# Patient Record
Sex: Male | Born: 1940
Health system: Southern US, Community
[De-identification: ages and names within clinical notes are randomized; demographics above are authoritative.]

## PROBLEM LIST (undated history)

## (undated) DIAGNOSIS — N529 Male erectile dysfunction, unspecified: Secondary | ICD-10-CM

## (undated) DIAGNOSIS — I447 Left bundle-branch block, unspecified: Secondary | ICD-10-CM

## (undated) DIAGNOSIS — Z9189 Other specified personal risk factors, not elsewhere classified: Secondary | ICD-10-CM

## (undated) DIAGNOSIS — I10 Essential (primary) hypertension: Secondary | ICD-10-CM

## (undated) DIAGNOSIS — R361 Hematospermia: Secondary | ICD-10-CM

## (undated) DIAGNOSIS — M545 Low back pain, unspecified: Secondary | ICD-10-CM

## (undated) DIAGNOSIS — N138 Other obstructive and reflux uropathy: Secondary | ICD-10-CM

## (undated) DIAGNOSIS — K648 Other hemorrhoids: Secondary | ICD-10-CM

## (undated) DIAGNOSIS — F329 Major depressive disorder, single episode, unspecified: Secondary | ICD-10-CM

## (undated) DIAGNOSIS — I2510 Atherosclerotic heart disease of native coronary artery without angina pectoris: Secondary | ICD-10-CM

## (undated) DIAGNOSIS — N401 Enlarged prostate with lower urinary tract symptoms: Secondary | ICD-10-CM

## (undated) DIAGNOSIS — C61 Malignant neoplasm of prostate: Secondary | ICD-10-CM

## (undated) DIAGNOSIS — K635 Polyp of colon: Secondary | ICD-10-CM

## (undated) DIAGNOSIS — F32A Depression, unspecified: Secondary | ICD-10-CM

## (undated) DIAGNOSIS — Q6101 Congenital single renal cyst: Secondary | ICD-10-CM

## (undated) DIAGNOSIS — Z86718 Personal history of other venous thrombosis and embolism: Secondary | ICD-10-CM

## (undated) DIAGNOSIS — K5792 Diverticulitis of intestine, part unspecified, without perforation or abscess without bleeding: Secondary | ICD-10-CM

## (undated) DIAGNOSIS — K220 Achalasia of cardia: Secondary | ICD-10-CM

## (undated) DIAGNOSIS — I6522 Occlusion and stenosis of left carotid artery: Secondary | ICD-10-CM

## (undated) DIAGNOSIS — Z9889 Other specified postprocedural states: Secondary | ICD-10-CM

## (undated) DIAGNOSIS — R351 Nocturia: Secondary | ICD-10-CM

## (undated) DIAGNOSIS — Z973 Presence of spectacles and contact lenses: Secondary | ICD-10-CM

## (undated) HISTORY — DX: Low back pain: M54.5

## (undated) HISTORY — DX: Diverticulitis of intestine, part unspecified, without perforation or abscess without bleeding: K57.92

## (undated) HISTORY — PX: HERNIA REPAIR: SHX51

## (undated) HISTORY — PX: COLONOSCOPY: SHX174

## (undated) HISTORY — DX: Essential (primary) hypertension: I10

## (undated) HISTORY — PX: CARDIOVASCULAR STRESS TEST: SHX262

## (undated) HISTORY — DX: Low back pain, unspecified: M54.50

## (undated) HISTORY — DX: Polyp of colon: K63.5

## (undated) HISTORY — DX: Depression, unspecified: F32.A

## (undated) HISTORY — DX: Hematospermia: R36.1

## (undated) HISTORY — DX: Atherosclerotic heart disease of native coronary artery without angina pectoris: I25.10

## (undated) HISTORY — DX: Major depressive disorder, single episode, unspecified: F32.9

## (undated) HISTORY — DX: Other hemorrhoids: K64.8

## (undated) HISTORY — DX: Malignant neoplasm of prostate: C61

---

## 2001-01-29 DIAGNOSIS — K648 Other hemorrhoids: Secondary | ICD-10-CM

## 2001-01-29 HISTORY — DX: Other hemorrhoids: K64.8

## 2001-04-18 ENCOUNTER — Ambulatory Visit (HOSPITAL_COMMUNITY): Admission: RE | Admit: 2001-04-18 | Discharge: 2001-04-18 | Payer: Self-pay | Admitting: Internal Medicine

## 2001-05-21 ENCOUNTER — Encounter: Payer: Self-pay | Admitting: General Surgery

## 2001-05-21 ENCOUNTER — Encounter: Admission: RE | Admit: 2001-05-21 | Discharge: 2001-05-21 | Payer: Self-pay | Admitting: General Surgery

## 2001-06-13 ENCOUNTER — Encounter: Payer: Self-pay | Admitting: General Surgery

## 2001-06-16 ENCOUNTER — Encounter: Payer: Self-pay | Admitting: General Surgery

## 2001-06-16 HISTORY — PX: LAPAROSCOPIC HELLER MYOTOMY: SUR780

## 2001-06-17 ENCOUNTER — Inpatient Hospital Stay (HOSPITAL_COMMUNITY): Admission: RE | Admit: 2001-06-17 | Discharge: 2001-06-18 | Payer: Self-pay | Admitting: General Surgery

## 2001-06-17 ENCOUNTER — Encounter: Payer: Self-pay | Admitting: General Surgery

## 2004-02-18 ENCOUNTER — Ambulatory Visit: Payer: Self-pay | Admitting: Internal Medicine

## 2004-06-30 ENCOUNTER — Ambulatory Visit: Payer: Self-pay | Admitting: Internal Medicine

## 2004-10-04 ENCOUNTER — Ambulatory Visit: Payer: Self-pay | Admitting: Internal Medicine

## 2004-10-10 ENCOUNTER — Ambulatory Visit: Payer: Self-pay | Admitting: Internal Medicine

## 2004-10-24 ENCOUNTER — Ambulatory Visit: Payer: Self-pay | Admitting: *Deleted

## 2004-11-29 ENCOUNTER — Ambulatory Visit: Payer: Self-pay | Admitting: Internal Medicine

## 2005-03-06 ENCOUNTER — Ambulatory Visit: Payer: Self-pay | Admitting: Internal Medicine

## 2005-03-14 ENCOUNTER — Ambulatory Visit (HOSPITAL_COMMUNITY): Admission: RE | Admit: 2005-03-14 | Discharge: 2005-03-14 | Payer: Self-pay | Admitting: Internal Medicine

## 2005-10-26 ENCOUNTER — Ambulatory Visit: Payer: Self-pay | Admitting: Internal Medicine

## 2005-11-11 HISTORY — PX: UMBILICAL HERNIA REPAIR: SHX196

## 2005-12-10 ENCOUNTER — Encounter: Admission: RE | Admit: 2005-12-10 | Discharge: 2005-12-10 | Payer: Self-pay | Admitting: General Surgery

## 2005-12-13 ENCOUNTER — Ambulatory Visit (HOSPITAL_BASED_OUTPATIENT_CLINIC_OR_DEPARTMENT_OTHER): Admission: RE | Admit: 2005-12-13 | Discharge: 2005-12-13 | Payer: Self-pay | Admitting: General Surgery

## 2006-03-06 ENCOUNTER — Ambulatory Visit: Payer: Self-pay | Admitting: Internal Medicine

## 2006-09-10 ENCOUNTER — Ambulatory Visit: Payer: Self-pay | Admitting: Endocrinology

## 2006-09-11 ENCOUNTER — Ambulatory Visit: Payer: Self-pay | Admitting: Endocrinology

## 2006-09-25 ENCOUNTER — Ambulatory Visit: Payer: Self-pay | Admitting: Internal Medicine

## 2006-11-28 ENCOUNTER — Telehealth: Payer: Self-pay | Admitting: Internal Medicine

## 2007-02-19 ENCOUNTER — Ambulatory Visit: Payer: Self-pay | Admitting: Internal Medicine

## 2007-02-19 DIAGNOSIS — N400 Enlarged prostate without lower urinary tract symptoms: Secondary | ICD-10-CM

## 2007-02-19 DIAGNOSIS — I10 Essential (primary) hypertension: Secondary | ICD-10-CM | POA: Insufficient documentation

## 2007-02-19 DIAGNOSIS — M545 Low back pain, unspecified: Secondary | ICD-10-CM | POA: Insufficient documentation

## 2007-03-25 ENCOUNTER — Ambulatory Visit: Payer: Self-pay | Admitting: Internal Medicine

## 2007-03-25 DIAGNOSIS — K649 Unspecified hemorrhoids: Secondary | ICD-10-CM

## 2007-03-25 DIAGNOSIS — K921 Melena: Secondary | ICD-10-CM

## 2007-04-01 ENCOUNTER — Ambulatory Visit: Payer: Self-pay | Admitting: Internal Medicine

## 2007-04-11 ENCOUNTER — Ambulatory Visit: Payer: Self-pay | Admitting: Endocrinology

## 2007-04-11 DIAGNOSIS — R21 Rash and other nonspecific skin eruption: Secondary | ICD-10-CM

## 2007-04-18 ENCOUNTER — Telehealth (INDEPENDENT_AMBULATORY_CARE_PROVIDER_SITE_OTHER): Payer: Self-pay | Admitting: *Deleted

## 2007-04-18 ENCOUNTER — Ambulatory Visit: Payer: Self-pay | Admitting: Internal Medicine

## 2007-04-18 ENCOUNTER — Encounter: Payer: Self-pay | Admitting: Internal Medicine

## 2007-04-22 ENCOUNTER — Ambulatory Visit: Payer: Self-pay | Admitting: Internal Medicine

## 2007-04-28 ENCOUNTER — Ambulatory Visit: Payer: Self-pay | Admitting: Internal Medicine

## 2007-04-28 DIAGNOSIS — F411 Generalized anxiety disorder: Secondary | ICD-10-CM | POA: Insufficient documentation

## 2007-04-29 ENCOUNTER — Ambulatory Visit: Payer: Self-pay | Admitting: Internal Medicine

## 2007-04-30 LAB — CONVERTED CEMR LAB
Basophils Relative: 1.2 % — ABNORMAL HIGH (ref 0.0–1.0)
CO2: 30 meq/L (ref 19–32)
Chloride: 106 meq/L (ref 96–112)
Cholesterol: 137 mg/dL (ref 0–200)
GFR calc Af Amer: 96 mL/min
GFR calc non Af Amer: 80 mL/min
Glucose, Bld: 98 mg/dL (ref 70–99)
HCT: 43.9 % (ref 39.0–52.0)
HDL: 27.9 mg/dL — ABNORMAL LOW (ref >39.0)
LDL Cholesterol: 93 mg/dL (ref 0–99)
Lymphocytes Relative: 36.9 % (ref 12.0–46.0)
Monocytes Relative: 10.4 % (ref 3.0–12.0)
Neutro Abs: 2.6 10*3/uL (ref 1.4–7.7)
RBC: 5.44 M/uL (ref 4.22–5.81)
RDW: 13.1 % (ref 11.5–14.6)
Total CHOL/HDL Ratio: 4.9

## 2007-05-05 ENCOUNTER — Telehealth: Payer: Self-pay | Admitting: Internal Medicine

## 2007-05-06 ENCOUNTER — Encounter: Payer: Self-pay | Admitting: Internal Medicine

## 2007-09-18 ENCOUNTER — Telehealth: Payer: Self-pay | Admitting: Internal Medicine

## 2008-05-10 ENCOUNTER — Encounter: Payer: Self-pay | Admitting: Internal Medicine

## 2008-06-29 ENCOUNTER — Ambulatory Visit: Payer: Self-pay | Admitting: Internal Medicine

## 2008-06-29 DIAGNOSIS — M542 Cervicalgia: Secondary | ICD-10-CM | POA: Insufficient documentation

## 2008-06-29 DIAGNOSIS — M25569 Pain in unspecified knee: Secondary | ICD-10-CM

## 2008-07-14 ENCOUNTER — Encounter: Payer: Self-pay | Admitting: Internal Medicine

## 2008-09-08 ENCOUNTER — Encounter: Payer: Self-pay | Admitting: Internal Medicine

## 2009-02-07 ENCOUNTER — Ambulatory Visit: Payer: Self-pay | Admitting: Internal Medicine

## 2009-02-07 DIAGNOSIS — IMO0001 Reserved for inherently not codable concepts without codable children: Secondary | ICD-10-CM

## 2009-02-09 LAB — CONVERTED CEMR LAB
ALT: 14 units/L (ref 0–53)
AST: 13 units/L (ref 0–37)
Basophils Relative: 0 % (ref 0.0–3.0)
Chloride: 107 meq/L (ref 96–112)
Creatinine, Ser: 0.9 mg/dL (ref 0.4–1.5)
Eosinophils Relative: 3 % (ref 0.0–5.0)
GFR calc non Af Amer: 108.29 mL/min (ref >60)
Glucose, Bld: 77 mg/dL (ref 70–99)
HCT: 45.2 % (ref 39.0–52.0)
Hemoglobin, Urine: NEGATIVE
Ketones, ur: NEGATIVE mg/dL
Lymphocytes Relative: 20 % (ref 12.0–46.0)
MCHC: 31.8 g/dL (ref 30.0–36.0)
MCV: 83.4 fL (ref 78.0–100.0)
RBC: 5.41 M/uL (ref 4.22–5.81)
Specific Gravity, Urine: >=1.03 (ref 1.000–1.030)
Total Bilirubin: 0.8 mg/dL (ref 0.3–1.2)
Total Protein: 7.7 g/dL (ref 6.0–8.3)
Urine Glucose: NEGATIVE mg/dL
Urobilinogen, UA: 0.2 (ref 0.0–1.0)

## 2009-03-02 ENCOUNTER — Encounter: Payer: Self-pay | Admitting: Internal Medicine

## 2009-09-19 ENCOUNTER — Encounter: Payer: Self-pay | Admitting: Internal Medicine

## 2010-01-26 ENCOUNTER — Ambulatory Visit
Admission: RE | Admit: 2010-01-26 | Discharge: 2010-01-26 | Payer: Self-pay | Source: Home / Self Care | Attending: Internal Medicine | Admitting: Internal Medicine

## 2010-01-26 ENCOUNTER — Encounter: Payer: Self-pay | Admitting: Internal Medicine

## 2010-01-26 DIAGNOSIS — J019 Acute sinusitis, unspecified: Secondary | ICD-10-CM

## 2010-02-28 NOTE — Assessment & Plan Note (Signed)
Summary: cough,achy/cd   Vital Signs:  Patient profile:   70 year old male Weight:      229 pounds Temp:     98.5 degrees F oral Pulse rate:   66 / minute BP sitting:   164 / 86  (left arm)  Vitals Entered By: Tora Perches (February 07, 2009 1:39 PM) CC: cough, aches Is Patient Diabetic? No   CC:  cough and aches.  History of Present Illness: The patient presents with complaints of HA, LBP, nosore throat. C/o low grade fever, Muscle aches are present x 3-4 d.   Preventive Screening-Counseling & Management  Alcohol-Tobacco     Smoking Status: never  Current Medications (verified): 1)  Loratadine 10 Mg  Tabs (Loratadine) .... Once Daily As Needed Allergies 2)  Lorazepam 0.5 Mg Tabs (Lorazepam) .Marland Kitchen.. 1 By Mouth Two Times A Day As Needed Anxiety 3)  Triamcinolone Acetonide 0.5 %  Crea (Triamcinolone Acetonide) .Marland Kitchen.. 1 By Mouth Two Times A Day As Needed For Rash 4)  Hytrin 5 Mg  Caps (Terazosin Hcl) .Marland Kitchen.. 1 By Mouth Qd 5)  Ibuprofen 600 Mg  Tabs (Ibuprofen) .Marland Kitchen.. 1 By Mouth Two Times A Day Pc X 2 Wks Then Prn  Allergies: 1)  ! * Iv Dye and All Other Dye 's  Past History:  Past Medical History: Last updated: 04/28/2007 Achalasia Hypertension Low back pain Benign prostatic hypertrophy Umbil. hernia Int hem.  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  Lungs:  Normal respiratory effort, chest expands symmetrically. Lungs are clear to auscultation, no crackles or wheezes. Heart:  Normal rate and regular rhythm. S1 and S2 normal without gallop, murmur, click, rub or other extra sounds. Abdomen:  Bowel sounds  positive,abdomen soft and non-tender without masses, organomegaly or hernias noted. Msk:  Lumbar-sacral spine is tender to palpation over paraspinal muscles and painfull with the ROM  Neurologic:  No cranial nerve deficits noted. Station and gait are normal. Plantar reflexes are down-going bilaterally. DTRs are symmetrical throughout. Sensory, motor and coordinative functions appear intact. Skin:  Intact without suspicious lesions or rashes Psych:  Cognition and judgment appear intact. Alert and cooperative with normal attention span and concentration. No apparent delusions, illusions, hallucinations   Impression & Recommendations:  Problem # 1:  MYALGIA (ICD-729.1) ?viral Assessment New  His updated medication list for this problem includes:    Ibuprofen 600 Mg Tabs (Ibuprofen) .Marland Kitchen... 1 by mouth two times a day pc x 2 wks then prn    Tramadol Hcl 50 Mg Tabs (Tramadol hcl) .Marland Kitchen... 1-2 tabs by mouth two times a day as needed pain  Orders: TLB-Udip ONLY (81003-UDIP) TLB-BMP (Basic Metabolic Panel-BMET) (80048-METABOL) TLB-Hepatic/Liver Function Pnl (80076-HEPATIC) TLB-CBC Platelet - w/Differential (85025-CBCD)  Problem # 2:  LOW BACK PAIN (ICD-724.2) r/o UTI  His updated medication list for this problem includes:    Ibuprofen 600 Mg Tabs (Ibuprofen) .Marland Kitchen... 1 by mouth two times a day pc x 2 wks then prn    Tramadol Hcl 50 Mg Tabs (Tramadol hcl) .Marland Kitchen... 1-2 tabs by mouth two times a day as needed pain  Orders: TLB-Udip ONLY (81003-UDIP) TLB-BMP (Basic Metabolic Panel-BMET) (80048-METABOL) TLB-Hepatic/Liver Function Pnl (80076-HEPATIC) TLB-CBC Platelet - w/Differential (85025-CBCD)  Problem #  3:  Poss. sinusitis Assessment: New Ceftin  Complete Medication List: 1)  Loratadine 10 Mg Tabs (Loratadine) .... Once daily as needed allergies 2)  Lorazepam 0.5 Mg Tabs (Lorazepam) .Marland Kitchen.. 1 by mouth two times a day as needed anxiety 3)  Triamcinolone Acetonide 0.5 % Crea (Triamcinolone acetonide)  .Marland Kitchen.. 1 by mouth two times a day as needed for rash 4)  Hytrin 5 Mg Caps (Terazosin hcl) .Marland Kitchen.. 1 by mouth qd 5)  Ibuprofen 600 Mg Tabs (Ibuprofen) .Marland Kitchen.. 1 by mouth two times a day pc x 2 wks then prn 6)  Ceftin 500 Mg Tabs (Cefuroxime axetil) .Marland Kitchen.. 1 by mouth bid 7)  Tramadol Hcl 50 Mg Tabs (Tramadol hcl) .Marland Kitchen.. 1-2 tabs by mouth two times a day as needed pain  Patient Instructions: 1)  Call if you are not better in a reasonable amount of time or if worse.  Prescriptions: TRAMADOL HCL 50 MG TABS (TRAMADOL HCL) 1-2 tabs by mouth two times a day as needed pain  #60 x 1   Entered and Authorized by:   Tresa Garter MD   Signed by:   Tresa Garter MD on 02/07/2009   Method used:   Electronically to        [facility] [address] [phone]* (retail)       [address]       [place], [place]  [address]       Ph: [phone] or [phone]       Fax: [phone]   RxID:   [id number] CEFTIN 500 MG TABS (CEFUROXIME AXETIL) 1 by mouth bid  #20 x 1   Entered and Authorized by:   Tresa Garter MD   Signed by:   Tresa Garter MD on 02/07/2009   Method used:   Electronically to        [facility] [address] [phone]* (retail)       [address]       [place], [place]  [address]       Ph: [phone] or [phone]       Fax: [phone]   RxID:   [id number]

## 2010-02-28 NOTE — Letter (Signed)
Summary: [facility]  [facility]   Imported By: Sherian Rein 03/08/2009 14:51:05  _____________________________________________________________________  External Attachment:    Type:   Image     Comment:   External Document

## 2010-02-28 NOTE — Letter (Signed)
Summary: [facility]   Imported By: Sherian Rein 09/27/2009 13:33:57  _____________________________________________________________________  External Attachment:    Type:   Image     Comment:   External Document

## 2010-03-02 NOTE — Assessment & Plan Note (Signed)
Summary: cough/cold worsening/plot full/cd   Vital Signs:  Patient profile:   70 year old male Height:      70 inches (177.80 cm) Weight:      230.50 pounds (104.77 kg) BMI:     33.19 O2 Sat:      94 % on Room air Temp:     98.9 degrees F (37.17 degrees C) oral Pulse rate:   71 / minute Pulse rhythm:   regular Resp:     16 per minute BP sitting:   142 / 80  (left arm) Cuff size:   large  Vitals Entered By: Brenton Grills CMA Duncan Dull) (January 26, 2010 10:34 AM)  Nutrition Counseling: Patient's BMI is greater than 25 and therefore counseled on weight management options.  O2 Flow:  Room air CC: Cold, congestion x 5 days/aj, URI symptoms Is Patient Diabetic? No Pain Assessment Patient in pain? no        Primary Care Provider:  Georgina Quint Plotnikov MD  CC:  Cold, congestion x 5 days/aj, and URI symptoms.  History of Present Illness:  URI Symptoms      This is a 70 year old man who presents with URI symptoms.  The symptoms began 1 week ago.  The severity is described as mild.  The patient reports nasal congestion, purulent nasal discharge, sore throat, dry cough, and sick contacts, but denies clear nasal discharge, productive cough, and earache.  The patient denies fever, stiff neck, dyspnea, wheezing, rash, vomiting, diarrhea, use of an antipyretic, and response to antipyretic.  The patient also reports headache.  The patient denies itchy throat, sneezing, seasonal symptoms, muscle aches, and severe fatigue.  Risk factors for Strep sinusitis include unilateral facial pain, unilateral nasal discharge, poor response to decongestant, and double sickening.  The patient denies the following risk factors for Strep sinusitis: tooth pain, Strep exposure, tender adenopathy, and absence of cough.    Preventive Screening-Counseling & Management  Alcohol-Tobacco     Alcohol drinks/day: 0     Alcohol Counseling: not indicated; patient does not drink     Smoking Status: never     Tobacco  Counseling: not indicated; no tobacco use  Caffeine-Diet-Exercise     Does Patient Exercise: no  Hep-HIV-STD-Contraception     Hepatitis Risk: no risk noted     HIV Risk: no risk noted     STD Risk: no risk noted      Sexual History:  currently monogamous.        Drug Use:  no.        Blood Transfusions:  no.    Clinical Review Panels:  Prevention   Last PSA:  1.70 (04/29/2007)  Lipid Management   Cholesterol:  137 (04/29/2007)   LDL (bad choesterol):  93 (04/29/2007)   HDL (good cholesterol):  27.9 (04/29/2007)  Diabetes Management   Creatinine:  0.9 (02/07/2009)  CBC   WBC:  9.0 (02/07/2009)   RBC:  5.41 (02/07/2009)   Hgb:  14.4 (02/07/2009)   Hct:  45.2 (02/07/2009)   Platelets:  222.0 (02/07/2009)   MCV  83.4 (02/07/2009)   MCHC  31.8 (02/07/2009)   RDW  13.5 (02/07/2009)   PMN:  70.0 (02/07/2009)   Lymphs:  20.0 (02/07/2009)   Monos:  7.0 (02/07/2009)   Eosinophils:  3.0 (02/07/2009)   Basophil:  0.0 H % (02/07/2009)  Complete Metabolic Panel   Glucose:  77 (02/07/2009)   Sodium:  141 (02/07/2009)   Potassium:  4.0 (02/07/2009)   Chloride:  107 (02/07/2009)   CO2:  29 (02/07/2009)   BUN:  9 (02/07/2009)   Creatinine:  0.9 (02/07/2009)   Albumin:  3.6 (02/07/2009)   Total Protein:  7.7 (02/07/2009)   Calcium:  9.0 (02/07/2009)   Total Bili:  0.8 (02/07/2009)   Alk Phos:  69 (02/07/2009)   SGPT (ALT):  14 (02/07/2009)   SGOT (AST):  13 (02/07/2009)   Medications Prior to Update: 1)  Loratadine 10 Mg  Tabs (Loratadine) .... Once Daily As Needed Allergies 2)  Lorazepam 0.5 Mg Tabs (Lorazepam) .Marland Kitchen.. 1 By Mouth Two Times A Day As Needed Anxiety 3)  Triamcinolone Acetonide 0.5 %  Crea (Triamcinolone Acetonide) .Marland Kitchen.. 1 By Mouth Two Times A Day As Needed For Rash 4)  Hytrin 5 Mg  Caps (Terazosin Hcl) .Marland Kitchen.. 1 By Mouth Qd 5)  Ibuprofen 600 Mg  Tabs (Ibuprofen) .Marland Kitchen.. 1 By Mouth Two Times A Day Pc X 2 Wks Then Prn 6)  Tramadol Hcl 50 Mg Tabs (Tramadol Hcl) .Marland Kitchen..  1-2 Tabs By Mouth Two Times A Day As Needed Pain  Current Medications (verified): 1)  Loratadine 10 Mg  Tabs (Loratadine) .... Once Daily As Needed Allergies 2)  Lorazepam 0.5 Mg Tabs (Lorazepam) .Marland Kitchen.. 1 By Mouth Two Times A Day As Needed Anxiety 3)  Triamcinolone Acetonide 0.5 %  Crea (Triamcinolone Acetonide) .Marland Kitchen.. 1 By Mouth Two Times A Day As Needed For Rash 4)  Hytrin 5 Mg  Caps (Terazosin Hcl) .Marland Kitchen.. 1 By Mouth Qd 5)  Ibuprofen 600 Mg  Tabs (Ibuprofen) .Marland Kitchen.. 1 By Mouth Two Times A Day Pc X 2 Wks Then Prn 6)  Tramadol Hcl 50 Mg Tabs (Tramadol Hcl) .Marland Kitchen.. 1-2 Tabs By Mouth Two Times A Day As Needed Pain 7)  Guiatuss Ac 100-10 Mg/27ml Syrp (Guaifenesin-Codeine) .... 5-10 Ml By Mouth Qid As Needed For Cough 8)  Ceftin 500 Mg Tab (Cefuroxime Axetil) .... Take One (1) Tablet By Mouth Two (2) Times A Day X 10 Days  Allergies (verified): 1)  ! * Iv Dye and All Other Dye 's  Past History:  Past Medical History: Last updated: 04/28/2007 Achalasia Hypertension Low back pain Benign prostatic hypertrophy Umbil. hernia Int hem.  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 Family History Hypertension  Social History: Reviewed history from 02/19/2007 and no changes required. 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    Physical Exam  General:  alert, well-developed, well-nourished, well-hydrated, appropriate dress, normal appearance, healthy-appearing, cooperative to examination, and good hygiene.   Head:  normocephalic, atraumatic, no abnormalities observed, and no abnormalities palpated.   Eyes:  vision grossly intact, pupils equal, pupils round, and pupils reactive to light.   Ears:  R ear normal  and L ear normal.   Nose:  no external deformity, no mucosal pallor, no mucosal edema, no airflow obstruction, no intranasal foreign body, no nasal polyps, no nasal mucosal lesions, no mucosal friability, no active bleeding or clots, no septum abnormalities, nasal dischargemucosal pallor, mucosal erythema, L maxillary sinus tenderness, and R maxillary sinus tenderness.   Mouth:  good dentition, pharynx pink and moist, no exudates, no posterior lymphoid hypertrophy, no postnasal drip, no pharyngeal crowing, no lesions, no aphthous ulcers, no erosions, no tongue abnormalities, no leukoplakia, no petechiae, and pharyngeal erythema.   Neck:  supple, full ROM, no masses, no thyromegaly, no thyroid nodules or tenderness, no JVD, no carotid bruits, no cervical lymphadenopathy, and no neck tenderness.   Lungs:  normal respiratory effort, no intercostal retractions, no accessory muscle use, normal breath sounds, no dullness, no fremitus, no crackles, and no wheezes.   Heart:  normal rate, regular rhythm, no murmur, no gallop, no rub, and no JVD.   Abdomen:  soft, non-tender, normal bowel sounds, no distention, no masses, no guarding, no rigidity, no rebound tenderness, no abdominal hernia, no inguinal hernia, no hepatomegaly, and no splenomegaly.   Msk:  No deformity or scoliosis noted  of thoracic or lumbar spine.   Pulses:  R and L carotid,radial,femoral,dorsalis pedis and posterior tibial pulses are full and equal bilaterally Extremities:  No clubbing, cyanosis, edema, or deformity noted with normal full range of motion of all joints.   Neurologic:  No cranial nerve deficits noted. Station and gait are normal. Plantar reflexes are down-going bilaterally. DTRs are symmetrical throughout. Sensory, motor and coordinative functions appear intact. Skin:  turgor normal, color normal, no rashes, no suspicious lesions, no ecchymoses, no petechiae, no purpura, no ulcerations, and no edema.   Cervical Nodes:  no anterior cervical adenopathy and no posterior cervical adenopathy.   Axillary Nodes:  no R axillary adenopathy and no L axillary adenopathy.   Psych:  Cognition and judgment appear intact. Alert and cooperative with normal attention span and concentration. No apparent delusions, illusions, hallucinations   Impression & Recommendations:  Problem # 1:  SINUSITIS- ACUTE-NOS (ICD-461.9) Assessment New  His updated medication list for this problem includes:    Guiatuss Ac 100-10 Mg/52ml Syrp (Guaifenesin-codeine) .Marland Kitchen... 5-10 ml by mouth qid as needed for cough    Ceftin 500 Mg Tab (Cefuroxime axetil) .Marland Kitchen... Take one (1) tablet by mouth two (2) times a day x 10 days  Instructed on treatment. Call if symptoms persist or worsen.   Problem # 2:  HYPERTENSION (ICD-401.9) Assessment: Unchanged  His updated medication list for this problem includes:    Hytrin 5 Mg Caps (Terazosin hcl) .Marland Kitchen... 1 by mouth qd  BP today: 142/80 Prior BP: 164/86 (02/07/2009)  Labs Reviewed: K+: 4.0 (02/07/2009) Creat: : 0.9 (02/07/2009)   Chol: 137 (04/29/2007)   HDL: 27.9 (04/29/2007)   LDL: 93 (04/29/2007)   TG: 79 (04/29/2007)  Complete Medication List: 1)  Loratadine 10 Mg Tabs (Loratadine) .... Once daily as needed allergies 2)  Lorazepam 0.5 Mg Tabs (Lorazepam) .Marland Kitchen.. 1 by mouth two times a day  as needed anxiety 3)  Triamcinolone Acetonide 0.5 % Crea (Triamcinolone acetonide) .Marland Kitchen.. 1 by mouth two times a day as needed for rash 4)  Hytrin 5 Mg Caps (Terazosin hcl) .Marland Kitchen.. 1 by mouth qd 5)  Ibuprofen 600 Mg Tabs (Ibuprofen) .Marland Kitchen.. 1 by mouth two times a day pc x 2 wks then prn 6)  Tramadol Hcl 50 Mg Tabs (Tramadol hcl) .Marland KitchenMarland KitchenMarland Kitchen  1-2 tabs by mouth two times a day as needed pain 7)  Guiatuss Ac 100-10 Mg/6ml Syrp (Guaifenesin-codeine) .... 5-10 ml by mouth qid as needed for cough 8)  Ceftin 500 Mg Tab (Cefuroxime axetil) .... Take one (1) tablet by mouth two (2) times a day x 10 days  Patient Instructions: 1)  Please schedule a follow-up appointment in 1 month. 2)  Check your Blood Pressure regularly. If it is above 140/90: you should make an appointment. 3)  Take your antibiotic as prescribed until ALL of it is gone, but stop if you develop a rash or swelling and contact our office as soon as possible. 4)  Acute sinusitis symptoms for less than 10 days are not helped by antibiotics.Use warm moist compresses, and over the counter decongestants ( only as directed). Call if no improvement in 5-7 days, sooner if increasing pain, fever, or new symptoms. Prescriptions: CEFTIN 500 MG TAB (CEFUROXIME AXETIL) Take one (1) tablet by mouth two (2) times a day X 10 days  #20 x 1   Entered and Authorized by:   Etta Grandchild MD   Signed by:   Etta Grandchild MD on 01/26/2010   Method used:   Print then Give to Patient   RxID:   [id number] GUIATUSS AC 100-10 MG/5ML SYRP (GUAIFENESIN-CODEINE) 5-10 ml by mouth QID as needed for cough  #8 ounces x 1   Entered and Authorized by:   Etta Grandchild MD   Signed by:   Etta Grandchild MD on 01/26/2010   Method used:   Print then Give to Patient   RxID:   [id number]    Orders Added: 1)  Est. Patient Level IV [01601]

## 2010-03-02 NOTE — Letter (Signed)
Summary: Out of Work  [facility]  [address] [place], [place] [address]   Phone: [phone]  Fax: [phone]    January 26, 2010   Employee:  [employer]    To Whom It May Concern:   For Medical reasons, please excuse the above named employee from work for the following dates:  Start:   01/26/10  End:   01/30/10  If you need additional information, please feel free to contact our office.         Sincerely,    Etta Grandchild MD

## 2010-03-29 ENCOUNTER — Encounter: Payer: Self-pay | Admitting: Internal Medicine

## 2010-04-11 NOTE — Letter (Signed)
[facility]: [facility]   Imported By: Sherian Rein 04/03/2010 14:12:22  _____________________________________________________________________  External Attachment:    Type:   Image     Comment:   External Document

## 2010-04-14 ENCOUNTER — Ambulatory Visit (INDEPENDENT_AMBULATORY_CARE_PROVIDER_SITE_OTHER): Payer: BC Managed Care – PPO | Admitting: Internal Medicine

## 2010-04-14 ENCOUNTER — Encounter: Payer: Self-pay | Admitting: Internal Medicine

## 2010-04-14 DIAGNOSIS — I10 Essential (primary) hypertension: Secondary | ICD-10-CM

## 2010-04-14 DIAGNOSIS — M79609 Pain in unspecified limb: Secondary | ICD-10-CM

## 2010-04-14 DIAGNOSIS — R609 Edema, unspecified: Secondary | ICD-10-CM | POA: Insufficient documentation

## 2010-04-18 ENCOUNTER — Other Ambulatory Visit: Payer: Self-pay | Admitting: Internal Medicine

## 2010-04-19 ENCOUNTER — Encounter (INDEPENDENT_AMBULATORY_CARE_PROVIDER_SITE_OTHER): Payer: BC Managed Care – PPO | Admitting: *Deleted

## 2010-04-19 ENCOUNTER — Ambulatory Visit: Payer: Self-pay

## 2010-04-19 ENCOUNTER — Other Ambulatory Visit: Payer: Self-pay | Admitting: Family Medicine

## 2010-04-19 DIAGNOSIS — M7989 Other specified soft tissue disorders: Secondary | ICD-10-CM

## 2010-04-19 DIAGNOSIS — M79609 Pain in unspecified limb: Secondary | ICD-10-CM

## 2010-04-24 ENCOUNTER — Telehealth: Payer: Self-pay | Admitting: Internal Medicine

## 2010-04-24 NOTE — Telephone Encounter (Signed)
I do not see the [name] report Pls give me dates and other info re: time off

## 2010-04-24 NOTE — Telephone Encounter (Signed)
Pt would like to get the results of the doppler scan and also speak to nurse about medical leave that was discussed w/MD.

## 2010-04-27 NOTE — Assessment & Plan Note (Signed)
Summary: LEG PAIN/#CD   Vital Signs:  Patient profile:   70 year old male Height:      70 inches Weight:      233 pounds BMI:     33.55 Temp:     98.1 degrees F oral Pulse rate:   76 / minute Pulse rhythm:   regular Resp:     16 per minute BP sitting:   152 / 88  (left arm) Cuff size:   large  Vitals Entered By: [name], CMA(AAMA) (April 14, 2010 8:13 AM) CC: tripped on wet pavement and injured Rt leg > 18mo ago. Worsening knee pain Is Patient Diabetic? No Comments pt states he only takes Hytrin regularly   Primary Care Provider:  Tresa Garter MD  CC:  tripped on wet pavement and injured Rt leg > 18mo ago. Worsening knee pain.  History of Present Illness: C/o HTN C/o R leg pain in R knee in the back after he fell 1.5 month ago - he slipped. No leg swelling.   Current Medications (verified): 1)  Loratadine 10 Mg  Tabs (Loratadine) .... Once Daily As Needed Allergies 2)  Lorazepam 0.5 Mg Tabs (Lorazepam) .Marland Kitchen.. 1 By Mouth Two Times A Day As Needed Anxiety 3)  Triamcinolone Acetonide 0.5 %  Crea (Triamcinolone Acetonide) .Marland Kitchen.. 1 By Mouth Two Times A Day As Needed For Rash 4)  Hytrin 5 Mg  Caps (Terazosin Hcl) .Marland Kitchen.. 1 By Mouth Qd 5)  Ibuprofen 600 Mg  Tabs (Ibuprofen) .Marland Kitchen.. 1 By Mouth Two Times A Day Pc X 2 Wks Then Prn 6)  Tramadol Hcl 50 Mg Tabs (Tramadol Hcl) .Marland Kitchen.. 1-2 Tabs By Mouth Two Times A Day As Needed Pain 7)  Guiatuss Ac 100-10 Mg/38ml Syrp (Guaifenesin-Codeine) .... 5-10 Ml By Mouth Qid As Needed For Cough  Allergies (verified): 1)  ! * Iv Dye and All Other Dye 's  Past History:  Past Medical History: Last updated: 04/28/2007 Achalasia Hypertension Low back pain Benign prostatic hypertrophy Umbil. hernia Int hem.  Colon 2003, 2009 Dr Marina Goodell  Social History: Last updated: 01/26/2010 Occupation: HR Married Never Smoked Alcohol use-no Drug use-no Regular exercise-no  Review of Systems  The patient denies anorexia, chest pain, syncope, dyspnea  on exertion, and abdominal pain.    Physical Exam  General:  alert, well-developed, well-nourished, well-hydrated, appropriate dress, normal appearance, healthy-appearing, cooperative to examination, and good hygiene.   Nose:  no external deformity, no mucosal pallor, no mucosal edema, no airflow obstruction, no intranasal foreign body, no nasal polyps, no nasal mucosal lesions, no mucosal friability, no active bleeding or clots, no septum abnormalities, nasal dischargemucosal pallor, mucosal erythema, L maxillary sinus tenderness, and R maxillary sinus tenderness.   Mouth:  good dentition, pharynx pink and moist, no exudates, no posterior lymphoid hypertrophy, no postnasal drip, no pharyngeal crowing, no lesions, no aphthous ulcers, no erosions, no tongue abnormalities, no leukoplakia, no petechiae, and pharyngeal erythema.   Lungs:  normal respiratory effort, no intercostal retractions, no accessory muscle use, normal breath sounds, no dullness, no fremitus, no crackles, and no wheezes.   Heart:  normal rate, regular rhythm, no murmur, no gallop, no rub, and no JVD.   Abdomen:  soft, non-tender, normal bowel sounds, no distention, no masses, no guarding, no rigidity, no rebound tenderness, no abdominal hernia, no inguinal hernia, no hepatomegaly, and no splenomegaly.   Msk:  No deformity or scoliosis noted of thoracic or lumbar spine.   Calves NT B Extremities:  No clubbing,  cyanosis, edema, or deformity noted with normal full range of motion of all joints.   Neurologic:  No cranial nerve deficits noted. Station and gait are normal. Plantar reflexes are down-going bilaterally. DTRs are symmetrical throughout. Sensory, motor and coordinative functions appear intact. Skin:  turgor normal, color normal, no rashes, no suspicious lesions, no ecchymoses, no petechiae, no purpura, no ulcerations, and no edema.     Impression & Recommendations:  Problem # 1:  KNEE PAIN (ICD-719.46) R Assessment  New  His updated medication list for this problem includes:    Ibuprofen 600 Mg Tabs (Ibuprofen) .Marland Kitchen... 1 by mouth two times a day pc x 2 wks then prn    Tramadol Hcl 50 Mg Tabs (Tramadol hcl) .Marland Kitchen... 1-2 tabs by mouth two times a day as needed pain    Ibuprofen 400 Mg Tabs (Ibuprofen) .Marland Kitchen... 1 by mouth two times a day pc prn Sonography Report: Indication:swelling and pain behind R knee Clinical bedside ultrasound was performed using Terason 3000 machine with a linear transducer.  The images were stored in Terason.  There was no Baker's cyst noted. Doppler revieled compressible tortuous veins Impression: no Baker's cyst  Problem # 2:  HYPERTENSION (ICD-401.9) Assessment: Deteriorated  His updated medication list for this problem includes:    Hytrin 5 Mg Caps (Terazosin hcl) .Marland Kitchen... 1 by mouth qd    Losartan Potassium 100 Mg Tabs (Losartan potassium) .Marland Kitchen... 1 by mouth once daily for blood pressure  Problem # 3:  STRESS DISORDER (ICD-V62.89) Assessment: Deteriorated Discussed  Complete Medication List: 1)  Loratadine 10 Mg Tabs (Loratadine) .... Once daily as needed allergies 2)  Lorazepam 0.5 Mg Tabs (Lorazepam) .Marland Kitchen.. 1 by mouth two times a day as needed anxiety 3)  Triamcinolone Acetonide 0.5 % Crea (Triamcinolone acetonide) .Marland Kitchen.. 1 by mouth two times a day as needed for rash 4)  Hytrin 5 Mg Caps (Terazosin hcl) .Marland Kitchen.. 1 by mouth qd 5)  Ibuprofen 600 Mg Tabs (Ibuprofen) .Marland Kitchen.. 1 by mouth two times a day pc x 2 wks then prn 6)  Tramadol Hcl 50 Mg Tabs (Tramadol hcl) .Marland Kitchen.. 1-2 tabs by mouth two times a day as needed pain 7)  Guiatuss Ac 100-10 Mg/64ml Syrp (Guaifenesin-codeine) .... 5-10 ml by mouth qid as needed for cough 8)  Losartan Potassium 100 Mg Tabs (Losartan potassium) .Marland Kitchen.. 1 by mouth once daily for blood pressure 9)  Ibuprofen 400 Mg Tabs (Ibuprofen) .Marland Kitchen.. 1 by mouth two times a day pc prn  Other Orders: Doppler Referral (Doppler) EMR Misc Charge Code [facility])  Patient  Instructions: 1)  Please schedule a follow-up appointment in 2 months. Prescriptions: IBUPROFEN 400 MG TABS (IBUPROFEN) 1 by mouth two times a day pc prn  #60 x 1   Entered and Authorized by:   Tresa Garter MD   Signed by:   Tresa Garter MD on 04/14/2010   Method used:   Print then Give to Patient   RxID:   [id number] LOSARTAN POTASSIUM 100 MG TABS (LOSARTAN POTASSIUM) 1 by mouth once daily for blood pressure  #30 x 12   Entered and Authorized by:   Tresa Garter MD   Signed by:   Tresa Garter MD on 04/14/2010   Method used:   Print then Give to Patient   RxID:   [id number]    Orders Added: 1)  Doppler Referral [Doppler] 2)  Est. Patient Level IV [54627] 3)  EMR Misc Charge Code [EMRMisc]

## 2010-04-28 NOTE — Telephone Encounter (Signed)
[name] was good - no clots

## 2010-04-28 NOTE — Telephone Encounter (Signed)
Had [facility] fax the [name] results over/Would not show in EPIC on 04/27/10.Jovita Gamma results to MD's nurse this AM for MD review & signature/will send to med records to be scanned.

## 2010-05-01 NOTE — Telephone Encounter (Signed)
LMOM that tests results were good, if any further questions or concerns to call office. Also apologized for the wait to get results.

## 2010-05-03 ENCOUNTER — Encounter: Payer: Self-pay | Admitting: Internal Medicine

## 2010-05-08 ENCOUNTER — Other Ambulatory Visit: Payer: Self-pay | Admitting: Urology

## 2010-05-08 DIAGNOSIS — Q6100 Congenital renal cyst, unspecified: Secondary | ICD-10-CM

## 2010-05-08 NOTE — Telephone Encounter (Signed)
Pt aware of results. He is req a call from MD directly - He is going to see radiologist to aspirate cyst on his left kidney. Pt wants to take medical leave but would like to discuss w/MD prior to putting in for this b/c insurance company will be contacting Dr [employer] with questions.   Pt's # is [phone]

## 2010-05-09 ENCOUNTER — Encounter: Payer: Self-pay | Admitting: Internal Medicine

## 2010-05-09 NOTE — Telephone Encounter (Signed)
Renal cyst will be removed in May - dr Fredia Sorrow C/o fatigue, stress, insomnia, anxiety 4/23-5/23  OK - note is done

## 2010-05-16 ENCOUNTER — Encounter: Payer: Self-pay | Admitting: Internal Medicine

## 2010-05-16 ENCOUNTER — Telehealth: Payer: Self-pay | Admitting: Internal Medicine

## 2010-05-16 NOTE — Telephone Encounter (Signed)
Pt request that previously requested note for work [see 04/24/10 telephone note] have dates for 'out of work' to be changed to: 05/29/2010 - 06/28/2010 Per VO Dr Posey Rea, OK to rewrite letter w/new dates. Will contact Pt on mobile phone when letter is re-written and ready for p/u at office.  OK to [facility] per Pt request.

## 2010-05-23 ENCOUNTER — Ambulatory Visit
Admission: RE | Admit: 2010-05-23 | Discharge: 2010-05-23 | Disposition: A | Discharge status: Home / Self Care | Payer: BC Managed Care – PPO | Source: Ambulatory Visit | Attending: Urology | Admitting: Urology

## 2010-05-23 VITALS — BP 145/75 | HR 68 | Temp 98.2°F | Resp 16 | Ht 70.0 in | Wt 233.0 lb

## 2010-05-23 DIAGNOSIS — Q6100 Congenital renal cyst, unspecified: Secondary | ICD-10-CM

## 2010-05-26 ENCOUNTER — Telehealth: Payer: Self-pay | Admitting: Internal Medicine

## 2010-05-26 NOTE — Telephone Encounter (Signed)
Spoke w/Pt this morning concerning his short term disability w/ [employer]. They should be faxing over forms to be reviewed and completed concerning his time out of work which we wrote him a letter for from 05/29/10 thru 06/28/10. Pt states that he has informed Insurance about his problems w/back, knee, stress and/or fatigue; but he has not mentioned the pending procedure w/Dr Reunion. He would like me to contact him when we have recvd these forms.  He has decided to go ahead w/the procedure w/ Dr. Alex Gardener the kidney] after having a consult visit with him on Tuesday which he described as a "good discussion". He would like to get your recommendation and opinion on this decision, as he stated that "your opinion means a lot to him" and he would like to "keep you in the loop" w/all of his medical care. He is suppose to be contacted next week about setting a date for the procedure.  He can be reached at his cell phone #[phone]

## 2010-05-30 ENCOUNTER — Other Ambulatory Visit (HOSPITAL_COMMUNITY): Payer: Self-pay | Admitting: Interventional Radiology

## 2010-05-30 DIAGNOSIS — N281 Cyst of kidney, acquired: Secondary | ICD-10-CM

## 2010-06-02 ENCOUNTER — Other Ambulatory Visit (HOSPITAL_COMMUNITY): Payer: Self-pay | Admitting: Interventional Radiology

## 2010-06-02 DIAGNOSIS — N281 Cyst of kidney, acquired: Secondary | ICD-10-CM

## 2010-06-02 DIAGNOSIS — Z0279 Encounter for issue of other medical certificate: Secondary | ICD-10-CM

## 2010-06-06 ENCOUNTER — Ambulatory Visit (INDEPENDENT_AMBULATORY_CARE_PROVIDER_SITE_OTHER)
Admission: RE | Admit: 2010-06-06 | Discharge: 2010-06-06 | Disposition: A | Discharge status: Home / Self Care | Payer: BC Managed Care – PPO | Source: Ambulatory Visit | Attending: Internal Medicine | Admitting: Internal Medicine

## 2010-06-06 ENCOUNTER — Ambulatory Visit (INDEPENDENT_AMBULATORY_CARE_PROVIDER_SITE_OTHER): Payer: BC Managed Care – PPO | Admitting: Internal Medicine

## 2010-06-06 ENCOUNTER — Encounter: Payer: Self-pay | Admitting: Internal Medicine

## 2010-06-06 ENCOUNTER — Telehealth: Payer: Self-pay | Admitting: Internal Medicine

## 2010-06-06 VITALS — BP 138/72 | HR 64 | Temp 97.9°F | Wt 233.0 lb

## 2010-06-06 DIAGNOSIS — I10 Essential (primary) hypertension: Secondary | ICD-10-CM

## 2010-06-06 DIAGNOSIS — M25569 Pain in unspecified knee: Secondary | ICD-10-CM

## 2010-06-06 DIAGNOSIS — F439 Reaction to severe stress, unspecified: Secondary | ICD-10-CM | POA: Insufficient documentation

## 2010-06-06 DIAGNOSIS — N281 Cyst of kidney, acquired: Secondary | ICD-10-CM

## 2010-06-06 DIAGNOSIS — Z733 Stress, not elsewhere classified: Secondary | ICD-10-CM

## 2010-06-06 NOTE — Assessment & Plan Note (Signed)
Procedure - US guided aspiration next Fri

## 2010-06-06 NOTE — Patient Instructions (Signed)
Call if problems 

## 2010-06-06 NOTE — Telephone Encounter (Signed)
Dominic Shaffer, please, inform patient that his knee xray normal except for a small amount of fluid in the joint. Take Ibuprofen x 2 wks (wait 4 - 5 d after procedure)

## 2010-06-06 NOTE — Assessment & Plan Note (Signed)
On Rx 

## 2010-06-06 NOTE — Progress Notes (Signed)
  Subjective:    Patient ID: Dominic Shaffer, male    DOB: August 10, 1940, 70 y.o.   MRN: [id number]  HPI C/o R knee pain worse in am and occ. Swelling at night F/u kidney cyst F/u LBP C/o HA off and on x 10 d or so Review of Systems  Constitutional: Negative for chills.  HENT: Negative for nosebleeds and sneezing.   Respiratory: Negative for shortness of breath.   Genitourinary: Negative for testicular pain.  Musculoskeletal: Positive for back pain (R sided).  Psychiatric/Behavioral: Negative for confusion. The patient is nervous/anxious.        Objective:   Physical Exam  Constitutional: He is oriented to person, place, and time. He appears well-developed.  HENT:  Mouth/Throat: Oropharynx is clear and moist.  Eyes: Conjunctivae are normal. Pupils are equal, round, and reactive to light.  Neck: Normal range of motion. No JVD present. No thyromegaly present.  Cardiovascular: Normal rate, regular rhythm, normal heart sounds and intact distal pulses.  Exam reveals no gallop and no friction rub.   No murmur heard. Pulmonary/Chest: Effort normal and breath sounds normal. No respiratory distress. He has no wheezes. He has no rales. He exhibits no tenderness.  Abdominal: Soft. Bowel sounds are normal. He exhibits no distension and no mass. There is no tenderness. There is no rebound and no guarding.  Musculoskeletal: Normal range of motion. He exhibits no edema and no tenderness.  Lymphadenopathy:    He has no cervical adenopathy.  Neurological: He is alert and oriented to person, place, and time. He has normal reflexes. No cranial nerve deficit. He exhibits normal muscle tone. Coordination normal.  Skin: Skin is warm and dry. No rash noted.  Psychiatric: He has a normal mood and affect. His behavior is normal. Judgment and thought content normal.          Assessment & Plan:  HYPERTENSION On Rx  Stress Stay off work - see forms.  Cyst, kidney, acquired Procedure - US guided  aspiration next Fri    Headache - new, ?tension  CT if not better. Try Losartan at hs, try 1/2 tab

## 2010-06-06 NOTE — Assessment & Plan Note (Signed)
Stay off work - see forms.

## 2010-06-07 NOTE — Telephone Encounter (Signed)
Left mess for patient to call back.  

## 2010-06-07 NOTE — Telephone Encounter (Signed)
Pt informed

## 2010-06-09 ENCOUNTER — Other Ambulatory Visit: Payer: Self-pay | Admitting: Interventional Radiology

## 2010-06-09 ENCOUNTER — Ambulatory Visit (HOSPITAL_COMMUNITY)
Admission: RE | Admit: 2010-06-09 | Discharge: 2010-06-09 | Disposition: A | Discharge status: Home / Self Care | Payer: BC Managed Care – PPO | Source: Ambulatory Visit | Attending: Interventional Radiology | Admitting: Interventional Radiology

## 2010-06-09 ENCOUNTER — Other Ambulatory Visit (HOSPITAL_COMMUNITY): Payer: Self-pay | Admitting: Interventional Radiology

## 2010-06-09 ENCOUNTER — Ambulatory Visit (HOSPITAL_COMMUNITY): Payer: BC Managed Care – PPO

## 2010-06-09 DIAGNOSIS — Z01812 Encounter for preprocedural laboratory examination: Secondary | ICD-10-CM | POA: Insufficient documentation

## 2010-06-09 DIAGNOSIS — N281 Cyst of kidney, acquired: Secondary | ICD-10-CM | POA: Insufficient documentation

## 2010-06-09 LAB — CBC
Hemoglobin: 14.8 g/dL (ref 13.0–17.0)
MCH: 26.1 pg (ref 26.0–34.0)
MCHC: 32.8 g/dL (ref 30.0–36.0)
MCV: 79.4 fL (ref 78.0–100.0)
RBC: 5.68 MIL/uL (ref 4.22–5.81)

## 2010-06-11 ENCOUNTER — Other Ambulatory Visit: Payer: Self-pay | Admitting: Internal Medicine

## 2010-06-12 NOTE — Telephone Encounter (Signed)
Ok to Rf? 

## 2010-06-13 ENCOUNTER — Encounter: Payer: Self-pay | Admitting: Internal Medicine

## 2010-06-13 ENCOUNTER — Ambulatory Visit (INDEPENDENT_AMBULATORY_CARE_PROVIDER_SITE_OTHER): Payer: BC Managed Care – PPO | Admitting: Internal Medicine

## 2010-06-13 DIAGNOSIS — I10 Essential (primary) hypertension: Secondary | ICD-10-CM

## 2010-06-13 DIAGNOSIS — N281 Cyst of kidney, acquired: Secondary | ICD-10-CM

## 2010-06-13 DIAGNOSIS — F411 Generalized anxiety disorder: Secondary | ICD-10-CM

## 2010-06-13 DIAGNOSIS — F439 Reaction to severe stress, unspecified: Secondary | ICD-10-CM

## 2010-06-13 DIAGNOSIS — R079 Chest pain, unspecified: Secondary | ICD-10-CM

## 2010-06-13 DIAGNOSIS — I454 Nonspecific intraventricular block: Secondary | ICD-10-CM | POA: Insufficient documentation

## 2010-06-13 DIAGNOSIS — Z733 Stress, not elsewhere classified: Secondary | ICD-10-CM

## 2010-06-13 NOTE — Assessment & Plan Note (Signed)
New - I'm not able to locate ol  EKGs. Asymptomatic.

## 2010-06-13 NOTE — Progress Notes (Signed)
  Subjective:    Patient ID: Dominic Shaffer, male    DOB: 30-Jun-1940, 70 y.o.   MRN: [id number]  HPI  C/o abn telem w/BBB during his renal cyst aspiration. No Sx - no CP, SOB, LOC. Overall feeling better....  Review of Systems  Constitutional: Negative for activity change and unexpected weight change.  Respiratory: Negative for apnea, cough, choking, chest tightness, shortness of breath, wheezing and stridor.   Cardiovascular: Negative for chest pain, palpitations and leg swelling.  Genitourinary: Negative for dysuria.  Neurological: Negative for dizziness, tremors, syncope, weakness, light-headedness and numbness.       Objective:   Physical Exam  Constitutional: He is oriented to person, place, and time. He appears well-developed.  HENT:  Mouth/Throat: Oropharynx is clear and moist.  Eyes: Conjunctivae are normal. Pupils are equal, round, and reactive to light.  Neck: Normal range of motion. No JVD present. No thyromegaly present.  Cardiovascular: Normal rate, regular rhythm, normal heart sounds and intact distal pulses.  Exam reveals no gallop and no friction rub.   No murmur heard. Pulmonary/Chest: Effort normal and breath sounds normal. No respiratory distress. He has no wheezes. He has no rales. He exhibits no tenderness.  Abdominal: Soft. Bowel sounds are normal. He exhibits no distension and no mass. There is no tenderness. There is no rebound and no guarding.  Musculoskeletal: Normal range of motion. He exhibits no edema and no tenderness.  Lymphadenopathy:    He has no cervical adenopathy.  Neurological: He is alert and oriented to person, place, and time. He has normal reflexes. No cranial nerve deficit. He exhibits normal muscle tone. Coordination normal.  Skin: Skin is warm and dry. No rash noted.  Psychiatric: He has a normal mood and affect. His behavior is normal. Judgment and thought content normal.          Assessment & Plan:     ANXIETY Worse -  situational. Stay off work  HYPERTENSION On Rx  BP Readings from Last 3 Encounters:  06/13/10 140/80  06/06/10 138/72  05/23/10 145/75     Cyst, kidney, acquired Guided aspiration is pending  Stress Discussed - remain off work

## 2010-06-16 NOTE — Op Note (Signed)
NAME:  Dominic Shaffer, Dominic Shaffer NO.:  [id number]   MEDICAL RECORD NO.:  [id number]          PATIENT TYPE:  AMB   LOCATION:  [facility]                         FACILITY:  [facility]   PHYSICIAN:  Adolph Pollack, M.D.DATE OF BIRTH:  08/24/1940   DATE OF PROCEDURE:  11/11/2005  DATE OF DISCHARGE:                                 OPERATIVE REPORT   PREOPERATIVE DIAGNOSIS:  Umbilical hernia.   POSTOPERATIVE DIAGNOSIS:  Umbilical hernia.   PROCEDURE:  Umbilical hernia repair with mesh.   SURGEON:  Dr. Abbey Chatters   ANESTHESIA:  General.   INDICATIONS:  This is a 70 year old male who was having some discomfort in  the periumbilical region and swelling.  He was found to have an umbilical  hernia.  He now presents for repair.  I have discussed the procedure risks  and aftercare with him.   TECHNIQUE:  He is seen in the holding area then brought to the operating  room, placed supine on the operating table, and a general anesthetic was  administered.  The hair in the periumbilical area was clipped.  The area was  then sterilely prepped and draped.  Dilute Marcaine solution was infiltrated  in the periumbilical region superficially and deep.  A curvilinear  subumbilical incision was made through the skin and subcutaneous tissue down  to the fascial level.  The umbilicus was dissected off the fascia exposing  an underlying fascial defect of approximately 2 cm.  Small bowel was noted  to be just below the defect, and no injury to small bowel was noted.  I  dissected subcutaneous tissue off the fascia for a distance of 3-4 cm around  the primary defect.  I then primarily closed the hernia defect with  interrupted 0 Surgilon sutures.  A piece of polypropylene mesh was brought  into the field, and the sutures were threaded through the mesh and then tied  down, anchoring the mesh directly over the primary repair.  The periphery of  the mesh was then anchored to the fascia with a running 0  Prolene suture for  a 3 cm overlap in all directions.  Excess mesh was trimmed.   Following this, I injected Marcaine solution into the fascia.  Hemostasis  was adequate.  The umbilicus was reimplanted onto the mesh with a 3-0 Vicryl  suture.  The subcutaneous tissue was closed over the mesh with a running 3-0  Vicryl suture.  The skin was closed with a 4-0 Monocryl subcuticular stitch.  Steri-Strips and a sterile dressing were applied.   He tolerated procedure well without apparent complications and was taken to  the recovery room in satisfactory condition.      Adolph Pollack, M.D.  Electronically Signed     TJR/MEDQ  D:  12/13/2005  T:  12/13/2005  Job:  [id number]   cc:   Georgina Quint. Plotnikov, MD  [address]  [place]  [place] [address]

## 2010-06-16 NOTE — Op Note (Signed)
[facility]  Patient:    Dominic Shaffer, Dominic Shaffer Visit Number: [id number] MRN: [id number]          Service Type: SUR Location: 4W [id number] [id number] Attending Physician:  Arlis Porta Dictated by:   Sandria Bales. Ezzard Standing, M.D. Proc. Date: 06/16/01 Admit Date:  06/16/2001 Discharge Date: 06/18/2001   CC:         Adolph Pollack, M.D.  Wilhemina Bonito. Eda Keys., M.D. [facility]   Operative Report  DATE OF BIRTH:  08-19-1940  PREOPERATIVE DIAGNOSIS:  Acolasia with sigmoid esophagus.  POSTOPERATIVE DIAGNOSIS:  Acolasia with sigmoid esophagus.  PROCEDURE:  Upper esophagogastroscopy with fluoroscopy assistance.  SURGEON:  Sandria Bales. Ezzard Standing, M.D.  FIRST ASSISTANT:  Adolph Pollack, M.D.  ANESTHESIA:  General.  INDICATIONS FOR PROCEDURE:  [name] is a patient of Dr. Yancey Flemings and Dr. Avel Peace who has acolasia. He is currently undergoing a laparoscopic Heller myotomy for acolasia. Dr. Abbey Chatters has done a laparoscopic Heller myotomy now doing an upper esophagogastroscopy for documentation of the patency and thoroughness of the myotomy.  DESCRIPTION OF PROCEDURE:  The flexible Olympus endoscope was passed with the patient under general anesthesia. He had an endotracheal tube in place. The scope was passed without difficulty through the cricopharyngeus but upon entering the stomach, the patient had a large amount of retained food particles and material. It actually was fairly foul smelling and took some amount of time, probably some 30 minutes, to clean and irrigated out enough of the esophagus to be able to advance the scope down. The esophagus was tortuous and went into the right chest and then a made fairly severe angulation towards the GE junction. I was not able to advance the scope easily because of the dilatation and tortuosity of the esophagus.  Therefore we brought in a fluoroscopy and fluoroscoped the patient with the scope in the  esophagus. There was a clear angulation of the scope going down the esophagus and the esophagoscope was seen in the right chest and then the scope angulated to the left side. Under fluoroscopy then I was able to advance the scope also with direct intraluminal visualization through the GE junction into the stomach. I thought the myotomy was widely patent and that our endoscope was about 1.2 cm in diameter and this passed easily into the stomach. It was very hard, however, because of the tortuosity to withdraw and visualize the exact GE junction though I am guessing this was about 40-42 cm. It appeared that Dr. Abbey Chatters had the myotomy down at least 1 1/2 to 2 cm on the gastric side of the GE junction.  The patient had a normal appearing stomach and mucosa. There was no abnormality or tumor of the stomach. The esophagus again was coated with food particles and thick mucus and I was unable to see the mucous membranes very well but what I cleaned off and saw all appeared grossly normal without any again tumor or mass noted.  The scope was then withdrawn. The scope was used primarily to evaluate the completeness of the Heller myotomy. Pictures were taken under fluoro documenting the location of the endoscope. Dictated by:   Sandria Bales. Ezzard Standing, M.D. Attending Physician:  Arlis Porta DD:  06/16/01 TD:  06/18/01 Job: [id number]

## 2010-06-16 NOTE — Op Note (Signed)
[facility]  Patient:    Dominic Shaffer, Dominic Shaffer Visit Number: [id number] MRN: [id number]          Service Type: SUR Location: 4W [id number] Attending Physician:  Arlis Porta Dictated by:   Dominic Shaffer, M.D. Proc. Date: 06/16/01 Admit Date:  06/16/2001 Discharge Date: 06/18/2001   CC:         Dominic Shaffer. Eda Keys., M.D. [facility]  Desma Maxim, M.D.   Operative Report  PREOPERATIVE DIAGNOSIS:  Acolasia.  POSTOPERATIVE DIAGNOSIS:  Acolasia.  PROCEDURES:  1. Laparoscopic Heller myotomy.  2. Intraoperative upper endoscopy with fluoroscopic guidance (by Dr. Ovidio Kin).  SURGEON:  Dominic Shaffer, M.D.  ASSISTANT:  Sandria Bales. Ezzard Standing, M.D.  ANESTHESIA:  General.  INDICATIONS FOR PROCEDURE:  Dominic Shaffer is a 70 year old male with longstanding history of acolasia. He has undergone esophageal dilatations at [facility] as well as botox injections. He has recurrent symptoms. A recent endoscopy done by Dr. Marina Goodell demonstrated a markedly dilated fluid filled esophagus with stasis changes. He was unable to complete the endoscopy because of all the retained food. He presented to my office to discuss operative management and I suspected laparoscopic Heller myotomy. He decided to proceed with this. The procedure and risks were explained to him extensively preoperatively.  TECHNIQUE:  He was brought to the operating room, placed supine on the operating table, and a general anesthetic was administered. A previously demonstrated umbilical hernia was easily reducible. I explained to him preoperatively that I would likely not tend to this at this time as it was easily reducible now once he was asleep. His abdomen was sterilely prepped and draped. I made a small epigastric incision 1/3 of the way between the umbilicus and the pubis incising the skin and subcutaneous tissue sharply. The midline fascia was identified and a small incision made in  it. A pursestring suture of #0 Vicryl was placed around the fascial edges and then the peritoneal cavity was entered bluntly and under direct vision. A Hasson trocar was introduced into the peritoneal cavity and a pneumoperitoneum was then created by insufflation of CO2 gas. Next, a 30 degree laparoscope was introduced. The liver appeared to be slightly enlarged. Under direct vision, a 5 mm trocar was placed in the right mid abdomen after the patient was placed in the steep reverse Trendelenburg position. I placed a small liver retractor this, triangulated it and placed it under the left lobe of the liver but this did not allow me enough visualization. I subsequently removed the 5 mm liver retractor and made a larger incision through which I inserted a 12 mm trocar through. A larger fan shaped/paddle retractor was inserted and this was placed under the left lobe of the liver and retracted anteriorly allowing adequate exposure of the gastroesophageal junction.  Next, a 5 mm trocar was placed in the left upper quadrant. A 5 mm trocar was placed in the right upper quadrant. An 11 mm trocar was placed in the epigastric region just to the right of the midline. A final midline trocar was placed in the right upper quadrant medial aspect.  The dissection began by dividing the thin gastrohepatic ligament up to the level of the crus and dividing the thin material anterior to the esophagus with the harmonic scalpel. Using blunt dissection, I then identified the right crus and separated the esophagus from it. I identified the anterior and posterior vagus nerves and these were tied long. I created a  retroesophageal window with careful blunt dissection and placed a Penrose drain around it. I then mobilized the esophagus well up into the mediastinum both anteriorly and posteriorly. I could see the dilated esophagus in the mediastinum.  Next, using low cautery and the hook Bovie, I began dividing  longitudinal fibers in the esophagus above the GE junction. I then identified underlying circular fibers and divided these part of the time just avulsing and part of the time using careful cautery. I made sure the circular fibers were lifted well above the mucosa. I carried this approximately 4-5 cm proximally on the esophagus and then distally I carried it through the GE junction and 1.5 to 2 cm below the GE junction. I noted no obvious mucosal injury after performing this. I made sure that the myotomy and muscle fibers were pulled back 180 degrees.  Next, Dr. Ezzard Standing performed intraoperative upper endoscopy to be dictated in a detailed note by him. Initially he had trouble because of the dilated esophagus and the acute angle it took above the diaphragm. We then used fluoroscopy to show where the tip of the scope was and this was actually able to guide [name] through the GE junction using laparoscopy, upper endoscopy and fluoroscopy. He eventually entered the stomach. There was a lot of food particles and a lot of inflammation in the esophagus from food stasis.  They then pulled back above the GE junction. The myotomy was more  than adequate. It was very easy to go through the GE junction. I did notice some small bands of circular fibers being slightly constrictive. I bluntly took these down and released them. I placed the entire aspect of myotomy under water and Dr. Ezzard Standing insufflated air and we did not see any leaks.  Subsequently, Dr. Ezzard Standing evacuated all the air from the stomach by way of upper endoscopy and as much as he could from the esophagus. A large amount of the food content had been evacuated from the esophagus. I examined the myotomy once again. There were a few still constricting bands as I was able just to carefully remove bluntly.  The myotomy measured approximately 6-7 cm. I subsequently removed the Penrose drain. The liver retractor was removed and there was a small  underlying area  of capsular tear but this was not bleeding. We irrigated the area and evacuated the fluid and no bleeding was noted. I subsequently removed the liver retractor from the abdominal cavity. I then began removing trocars under direct vision and releasing pneumoperitoneum.  The epigastric fascial defect was closed by tightening up and tying down the pursestring suture. The skin incisions were closed with 4-0 monocryl subcuticular stitches. Steri-Strips and sterile dressings were applied.  He tolerated the procedure well without any obvious intraoperative complications. He will be taken to the recovery room where he will undergo a portable chest x-ray given the fact that there is a lot of manipulation in the esophagus in the mediastinum both endoscopically to remove food particles and laparoscopically to mobilize it. Subsequently, he will be kept n.p.o. overnight and a stat Gastrografin swallow study will be done first thing in the morning to rule out delayed entry. Dictated by:   Dominic Shaffer, M.D. Attending Physician:  Arlis Porta DD:  06/16/01 TD:  06/18/01 Job: [id number]

## 2010-06-17 ENCOUNTER — Encounter: Payer: Self-pay | Admitting: Internal Medicine

## 2010-06-17 NOTE — Assessment & Plan Note (Signed)
Guided aspiration is pending

## 2010-06-17 NOTE — Assessment & Plan Note (Signed)
Worse - situational. Stay off work

## 2010-06-17 NOTE — Assessment & Plan Note (Signed)
On Rx  BP Readings from Last 3 Encounters:  06/13/10 140/80  06/06/10 138/72  05/23/10 145/75

## 2010-06-17 NOTE — Assessment & Plan Note (Signed)
Discussed - remain off work

## 2010-06-23 ENCOUNTER — Ambulatory Visit (INDEPENDENT_AMBULATORY_CARE_PROVIDER_SITE_OTHER): Payer: BC Managed Care – PPO | Admitting: Cardiovascular Disease

## 2010-06-23 ENCOUNTER — Telehealth: Payer: Self-pay | Admitting: *Deleted

## 2010-06-23 ENCOUNTER — Encounter: Payer: Self-pay | Admitting: Cardiovascular Disease

## 2010-06-23 DIAGNOSIS — R0602 Shortness of breath: Secondary | ICD-10-CM

## 2010-06-23 DIAGNOSIS — I10 Essential (primary) hypertension: Secondary | ICD-10-CM

## 2010-06-23 DIAGNOSIS — I454 Nonspecific intraventricular block: Secondary | ICD-10-CM

## 2010-06-23 DIAGNOSIS — I447 Left bundle-branch block, unspecified: Secondary | ICD-10-CM

## 2010-06-23 NOTE — Assessment & Plan Note (Signed)
Well controlled.  Continue current medications and low sodium Dash type diet.    

## 2010-06-23 NOTE — Patient Instructions (Signed)
Your physician recommends that you schedule a follow-up appointment in: 1 year with Dr. Eden Emms if all of your test are good.  Your physician has requested that you have an echocardiogram. Echocardiography is a painless test that uses sound waves to create images of your heart. It provides your doctor with information about the size and shape of your heart and how well your heart's chambers and valves are working. This procedure takes approximately one hour. There are no restrictions for this procedure.  Your physician has requested that you have a lexiscan myoview. For further information please visit [email or web address] Please follow instruction sheet, as given.

## 2010-06-23 NOTE — Telephone Encounter (Signed)
Patient requesting to extend his leave from work. He is scheduled for some testing thru cardiologist. OK?

## 2010-06-23 NOTE — Telephone Encounter (Signed)
OK for both.  Thx

## 2010-06-23 NOTE — Assessment & Plan Note (Signed)
LBBB with dyspnea and mild LE edema.  Echo and Lexiscan myouve to R/O structural heart disease

## 2010-06-23 NOTE — Telephone Encounter (Signed)
Also, rec rf req for Cefuroxime 500mg  1 po bid # 20. Ok to Rf?

## 2010-06-23 NOTE — Progress Notes (Signed)
70 yo referred by Dr Paulette Blanch for LBBB.  No ECG for last 3 years.  Noted on preop ECG for percutaneous renal cyst drainage.  No previous cardiac problems.  Rx of HTN.  Gained 25 lbs last 4 years.  Sedentary with poor diet.  Mild exertional dyspnea.  NO SSCP, syncope.  NO history of DCM, CHF.  Has not had ETT before.  No previous history of advacned heart block.  Long discussion with patient and wife regarding Dx of LBBB.  Need to R/O structural heart disease also discussed. With dyspne and LBBB needs echo and Lexiscan myovue.   BP control seems adequate.  LBBB may be related to age and HTN  ROS: Denies fever, malais, weight loss, blurry vision, decreased visual acuity, cough, sputum, SOB, hemoptysis, pleuritic pain, palpitaitons, heartburn, abdominal pain, melena, lower extremity edema, claudication, or rash.   General: Affect appropriate Healthy:  appears stated age HEENT: normal Neck supple with no adenopathy JVP normal no bruits no thyromegaly Lungs clear with no wheezing and good diaphragmatic motion Heart:  S1/S2 no murmur,rub, gallop or click PMI normal Abdomen: benighn, BS positve, no tenderness, no AAA no bruit.  No HSM or HJR Distal pulses intact with no bruits No edema Neuro non-focal Skin warm and dry No muscular weakness  Medications Current Outpatient Prescriptions  Medication Sig Dispense Refill  . guaiFENesin-codeine (GUIATUSS AC) 100-10 MG/5ML syrup Take 5-10 mLs by mouth 4 (four) times daily as needed. For cough       . ibuprofen (ADVIL,MOTRIN) 600 MG tablet Take 600 mg by mouth 2 (two) times daily as needed.        . loratadine (CLARITIN) 10 MG tablet Take 10 mg by mouth as needed.       Marland Kitchen LORazepam (ATIVAN) 0.5 MG tablet Take 0.5 mg by mouth 2 (two) times daily as needed.        Marland Kitchen losartan (COZAAR) 100 MG tablet Take 100 mg by mouth daily. For blood pressure       . terazosin (HYTRIN) 5 MG capsule Take 5 mg by mouth at bedtime.        . traMADol (ULTRAM) 50 MG  tablet Take 50-100 mg by mouth 2 (two) times daily as needed.        Marland Kitchen DISCONTD: triamcinolone (KENALOG) 0.5 % cream Apply 1 application topically 2 (two) times daily.          Allergies Iodinated diagnostic agents  Family History: Family History  Problem Relation Age of Onset  . Hypertension Other   . Heart disease Mother 88    CAD  . Cancer Father 43    bladder ca    Social History: History   Social History  . Marital Status: Married    Spouse Name: N/A    Number of Children: N/A  . Years of Education: N/A   Occupational History  . Not on file.   Social History Main Topics  . Smoking status: Never Smoker   . Smokeless tobacco: Not on file  . Alcohol Use: No  . Drug Use: No  . Sexually Active: Not on file   Other Topics Concern  . Not on file   Social History Narrative   Regular exercise-no    Electrocardiogram:  NSR LBBB  Assessment and Plan

## 2010-06-24 MED ORDER — CEFUROXIME AXETIL 500 MG PO TABS: 500.0000 mg | ORAL_TABLET | Freq: Two times a day (BID) | ORAL | Status: DC

## 2010-06-27 NOTE — Telephone Encounter (Signed)
Pending signature

## 2010-06-27 NOTE — Telephone Encounter (Signed)
Left vm for pt that letter was ready for pick up

## 2010-07-03 ENCOUNTER — Encounter: Payer: Self-pay | Admitting: Internal Medicine

## 2010-07-04 ENCOUNTER — Ambulatory Visit
Admission: RE | Admit: 2010-07-04 | Discharge: 2010-07-04 | Disposition: A | Discharge status: Home / Self Care | Payer: BC Managed Care – PPO | Source: Ambulatory Visit | Attending: Interventional Radiology | Admitting: Interventional Radiology

## 2010-07-04 ENCOUNTER — Ambulatory Visit (INDEPENDENT_AMBULATORY_CARE_PROVIDER_SITE_OTHER): Payer: BC Managed Care – PPO | Admitting: Internal Medicine

## 2010-07-04 ENCOUNTER — Encounter: Payer: Self-pay | Admitting: Internal Medicine

## 2010-07-04 VITALS — BP 149/72 | HR 70 | Temp 97.8°F | Resp 16

## 2010-07-04 DIAGNOSIS — M25469 Effusion, unspecified knee: Secondary | ICD-10-CM

## 2010-07-04 DIAGNOSIS — M25569 Pain in unspecified knee: Secondary | ICD-10-CM

## 2010-07-04 DIAGNOSIS — N281 Cyst of kidney, acquired: Secondary | ICD-10-CM

## 2010-07-04 DIAGNOSIS — F411 Generalized anxiety disorder: Secondary | ICD-10-CM

## 2010-07-04 DIAGNOSIS — I454 Nonspecific intraventricular block: Secondary | ICD-10-CM

## 2010-07-04 MED ORDER — TERAZOSIN HCL 5 MG PO CAPS: 5.0000 mg | ORAL_CAPSULE | Freq: Every day | ORAL | Status: DC

## 2010-07-04 MED ORDER — LOSARTAN POTASSIUM 100 MG PO TABS: 100.0000 mg | ORAL_TABLET | Freq: Every day | ORAL | Status: DC

## 2010-07-04 MED ORDER — MELOXICAM 15 MG PO TABS: 15.0000 mg | ORAL_TABLET | Freq: Every day | ORAL | Status: DC | PRN

## 2010-07-04 NOTE — Progress Notes (Signed)
  Subjective:    Patient ID: Dominic Shaffer, male    DOB: Jun 03, 1940, 71 y.o.   MRN: [id number]  HPI F/u stress F/u cardiac issue C/o R knee and calf issue   Review of Systems  Constitutional: Negative for appetite change, fatigue and unexpected weight change.  HENT: Negative for nosebleeds, congestion, sore throat, sneezing, trouble swallowing and neck pain.   Eyes: Negative for itching and visual disturbance.  Respiratory: Negative for cough.   Cardiovascular: Negative for chest pain, palpitations and leg swelling.  Gastrointestinal: Negative for nausea, diarrhea, blood in stool and abdominal distention.  Genitourinary: Negative for frequency and hematuria.  Musculoskeletal: Positive for joint swelling (R knee) and gait problem. Negative for back pain.  Skin: Negative for rash.  Neurological: Negative for dizziness, tremors, speech difficulty and weakness.  Psychiatric/Behavioral: Negative for suicidal ideas, behavioral problems, sleep disturbance, dysphoric mood and agitation. The patient is nervous/anxious.        Objective:   Physical Exam  Constitutional: He is oriented to person, place, and time. He appears well-developed.       Obese  HENT:  Mouth/Throat: Oropharynx is clear and moist.  Eyes: Conjunctivae are normal. Pupils are equal, round, and reactive to light.  Neck: Normal range of motion. No JVD present. No thyromegaly present.  Cardiovascular: Normal rate, regular rhythm, normal heart sounds and intact distal pulses.  Exam reveals no gallop and no friction rub.   No murmur heard. Pulmonary/Chest: Effort normal and breath sounds normal. No respiratory distress. He has no wheezes. He has no rales. He exhibits no tenderness.  Abdominal: Soft. Bowel sounds are normal. He exhibits no distension and no mass. There is no tenderness. There is no rebound and no guarding.  Musculoskeletal: Normal range of motion. He exhibits tenderness (R knee w/ROM). He exhibits no  edema (no obvious R knee swelling).  Lymphadenopathy:    He has no cervical adenopathy.  Neurological: He is alert and oriented to person, place, and time. He has normal reflexes. No cranial nerve deficit. He exhibits normal muscle tone. Coordination normal.  Skin: Skin is warm and dry. No rash noted.  Psychiatric: His behavior is normal. Judgment and thought content normal.       subdued          Assessment & Plan:   OK to go back to work on Fri this week if cardiac w/up was ok

## 2010-07-05 ENCOUNTER — Ambulatory Visit (HOSPITAL_COMMUNITY): Payer: BC Managed Care – PPO | Attending: Cardiovascular Disease | Admitting: Radiology

## 2010-07-05 ENCOUNTER — Ambulatory Visit (HOSPITAL_BASED_OUTPATIENT_CLINIC_OR_DEPARTMENT_OTHER): Payer: BC Managed Care – PPO | Admitting: Radiology

## 2010-07-05 DIAGNOSIS — R0602 Shortness of breath: Secondary | ICD-10-CM

## 2010-07-05 DIAGNOSIS — E669 Obesity, unspecified: Secondary | ICD-10-CM | POA: Insufficient documentation

## 2010-07-05 DIAGNOSIS — I447 Left bundle-branch block, unspecified: Secondary | ICD-10-CM

## 2010-07-05 DIAGNOSIS — I10 Essential (primary) hypertension: Secondary | ICD-10-CM | POA: Insufficient documentation

## 2010-07-05 DIAGNOSIS — R9431 Abnormal electrocardiogram [ECG] [EKG]: Secondary | ICD-10-CM | POA: Insufficient documentation

## 2010-07-05 DIAGNOSIS — I079 Rheumatic tricuspid valve disease, unspecified: Secondary | ICD-10-CM | POA: Insufficient documentation

## 2010-07-05 DIAGNOSIS — R0609 Other forms of dyspnea: Secondary | ICD-10-CM

## 2010-07-05 DIAGNOSIS — R079 Chest pain, unspecified: Secondary | ICD-10-CM

## 2010-07-05 HISTORY — PX: TRANSTHORACIC ECHOCARDIOGRAM: SHX275

## 2010-07-05 MED ORDER — TECHNETIUM TC 99M TETROFOSMIN IV KIT
33.0000 | PACK | Freq: Once | INTRAVENOUS | Status: AC | PRN
  Administered 2010-07-05: 33 via INTRAVENOUS

## 2010-07-05 MED ORDER — ADENOSINE (DIAGNOSTIC) 3 MG/ML IV SOLN
0.8400 mg/kg | Freq: Once | INTRAVENOUS | Status: AC
  Administered 2010-07-05: 90 mg via INTRAVENOUS

## 2010-07-05 MED ORDER — TECHNETIUM TC 99M TETROFOSMIN IV KIT
11.0000 | PACK | Freq: Once | INTRAVENOUS | Status: AC | PRN
  Administered 2010-07-05: 11 via INTRAVENOUS

## 2010-07-05 NOTE — Progress Notes (Signed)
[facility] SITE 3 NUCLEAR MED [address] [place] [address] [phone]  Cardiology Nuclear Med Study  Dominic Shaffer is a 70 y.o. male [id number] 1940-06-21   Nuclear Med Background Indication for Stress Test:  Evaluation for Ischemia History:  No previous documented CAD Cardiac Risk Factors: Family History - CAD, Hypertension and LBBB  Symptoms:  Chest Pain, DOE and SOB   Nuclear Pre-Procedure Caffeine/Decaff Intake:  None NPO After: 5:30 pm   Lungs:  clear IV 0.9% NS with Angio Cath:  20g  IV Site: R Antecubital  IV Started by:  Bonnita Levan, RN  Chest Size (in):  46 Cup Size: n/a  Height: 5\' 10"  (1.778 m)  Weight:  236 lb (107.049 kg)  BMI:  Body mass index is 33.86 kg/(m^2). Tech Comments:  n/a    Nuclear Med Study 1 or 2 day study: 1 day  Stress Test Type:  Adenosine  Reading MD: Charlton Haws, MD  Order Authorizing Provider:  Dr. Burna Forts  Resting Radionuclide: Technetium 87m Tetrofosmin  Resting Radionuclide Dose: 11 mCi   Stress Radionuclide:  Technetium 76m Tetrofosmin  Stress Radionuclide Dose: 33 mCi           Stress Protocol Rest HR: 63 Stress HR:86  Rest BP: 144/86 Stress BP: 169/66  Exercise Time (min): n/a METS: n/a   Predicted Max HR: 152 bpm % Max HR: 56.58 bpm Rate Pressure Product: 01027   Dose of Adenosine (mg):  60.0 Dose of Lexiscan: n/a mg  Dose of Atropine (mg): n/a Dose of Dobutamine: n/a mcg/kg/min (at max HR)  Stress Test Technologist: Milana Na, EMT-P  Nuclear Technologist:  Domenic Polite, CNMT     Rest Procedure:  Myocardial perfusion imaging was performed at rest 45 minutes following the intravenous administration of Technetium 50m Tetrofosmin. Rest ECG: NSR-LBBB  Stress Procedure:  The patient received IV adenosine at 140 mcg/kg/min for 4 minutes.  There were non Dx 2nd to LBBB. Rare PVC seen not caught. (+) Chest tightness/pain and SOB with infusion.Technetium 22m Tetrofosmin was injected  at the 2 minute mark and quantitative spect images were obtained after a 45 minute delay. Stress ECG: No significant change from baseline ECG  QPS Raw Data Images:  Normal; no motion artifact; normal heart/lung ratio. Stress Images:  There is decreased uptake in the inferior wall. Rest Images:  There is decreased uptake in the inferior wall. Subtraction (SDS):  No evidence of ischemia. Transient Ischemic Dilatation (Normal <1.22):  1.0 Lung/Heart Ratio (Normal <0.45):  .36  Quantitative Gated Spect Images QGS EDV:  131 ml QGS ESV:  64 ml QGS cine images:  NL LV Function; NL Wall Motion QGS EF: 51%  Impression Exercise Capacity:  Adenosine study with no exercise. BP Response:  Normal blood pressure response. Clinical Symptoms:  There is dyspnea. ECG Impression:  No significant ST segment change suggestive of ischemia. Comparison with Prior Nuclear Study: No images to compare  Overall Impression:  Possible small inferior wall infarct at mid and basal level.  Low normal EF.  Baseline ECG LBBB       Charlton Haws

## 2010-07-06 NOTE — Progress Notes (Signed)
COPY ROUTED TO DR. Eden Emms

## 2010-07-07 ENCOUNTER — Telehealth: Payer: Self-pay | Admitting: Cardiovascular Disease

## 2010-07-07 NOTE — Telephone Encounter (Signed)
Pt calling for results stress test--results given--nt

## 2010-07-07 NOTE — Telephone Encounter (Signed)
Pt would like someone to call him asap to give him his results if he is not at home number pls call this number [phone]

## 2010-07-13 ENCOUNTER — Telehealth: Payer: Self-pay | Admitting: Cardiovascular Disease

## 2010-07-13 NOTE — Telephone Encounter (Signed)
Spoke with pt, aware of echo and myoview results. Follow up appt made Deliah Goody

## 2010-07-13 NOTE — Telephone Encounter (Signed)
Pt is returning nurse call re test results.

## 2010-07-14 ENCOUNTER — Ambulatory Visit (INDEPENDENT_AMBULATORY_CARE_PROVIDER_SITE_OTHER): Payer: BC Managed Care – PPO | Admitting: Cardiovascular Disease

## 2010-07-14 ENCOUNTER — Encounter: Payer: Self-pay | Admitting: Cardiovascular Disease

## 2010-07-14 DIAGNOSIS — I454 Nonspecific intraventricular block: Secondary | ICD-10-CM

## 2010-07-14 DIAGNOSIS — R609 Edema, unspecified: Secondary | ICD-10-CM

## 2010-07-14 DIAGNOSIS — I428 Other cardiomyopathies: Secondary | ICD-10-CM

## 2010-07-14 DIAGNOSIS — I420 Dilated cardiomyopathy: Secondary | ICD-10-CM

## 2010-07-14 DIAGNOSIS — R9430 Abnormal result of cardiovascular function study, unspecified: Secondary | ICD-10-CM

## 2010-07-14 DIAGNOSIS — I10 Essential (primary) hypertension: Secondary | ICD-10-CM

## 2010-07-14 LAB — BASIC METABOLIC PANEL
BUN: 13 mg/dL (ref 6–23)
CO2: 28 mEq/L (ref 19–32)
Chloride: 107 mEq/L (ref 96–112)
Potassium: 4 mEq/L (ref 3.5–5.1)

## 2010-07-14 MED ORDER — LOSARTAN POTASSIUM-HCTZ 100-12.5 MG PO TABS: 1.0000 | ORAL_TABLET | Freq: Every day | ORAL | Status: DC

## 2010-07-14 NOTE — Progress Notes (Signed)
Discussed at office visit Dominic Shaffer

## 2010-07-14 NOTE — Patient Instructions (Addendum)
Your physician recommends that you schedule a follow-up appointment in: 3 MONTHS  Your physician has requested that you have cardiac CT. Cardiac computed tomography (CT) is a painless test that uses an x-ray machine to take clear, detailed pictures of your heart. For further information please visit [email or web address] Please follow instruction sheet as given.   Your physician recommends that you return for lab work in: TODAY    STOP COZAAR  START LOSARTAN/HCTZ 100-12.5MG  ONCE DAILY

## 2010-07-14 NOTE — Assessment & Plan Note (Signed)
Etiology still not clear.  Apparent cardiomyopathy with EF 45-50% by echo and myovue with inferior infarct.  Cardiac CT to R/O CAD.  Continue ARB and add diuretic.  If cardiac CT not aproved will consider cardiac MRI to quantitate EF and assess for inferior hyperenhancement.

## 2010-07-14 NOTE — Assessment & Plan Note (Signed)
Well controlled.  Continue current medications and low sodium Dash type diet.    

## 2010-07-14 NOTE — Progress Notes (Signed)
70 yo referred by Dr Paulette Blanch for LBBB. No ECG for last 3 years. Noted on preop ECG for percutaneous renal cyst drainage. No previous cardiac problems. Rx of HTN. Gained 25 lbs last 4 years. Sedentary with poor diet. Mild exertional dyspnea. NO SSCP, syncope. NO history of DCM, CHF. Has not had ETT before. No previous history of advacned heart block. Long discussion with patient and wife regarding Dx of LBBB. Need to R/O structural heart disease also discussed.  Reviewed echo from 6/6 diffuse hypokinesis EF 45-50% Reviewed nuclear from 6/6  EF 51% inferior wall MI mid and basal level  Discussed options for proceeding.  Has cardiomyopathy with decreased EF and inferior wall defect.  Patient would like to avoid invasive evaluation.  It would be reasonable to do cardiac CT.  Class one indication for cardiac CT include new diagnosis of cardiomyopathy and to avoid invasive heart cath.  We would also be able to use less contrast with CT than cath and patient has a dye allergy so with premedication risk would be lower.    ROS: Denies fever, malais, weight loss, blurry vision, decreased visual acuity, cough, sputum, , hemoptysis, pleuritic pain, palpitaitons, heartburn, abdominal pain, melena,, claudication, or rash.  All other systems reviewed and negative  General: Affect appropriate Healthy:  appears stated age HEENT: normal Neck supple with no adenopathy JVP normal no bruits no thyromegaly Lungs clear with no wheezing and good diaphragmatic motion Heart:  S1/S2 no murmur,rub, gallop or click PMI normal Abdomen: benighn, BS positve, no tenderness, no AAA no bruit.  No HSM or HJR Distal pulses intact with no bruits Plus one bilateral  edema Neuro non-focal Skin warm and dry No muscular weakness   Current Outpatient Prescriptions  Medication Sig Dispense Refill  . loratadine (CLARITIN) 10 MG tablet Take 10 mg by mouth as needed.       Marland Kitchen LORazepam (ATIVAN) 0.5 MG tablet Take 0.5 mg by mouth 2  (two) times daily as needed.        Marland Kitchen losartan (COZAAR) 100 MG tablet Take 1 tablet (100 mg total) by mouth daily. For blood pressure  90 tablet  3  . meloxicam (MOBIC) 15 MG tablet Take 1 tablet (15 mg total) by mouth daily as needed for pain.  30 tablet  3  . terazosin (HYTRIN) 5 MG capsule Take 1 capsule (5 mg total) by mouth at bedtime.  90 capsule  3  . traMADol (ULTRAM) 50 MG tablet Take 50-100 mg by mouth 2 (two) times daily as needed.          Allergies  Iodinated diagnostic agents  Electrocardiogram:  Assessment and Plan

## 2010-07-14 NOTE — Assessment & Plan Note (Signed)
Add HCTZ by changing to Hyzaar from Cozaar BMET today

## 2010-07-17 ENCOUNTER — Telehealth: Payer: Self-pay | Admitting: *Deleted

## 2010-07-17 ENCOUNTER — Encounter: Payer: Self-pay | Admitting: Cardiovascular Disease

## 2010-07-17 ENCOUNTER — Encounter: Payer: Self-pay | Admitting: Internal Medicine

## 2010-07-17 MED ORDER — PREDNISONE 20 MG PO TABS: ORAL_TABLET | ORAL | Status: DC

## 2010-07-17 MED ORDER — DIPHENHYDRAMINE HCL 25 MG PO TABS: ORAL_TABLET | ORAL | Status: DC

## 2010-07-17 MED ORDER — FAMOTIDINE 40 MG PO TABS: ORAL_TABLET | ORAL | Status: DC

## 2010-07-17 NOTE — Telephone Encounter (Signed)
Spoke with pt wife, pt is scheduled for cardiac ct wed 07-19-10. Due to his contrast allergy, per dr Eden Emms, pt will take prednisone 60mg  daily x 3 days, benadryl 50mg  bid x 3 days and pepcid 40mg  daily x 3 days. Scripts called to [employer]

## 2010-07-19 ENCOUNTER — Ambulatory Visit (HOSPITAL_COMMUNITY)
Admission: RE | Admit: 2010-07-19 | Discharge: 2010-07-19 | Disposition: A | Discharge status: Home / Self Care | Payer: BC Managed Care – PPO | Source: Ambulatory Visit | Attending: Cardiovascular Disease | Admitting: Cardiovascular Disease

## 2010-07-19 DIAGNOSIS — I420 Dilated cardiomyopathy: Secondary | ICD-10-CM

## 2010-07-19 DIAGNOSIS — I447 Left bundle-branch block, unspecified: Secondary | ICD-10-CM

## 2010-07-19 DIAGNOSIS — I10 Essential (primary) hypertension: Secondary | ICD-10-CM

## 2010-07-19 DIAGNOSIS — R9430 Abnormal result of cardiovascular function study, unspecified: Secondary | ICD-10-CM

## 2010-07-19 MED ORDER — IOHEXOL 350 MG/ML SOLN
80.0000 mL | Freq: Once | INTRAVENOUS | Status: AC | PRN
  Administered 2010-07-19: 80 mL via INTRAVENOUS

## 2010-07-23 ENCOUNTER — Encounter: Payer: Self-pay | Admitting: Internal Medicine

## 2010-07-23 NOTE — Assessment & Plan Note (Signed)
Better. Cont Rx 

## 2010-07-23 NOTE — Assessment & Plan Note (Signed)
Cardiac w/up is in progress. See chart

## 2010-07-23 NOTE — Assessment & Plan Note (Addendum)
Procedure Note :    Procedure :   Sonography examination R knee   Indication: R knee pain; to see if needed to be tapped    Equipment used: Sonosite M-Turbo with an HFL38x/13-6 MHz transducer linear probe. The images were stored in the unit and later transferred in storage.  The patient was placed in a sitting position.   This study revealed a minimal fluid in R knee (slightely more than on the L) and very mild DJD changes   Impression: R knee minimal effusion   Will treat with meds/rest

## 2010-07-23 NOTE — Assessment & Plan Note (Signed)
No pain post-aspiration

## 2010-07-24 NOTE — Telephone Encounter (Signed)
C/o slight pain in right thigh . What vitamin pt can take .

## 2010-07-24 NOTE — Telephone Encounter (Signed)
Spoke with pt, he was wanting to know what vitamins he could take to increase his energy level. He also is c/o a dull, aching pain in his right thigh. If he applies heat it helps but will eventually come back. He denies swelling or bruising in the area. He recently had a cardiac ct but had an IV no groin stick. Pt referred to his primary care to follow up these issues. Pt voiced understanding Deliah Goody

## 2010-07-28 ENCOUNTER — Other Ambulatory Visit (INDEPENDENT_AMBULATORY_CARE_PROVIDER_SITE_OTHER): Payer: BC Managed Care – PPO

## 2010-07-28 ENCOUNTER — Ambulatory Visit (INDEPENDENT_AMBULATORY_CARE_PROVIDER_SITE_OTHER): Payer: BC Managed Care – PPO | Admitting: Internal Medicine

## 2010-07-28 ENCOUNTER — Ambulatory Visit (INDEPENDENT_AMBULATORY_CARE_PROVIDER_SITE_OTHER): Payer: BC Managed Care – PPO | Admitting: *Deleted

## 2010-07-28 ENCOUNTER — Ambulatory Visit: Payer: BC Managed Care – PPO | Admitting: Internal Medicine

## 2010-07-28 ENCOUNTER — Telehealth: Payer: Self-pay | Admitting: Cardiovascular Disease

## 2010-07-28 ENCOUNTER — Other Ambulatory Visit: Payer: Self-pay | Admitting: Internal Medicine

## 2010-07-28 ENCOUNTER — Other Ambulatory Visit: Payer: Self-pay | Admitting: *Deleted

## 2010-07-28 ENCOUNTER — Encounter: Payer: Self-pay | Admitting: Internal Medicine

## 2010-07-28 VITALS — BP 130/62 | HR 74 | Temp 98.6°F | Ht 70.0 in | Wt 228.5 lb

## 2010-07-28 DIAGNOSIS — M79609 Pain in unspecified limb: Secondary | ICD-10-CM

## 2010-07-28 DIAGNOSIS — M79604 Pain in right leg: Secondary | ICD-10-CM

## 2010-07-28 DIAGNOSIS — I82419 Acute embolism and thrombosis of unspecified femoral vein: Secondary | ICD-10-CM | POA: Insufficient documentation

## 2010-07-28 DIAGNOSIS — M7989 Other specified soft tissue disorders: Secondary | ICD-10-CM

## 2010-07-28 DIAGNOSIS — I82409 Acute embolism and thrombosis of unspecified deep veins of unspecified lower extremity: Secondary | ICD-10-CM

## 2010-07-28 DIAGNOSIS — I824Y9 Acute embolism and thrombosis of unspecified deep veins of unspecified proximal lower extremity: Secondary | ICD-10-CM

## 2010-07-28 DIAGNOSIS — Z0000 Encounter for general adult medical examination without abnormal findings: Secondary | ICD-10-CM | POA: Insufficient documentation

## 2010-07-28 DIAGNOSIS — M79605 Pain in left leg: Secondary | ICD-10-CM | POA: Insufficient documentation

## 2010-07-28 LAB — CBC WITH DIFFERENTIAL/PLATELET
Basophils Relative: 0.4 % (ref 0.0–3.0)
Eosinophils Absolute: 0.1 10*3/uL (ref 0.0–0.7)
Eosinophils Relative: 0.8 % (ref 0.0–5.0)
HCT: 40.9 % (ref 39.0–52.0)
Lymphs Abs: 1.6 10*3/uL (ref 0.7–4.0)
MCHC: 33.3 g/dL (ref 30.0–36.0)
MCV: 82.1 fl (ref 78.0–100.0)
Monocytes Absolute: 1.1 10*3/uL — ABNORMAL HIGH (ref 0.1–1.0)
Neutrophils Relative %: 77.5 % — ABNORMAL HIGH (ref 43.0–77.0)
RBC: 4.98 Mil/uL (ref 4.22–5.81)
WBC: 12.7 10*3/uL — ABNORMAL HIGH (ref 4.5–10.5)

## 2010-07-28 LAB — PROTIME-INR: INR: 1.2 ratio — ABNORMAL HIGH (ref 0.8–1.0)

## 2010-07-28 MED ORDER — TRAMADOL HCL 50 MG PO TABS: 50.0000 mg | ORAL_TABLET | Freq: Four times a day (QID) | ORAL | Status: DC | PRN

## 2010-07-28 MED ORDER — ENOXAPARIN SODIUM 150 MG/ML ~~LOC~~ SOLN: SUBCUTANEOUS | Status: DC

## 2010-07-28 MED ORDER — WARFARIN SODIUM 5 MG PO TABS: 5.0000 mg | ORAL_TABLET | Freq: Every day | ORAL | Status: DC

## 2010-07-28 NOTE — Assessment & Plan Note (Addendum)
I think possible superficial phlebitis rather likely at minimum, but cant r/o DVT as well, will tx with ultram for pain, to take the ASA daily, and check RLE venous doppler;  Fortunately o/w asympt;  Will check PT/INR, and cbc baseline and will need outpt lovenox and coumadin;  Pt will need lovenox education most likley if needs med

## 2010-07-28 NOTE — Progress Notes (Signed)
Subjective:    Patient ID: Dominic Shaffer, male    DOB: [birth year], 70 y.o.   MRN: [id number]  HPI here today with acute onset RLE pain, swelling, and warmth for 2-3 days;  Pain fairly steady, constant, mild to mod, worse behind the right knee but also to the medial right thigh and groin area as well; no hx of DVT in the past;  Admits to not taking ASA on regular basis.  No recent trauma, and  Pt denies fever, wt loss, night sweats, loss of appetite, or other constitutional symptoms  Pt denies chest pain, increased sob or doe, wheezing, orthopnea, PND, increased LE swelling, palpitations, dizziness or syncope.  Pt denies new neurological symptoms such as new headache, or facial or extremity weakness or numbness   Pt denies polydipsia, polyuria. Past Medical History  Diagnosis Date  . Achalasia   . Hypertension   . LBP (low back pain)   . BPH (benign prostatic hypertrophy)   . Umbilical hernia   . Internal hemorrhoid 2003    Dr. Marina Goodell   Past Surgical History  Procedure Date  . Hernia repair     reports that he quit smoking about 45 years ago. His smoking use included Cigarettes. He does not have any smokeless tobacco history on file. He reports that he does not drink alcohol or use illicit drugs. family history includes Cancer (age of onset:97) in his father; Heart disease (age of onset:70) in his mother; and Hypertension in his other. Allergies  Allergen Reactions  . Iodinated Diagnostic Agents Hives   Current Outpatient Prescriptions on File Prior to Visit  Medication Sig Dispense Refill  . losartan-hydrochlorothiazide (HYZAAR) 100-12.5 MG per tablet Take 1 tablet by mouth daily.  30 tablet  12  . meloxicam (MOBIC) 15 MG tablet Take 1 tablet (15 mg total) by mouth daily as needed for pain.  30 tablet  3  . terazosin (HYTRIN) 5 MG capsule Take 1 capsule (5 mg total) by mouth at bedtime.  90 capsule  3  . DISCONTD: cefUROXime (CEFTIN) 500 MG tablet TAKE 1 TABLET TWICE DAILY  20  tablet  1  . DISCONTD: diphenhydrAMINE (BENADRYL) 25 MG tablet Two tablets twice daily x 3 days  12 tablet  0  . DISCONTD: famotidine (PEPCID) 40 MG tablet One tablet once daily x 3 days  3 tablet  0  . DISCONTD: loratadine (CLARITIN) 10 MG tablet Take 10 mg by mouth as needed.       Marland Kitchen DISCONTD: LORazepam (ATIVAN) 0.5 MG tablet Take 0.5 mg by mouth 2 (two) times daily as needed.        Marland Kitchen DISCONTD: predniSONE (DELTASONE) 20 MG tablet Three tablets once daily for 3 days  9 tablet  0  . DISCONTD: traMADol (ULTRAM) 50 MG tablet Take 50-100 mg by mouth 2 (two) times daily as needed.         Review of Systems Review of Systems  Constitutional: Negative for diaphoresis and unexpected weight change.  HENT: Negative for drooling and tinnitus.   Eyes: Negative for photophobia and visual disturbance.  Respiratory: Negative for choking and stridor.   Gastrointestinal: Negative for vomiting and blood in stool.  Genitourinary: Negative for hematuria and decreased urine volume.    Objective:   Physical Exam BP 130/62  Pulse 74  Temp(Src) 98.6 F (37 C) (Oral)  Ht 5\' 10"  (1.778 m)  Wt 228 lb 8 oz (103.647 kg)  BMI 32.79 kg/m2  SpO2 94% Physical Exam  VS noted, not ill appearing Constitutional: Pt appears well-developed and well-nourished.  HENT: Head: Normocephalic.  Right Ear: External ear normal.  Left Ear: External ear normal.  Eyes: Conjunctivae and EOM are normal. Pupils are equal, round, and reactive to light.  Neck: Normal range of motion. Neck supple.  Cardiovascular: Normal rate and regular rhythm.   Pulmonary/Chest: Effort normal and breath sounds normal.  Abd:  Soft, NT, non-distended, + BS Neurological: Pt is alert. No cranial nerve deficit.  Skin: Skin is warm. No erythema. except for medial right thigh and post right knee warmth, tender, ? Superficial cords noted; and diffuse swelling noted to RLE except for the lateral thigh area (which also is nontender/nonerythem) Psychiatric:  Pt behavior is normal. Thought content normal.         Assessment & Plan:

## 2010-07-28 NOTE — Progress Notes (Signed)
Addended by: Corwin Levins on: 07/28/2010 03:51 PM   Modules accepted: Orders

## 2010-07-28 NOTE — Telephone Encounter (Signed)
Spoke with pt, he had arterial dopplers and he has quit an extensive clot in his leg. He is being placed on coumadin and being managed by dr plotnikov. He wanted to make dr Eden Emms aware Deliah Goody

## 2010-07-28 NOTE — Telephone Encounter (Signed)
Pt calls with increased swelling in leg with heat and increased pain.  He has appt with his pcp at 11:00am today.  He would liked Dr. Eden Emms nurse, Stanton Kidney, to call him back today if possible.  I will forward to [facility]. Mylo Red RN

## 2010-07-28 NOTE — Progress Notes (Signed)
Addended by: Corwin Levins on: 07/28/2010 03:11 PM   Modules accepted: Orders

## 2010-07-28 NOTE — Patient Instructions (Addendum)
You will be contacted regarding the referral for: RLE vein ultrasound (see PCC's now) Continue all other medications as before, including the ultram, and taking the Aspirin every day  - 81 mg  - 1 per day  - COATED only If there is blood clot that seems close or is in the deep system, you will need to start Lovenox and Coumadin (this can be arranged by phone)    Please go to LAB in the Basement for the blood and/or urine tests to be done today  Please call the phone number [phone] (the [employer] System) for results of testing in 2-3 days;  When calling, simply dial the number, and when prompted enter the MRN number above (the Medical Record Number) and the # key, then the message should start.  Take all new medications as prescribed - to start the lovenox today, and then start the coumadin tomorrow  You will be contacted regarding the referral for: coumadin clinic for Monday July 2 (see the [facility] before leaving today)  Please return in 1 wk to see Dr Posey Rea

## 2010-07-28 NOTE — Telephone Encounter (Signed)
Or cell [phone], swelling in right leg with heat to touch for 2 days, having pain now

## 2010-07-31 ENCOUNTER — Ambulatory Visit (INDEPENDENT_AMBULATORY_CARE_PROVIDER_SITE_OTHER): Payer: BC Managed Care – PPO | Admitting: *Deleted

## 2010-07-31 ENCOUNTER — Encounter: Payer: Self-pay | Admitting: Internal Medicine

## 2010-07-31 DIAGNOSIS — Z7901 Long term (current) use of anticoagulants: Secondary | ICD-10-CM

## 2010-07-31 DIAGNOSIS — I82409 Acute embolism and thrombosis of unspecified deep veins of unspecified lower extremity: Secondary | ICD-10-CM

## 2010-08-03 ENCOUNTER — Telehealth: Payer: Self-pay | Admitting: Cardiovascular Disease

## 2010-08-03 NOTE — Telephone Encounter (Signed)
Spoke with pt, he requested an appt with dr Eden Emms to follow up about the clot he has in his right leg. appt made Deliah Goody

## 2010-08-03 NOTE — Telephone Encounter (Signed)
Pt has a blood clot in his right leg and wants to be seen sooner

## 2010-08-04 ENCOUNTER — Encounter: Payer: Self-pay | Admitting: Internal Medicine

## 2010-08-04 ENCOUNTER — Ambulatory Visit (INDEPENDENT_AMBULATORY_CARE_PROVIDER_SITE_OTHER): Payer: BC Managed Care – PPO | Admitting: Cardiovascular Disease

## 2010-08-04 ENCOUNTER — Encounter: Payer: Self-pay | Admitting: Cardiovascular Disease

## 2010-08-04 ENCOUNTER — Ambulatory Visit (INDEPENDENT_AMBULATORY_CARE_PROVIDER_SITE_OTHER): Payer: BC Managed Care – PPO | Admitting: *Deleted

## 2010-08-04 ENCOUNTER — Encounter: Payer: Self-pay | Admitting: *Deleted

## 2010-08-04 ENCOUNTER — Ambulatory Visit (INDEPENDENT_AMBULATORY_CARE_PROVIDER_SITE_OTHER): Payer: BC Managed Care – PPO | Admitting: Internal Medicine

## 2010-08-04 DIAGNOSIS — I454 Nonspecific intraventricular block: Secondary | ICD-10-CM

## 2010-08-04 DIAGNOSIS — I82409 Acute embolism and thrombosis of unspecified deep veins of unspecified lower extremity: Secondary | ICD-10-CM

## 2010-08-04 DIAGNOSIS — N281 Cyst of kidney, acquired: Secondary | ICD-10-CM

## 2010-08-04 DIAGNOSIS — I10 Essential (primary) hypertension: Secondary | ICD-10-CM

## 2010-08-04 DIAGNOSIS — M79609 Pain in unspecified limb: Secondary | ICD-10-CM

## 2010-08-04 DIAGNOSIS — Z7901 Long term (current) use of anticoagulants: Secondary | ICD-10-CM

## 2010-08-04 DIAGNOSIS — R609 Edema, unspecified: Secondary | ICD-10-CM

## 2010-08-04 LAB — POCT INR: INR: 2.8

## 2010-08-04 MED ORDER — ALPRAZOLAM 0.25 MG PO TABS: 0.2500 mg | ORAL_TABLET | Freq: Two times a day (BID) | ORAL | Status: DC

## 2010-08-04 NOTE — Patient Instructions (Signed)
Follow up with Dr Eden Emms as needed Please continue your medications as listed

## 2010-08-04 NOTE — Patient Instructions (Signed)
Elevate right leg Use compression socks in daytime [email or web address]

## 2010-08-04 NOTE — Assessment & Plan Note (Signed)
Continue coumadin and F/U duplex in 3 months.  To be followed by primary.  INR today

## 2010-08-04 NOTE — Assessment & Plan Note (Signed)
R knee is better

## 2010-08-04 NOTE — Assessment & Plan Note (Signed)
On Rx - better 

## 2010-08-04 NOTE — Assessment & Plan Note (Signed)
Well controlled.  Continue current medications and low sodium Dash type diet.    

## 2010-08-04 NOTE — Progress Notes (Signed)
  Subjective:    Patient ID: [name], male    DOB: [birth year], 70 y.o.   MRN: [id number]  HPI  F/u DVT RLE and anticoagulation F/u swelling C/o anxiety  Subjective:    Patient ID: [name], male    DOB: 1940/05/22, 70 y.o.   MRN: [id number]  HPI F/u stress F/u cardiac issue C/o R knee and calf issue   Review of Systems  Constitutional: Negative for appetite change, fatigue and unexpected weight change.  HENT: Negative for nosebleeds, congestion, sore throat, sneezing, trouble swallowing and neck pain.   Eyes: Negative for itching and visual disturbance.  Respiratory: Negative for cough.   Cardiovascular: Negative for chest pain, palpitations and leg swelling.  Gastrointestinal: Negative for nausea, diarrhea, blood in stool and abdominal distention.  Genitourinary: Negative for frequency and hematuria.  Musculoskeletal: Positive for joint swelling (R knee) and gait problem. Negative for back pain.  Skin: Negative for rash.  Neurological: Negative for dizziness, tremors, speech difficulty and weakness.  Psychiatric/Behavioral: Negative for suicidal ideas, behavioral problems, sleep disturbance, dysphoric mood and agitation. The patient is nervous/anxious.        Objective:   Physical Exam  Constitutional: He is oriented to person, place, and time. He appears well-developed.       Obese  HENT:  Mouth/Throat: Oropharynx is clear and moist.  Eyes: Conjunctivae are normal. Pupils are equal, round, and reactive to light.  Neck: Normal range of motion. No JVD present. No thyromegaly present.  Cardiovascular: Normal rate, regular rhythm, normal heart sounds and intact distal pulses.  Exam reveals no gallop and no friction rub.   No murmur heard. Pulmonary/Chest: Effort normal and breath sounds normal. No respiratory distress. He has no wheezes. He has no rales. He exhibits no tenderness.  Abdominal: Soft. Bowel sounds are normal. He exhibits no distension and  no mass. There is no tenderness. There is no rebound and no guarding.  Musculoskeletal: Normal range of motion. He exhibits tenderness (R knee w/ROM). He exhibits no edema (no obvious R knee swelling). B LE w/trace edema Lymphadenopathy:    He has no cervical adenopathy.  Neurological: He is alert and oriented to person, place, and time. He has normal reflexes. No cranial nerve deficit. He exhibits normal muscle tone. Coordination normal.  Skin: Skin is warm and dry. No rash noted.  Psychiatric: His behavior is normal. Judgment and thought content normal.      Sad, subdued          Assessment & Plan:    Review of Systems     Objective:   Physical Exam        Assessment & Plan:

## 2010-08-04 NOTE — Assessment & Plan Note (Signed)
Cont Rx 

## 2010-08-04 NOTE — Assessment & Plan Note (Signed)
Low normal EF no critical CAD by CT only moderate LAD.  Consider F/U myovue in 2 years

## 2010-08-04 NOTE — Assessment & Plan Note (Signed)
Doing well 

## 2010-08-04 NOTE — Progress Notes (Signed)
70 yo recent cardiac CT with calcium score 28 and <50% LAD/D1 disease.  Subsequently developed RLE DVT.  Reviewed [name] ordered by Dr Posey Rea.  Thrombus involved CFV,SFV, and popliteal.  Started on coumadin.  Still with some pain and swelling.  Discussed pathophysiology and prognosis with him.  Will need F/U duplex in 3 months and 6 months.  Would keep on coumadin at least 6 months and longer if clot slow to resolve by duplex.  Moist heat for pain.  Will expedite INR check this morining.    ROS: Denies fever, malais, weight loss, blurry vision, decreased visual acuity, cough, sputum, SOB, hemoptysis, pleuritic pain, palpitaitons, heartburn, abdominal pain, melena, lower extremity edema, claudication, or rash.  All other systems reviewed and negative  General: Affect appropriate Healthy:  appears stated age HEENT: normal Neck supple with no adenopathy JVP normal no bruits no thyromegaly Lungs clear with no wheezing and good diaphragmatic motion Heart:  S1/S2 no murmur,rub, gallop or click PMI normal Abdomen: benighn, BS positve, no tenderness, no AAA no bruit.  No HSM or HJR Distal pulses intact with no bruits No edema Neuro non-focal Skin warm and dry No muscular weakness   Current Outpatient Prescriptions  Medication Sig Dispense Refill  . enoxaparin (LOVENOX) 150 MG/ML injection 150 mg SQ per day for 10 days  10 mL  0  . losartan-hydrochlorothiazide (HYZAAR) 100-12.5 MG per tablet Take 1 tablet by mouth daily.  30 tablet  12  . meloxicam (MOBIC) 15 MG tablet Take 1 tablet (15 mg total) by mouth daily as needed for pain.  30 tablet  3  . terazosin (HYTRIN) 5 MG capsule Take 1 capsule (5 mg total) by mouth at bedtime.  90 capsule  3  . warfarin (COUMADIN) 5 MG tablet Take 1 tablet (5 mg total) by mouth daily.  100 tablet  3  . DISCONTD: traMADol (ULTRAM) 50 MG tablet Take 1-2 tablets (50-100 mg total) by mouth every 6 (six) hours as needed.  120 tablet  1    Allergies  Iodinated  diagnostic agents  Electrocardiogram:  Assessment and Plan

## 2010-08-10 ENCOUNTER — Ambulatory Visit (INDEPENDENT_AMBULATORY_CARE_PROVIDER_SITE_OTHER): Payer: BC Managed Care – PPO | Admitting: *Deleted

## 2010-08-10 DIAGNOSIS — Z7901 Long term (current) use of anticoagulants: Secondary | ICD-10-CM

## 2010-08-10 DIAGNOSIS — I82409 Acute embolism and thrombosis of unspecified deep veins of unspecified lower extremity: Secondary | ICD-10-CM

## 2010-08-10 LAB — POCT INR: INR: 5.6

## 2010-08-11 ENCOUNTER — Encounter: Payer: BC Managed Care – PPO | Admitting: *Deleted

## 2010-08-14 ENCOUNTER — Ambulatory Visit (INDEPENDENT_AMBULATORY_CARE_PROVIDER_SITE_OTHER): Payer: BC Managed Care – PPO | Admitting: *Deleted

## 2010-08-14 DIAGNOSIS — Z7901 Long term (current) use of anticoagulants: Secondary | ICD-10-CM

## 2010-08-14 DIAGNOSIS — I82409 Acute embolism and thrombosis of unspecified deep veins of unspecified lower extremity: Secondary | ICD-10-CM

## 2010-08-18 ENCOUNTER — Encounter: Payer: Self-pay | Admitting: Internal Medicine

## 2010-08-18 ENCOUNTER — Telehealth: Payer: Self-pay | Admitting: *Deleted

## 2010-08-18 ENCOUNTER — Ambulatory Visit (INDEPENDENT_AMBULATORY_CARE_PROVIDER_SITE_OTHER): Payer: BC Managed Care – PPO | Admitting: Internal Medicine

## 2010-08-18 DIAGNOSIS — Z733 Stress, not elsewhere classified: Secondary | ICD-10-CM

## 2010-08-18 DIAGNOSIS — M25569 Pain in unspecified knee: Secondary | ICD-10-CM

## 2010-08-18 DIAGNOSIS — M79604 Pain in right leg: Secondary | ICD-10-CM

## 2010-08-18 DIAGNOSIS — F439 Reaction to severe stress, unspecified: Secondary | ICD-10-CM

## 2010-08-18 DIAGNOSIS — I82409 Acute embolism and thrombosis of unspecified deep veins of unspecified lower extremity: Secondary | ICD-10-CM

## 2010-08-18 DIAGNOSIS — J069 Acute upper respiratory infection, unspecified: Secondary | ICD-10-CM

## 2010-08-18 DIAGNOSIS — I10 Essential (primary) hypertension: Secondary | ICD-10-CM

## 2010-08-18 DIAGNOSIS — M79609 Pain in unspecified limb: Secondary | ICD-10-CM

## 2010-08-18 MED ORDER — AMOXICILLIN 500 MG PO CAPS: 1000.0000 mg | ORAL_CAPSULE | Freq: Two times a day (BID) | ORAL | Status: AC

## 2010-08-18 MED ORDER — OLMESARTAN-AMLODIPINE-HCTZ 40-5-25 MG PO TABS: 1.0000 | ORAL_TABLET | ORAL | Status: DC

## 2010-08-18 NOTE — Telephone Encounter (Signed)
P s/o lightheadedness & dizziness for past few days mainly in the morning. Scheduled today at 4:00pm/Stacey aware.

## 2010-08-18 NOTE — Patient Instructions (Signed)
Take Amoxicillin if fever and congestion in ears is worse

## 2010-08-19 DIAGNOSIS — J069 Acute upper respiratory infection, unspecified: Secondary | ICD-10-CM | POA: Insufficient documentation

## 2010-08-19 NOTE — Assessment & Plan Note (Signed)
Better  

## 2010-08-19 NOTE — Assessment & Plan Note (Signed)
On Coumadin 

## 2010-08-19 NOTE — Assessment & Plan Note (Signed)
Abx if worse

## 2010-08-19 NOTE — Progress Notes (Signed)
  Subjective:    Patient ID: [name], male    DOB: Sep 13, 1940, 70 y.o.   MRN: [id number]  HPI    Review of Systems     Objective:   Physical Exam        Assessment & Plan:   Subjective:    Patient ID: [name], male    DOB: 26-Aug-1940, 70 y.o.   MRN: [id number]  HPI C/o dizziness, ear popping x 2-3 d F/u DVT RLE and anticoagulation F/u LE swelling, anxiety  Subjective:    Patient ID: [name], male    DOB: 09/05/1940, 70 y.o.   MRN: [id number]  HPI F/u stress F/u cardiac issue C/o R knee and calf issue   Review of Systems  Constitutional: Negative for appetite change, fatigue and unexpected weight change.  HENT: Negative for nosebleeds, congestion, sore throat, sneezing, trouble swallowing and neck pain.   Eyes: Negative for itching and visual disturbance.  Respiratory: Negative for cough.   Cardiovascular: Negative for chest pain, palpitations and leg swelling.  Gastrointestinal: Negative for nausea, diarrhea, blood in stool and abdominal distention.  Genitourinary: Negative for frequency and hematuria.  Musculoskeletal: Positive for less  joint swelling (R knee) and less gait problem. Negative for back pain.  Skin: Negative for rash.  Neurological: Negative for dizziness, tremors, speech difficulty and weakness.  Psychiatric/Behavioral: Negative for suicidal ideas, behavioral problems, sleep disturbance, dysphoric mood and agitation. The patient is less nervous/anxious.        Objective:   Physical Exam  Constitutional: He is oriented to person, place, and time. He appears well-developed.       Obese  HENT:  Mouth/Throat: Oropharynx is clear and moist.  Eyes: Conjunctivae are normal. Pupils are equal, round, and reactive to light.  Neck: Normal range of motion. No JVD present. No thyromegaly present.  Cardiovascular: Normal rate, regular rhythm, normal heart sounds and intact distal pulses.  Exam reveals no gallop and no  friction rub.   No murmur heard. Pulmonary/Chest: Effort normal and breath sounds normal. No respiratory distress. He has no wheezes. He has no rales. He exhibits no tenderness.  Abdominal: Soft. Bowel sounds are normal. He exhibits no distension and no mass. There is no tenderness. There is no rebound and no guarding.  Musculoskeletal: Normal range of motion. He exhibits less tenderness (R knee w/ROM). He exhibits no edema (no obvious R knee swelling). B LE w/trace edema Lymphadenopathy:    He has no cervical adenopathy.  Neurological: He is alert and oriented to person, place, and time. He has normal reflexes. No cranial nerve deficit. He exhibits normal muscle tone. Coordination normal.  Skin: Skin is warm and dry. No rash noted.  Psychiatric: His behavior is normal. Judgment and thought content normal.      Less subdued          Assessment & Plan:    Review of Systems     Objective:   Physical Exam        Assessment & Plan:

## 2010-08-19 NOTE — Assessment & Plan Note (Signed)
BP Readings from Last 3 Encounters:  08/18/10 170/92  08/04/10 108/60  08/04/10 160/80   Worse See Meds

## 2010-08-19 NOTE — Assessment & Plan Note (Signed)
Better Compr sock On Coumadin

## 2010-08-21 ENCOUNTER — Ambulatory Visit (INDEPENDENT_AMBULATORY_CARE_PROVIDER_SITE_OTHER): Payer: BC Managed Care – PPO | Admitting: *Deleted

## 2010-08-21 ENCOUNTER — Encounter: Payer: BC Managed Care – PPO | Admitting: *Deleted

## 2010-08-21 DIAGNOSIS — Z7901 Long term (current) use of anticoagulants: Secondary | ICD-10-CM

## 2010-08-21 DIAGNOSIS — I82409 Acute embolism and thrombosis of unspecified deep veins of unspecified lower extremity: Secondary | ICD-10-CM

## 2010-08-25 ENCOUNTER — Encounter: Payer: Self-pay | Admitting: Internal Medicine

## 2010-08-28 ENCOUNTER — Ambulatory Visit (INDEPENDENT_AMBULATORY_CARE_PROVIDER_SITE_OTHER): Payer: BC Managed Care – PPO | Admitting: *Deleted

## 2010-08-28 DIAGNOSIS — I82409 Acute embolism and thrombosis of unspecified deep veins of unspecified lower extremity: Secondary | ICD-10-CM

## 2010-08-28 DIAGNOSIS — Z7901 Long term (current) use of anticoagulants: Secondary | ICD-10-CM

## 2010-09-11 ENCOUNTER — Ambulatory Visit (INDEPENDENT_AMBULATORY_CARE_PROVIDER_SITE_OTHER): Payer: Medicare Other | Admitting: *Deleted

## 2010-09-11 DIAGNOSIS — Z7901 Long term (current) use of anticoagulants: Secondary | ICD-10-CM

## 2010-09-11 DIAGNOSIS — I82409 Acute embolism and thrombosis of unspecified deep veins of unspecified lower extremity: Secondary | ICD-10-CM

## 2010-09-12 ENCOUNTER — Other Ambulatory Visit: Payer: Self-pay | Admitting: *Deleted

## 2010-09-12 MED ORDER — TERAZOSIN HCL 5 MG PO CAPS: 5.0000 mg | ORAL_CAPSULE | Freq: Every day | ORAL | Status: DC

## 2010-09-14 ENCOUNTER — Other Ambulatory Visit: Payer: Self-pay | Admitting: *Deleted

## 2010-09-14 MED ORDER — WARFARIN SODIUM 5 MG PO TABS: 5.0000 mg | ORAL_TABLET | Freq: Every day | ORAL | Status: DC

## 2010-09-25 ENCOUNTER — Ambulatory Visit (INDEPENDENT_AMBULATORY_CARE_PROVIDER_SITE_OTHER): Payer: Medicare Other | Admitting: *Deleted

## 2010-09-25 DIAGNOSIS — Z7901 Long term (current) use of anticoagulants: Secondary | ICD-10-CM

## 2010-09-25 DIAGNOSIS — I82409 Acute embolism and thrombosis of unspecified deep veins of unspecified lower extremity: Secondary | ICD-10-CM

## 2010-09-25 LAB — POCT INR: INR: 3.5

## 2010-10-09 ENCOUNTER — Encounter: Payer: Self-pay | Admitting: Internal Medicine

## 2010-10-09 ENCOUNTER — Ambulatory Visit (INDEPENDENT_AMBULATORY_CARE_PROVIDER_SITE_OTHER): Payer: Medicare Other | Admitting: Internal Medicine

## 2010-10-09 ENCOUNTER — Ambulatory Visit (INDEPENDENT_AMBULATORY_CARE_PROVIDER_SITE_OTHER): Payer: Medicare Other | Admitting: *Deleted

## 2010-10-09 DIAGNOSIS — M545 Low back pain, unspecified: Secondary | ICD-10-CM

## 2010-10-09 DIAGNOSIS — Z7901 Long term (current) use of anticoagulants: Secondary | ICD-10-CM

## 2010-10-09 DIAGNOSIS — R609 Edema, unspecified: Secondary | ICD-10-CM

## 2010-10-09 DIAGNOSIS — F411 Generalized anxiety disorder: Secondary | ICD-10-CM

## 2010-10-09 DIAGNOSIS — I82409 Acute embolism and thrombosis of unspecified deep veins of unspecified lower extremity: Secondary | ICD-10-CM

## 2010-10-09 DIAGNOSIS — I10 Essential (primary) hypertension: Secondary | ICD-10-CM

## 2010-10-09 DIAGNOSIS — Z733 Stress, not elsewhere classified: Secondary | ICD-10-CM

## 2010-10-09 DIAGNOSIS — F439 Reaction to severe stress, unspecified: Secondary | ICD-10-CM

## 2010-10-09 MED ORDER — OMEPRAZOLE 40 MG PO CPDR: 40.0000 mg | DELAYED_RELEASE_CAPSULE | Freq: Every day | ORAL | Status: DC

## 2010-10-09 MED ORDER — AMLODIPINE BESYLATE 5 MG PO TABS: 5.0000 mg | ORAL_TABLET | Freq: Every day | ORAL | Status: DC

## 2010-10-09 MED ORDER — MELOXICAM 15 MG PO TABS: 15.0000 mg | ORAL_TABLET | Freq: Every day | ORAL | Status: AC | PRN

## 2010-10-09 MED ORDER — LOSARTAN POTASSIUM-HCTZ 100-25 MG PO TABS: 1.0000 | ORAL_TABLET | Freq: Every day | ORAL | Status: DC

## 2010-10-09 NOTE — Patient Instructions (Signed)
Use compression sock lately

## 2010-10-09 NOTE — Progress Notes (Signed)
  Subjective:    Patient ID: Dominic Shaffer, male    DOB: 1940/08/10, 70 y.o.   MRN: [id number]  HPI The patient presents for a follow-up of  chronic hypertension, DVT, OA controlled with medicines C/o R knee pain and swelling on R leg - worse     Review of Systems  Musculoskeletal: Positive for joint swelling (R knee is swollen).       Objective:   Physical Exam  Constitutional: He is oriented to person, place, and time. He appears well-developed.  HENT:  Mouth/Throat: Oropharynx is clear and moist.  Eyes: Conjunctivae are normal. Pupils are equal, round, and reactive to light.  Neck: Normal range of motion. No JVD present. No thyromegaly present.  Cardiovascular: Normal rate, regular rhythm, normal heart sounds and intact distal pulses.  Exam reveals no gallop and no friction rub.   No murmur heard. Pulmonary/Chest: Effort normal and breath sounds normal. No respiratory distress. He has no wheezes. He has no rales. He exhibits no tenderness.  Abdominal: Soft. Bowel sounds are normal. He exhibits no distension and no mass. There is no tenderness. There is no rebound and no guarding.  Musculoskeletal: Normal range of motion. He exhibits edema (1+ R LE, R knee is swollen). He exhibits no tenderness.  Lymphadenopathy:    He has no cervical adenopathy.  Neurological: He is alert and oriented to person, place, and time. He has normal reflexes. No cranial nerve deficit. He exhibits normal muscle tone. Coordination normal.  Skin: Skin is warm and dry. No rash noted.  Psychiatric: He has a normal mood and affect. His behavior is normal. Judgment and thought content normal.          Assessment & Plan:

## 2010-10-15 NOTE — Assessment & Plan Note (Signed)
Chronic. Better. He stopped Xanax

## 2010-10-15 NOTE — Assessment & Plan Note (Signed)
Continue with current prescription therapy as reflected on the Med list. BP Readings from Last 3 Encounters:  10/09/10 130/68  08/18/10 170/92  08/04/10 108/60

## 2010-10-15 NOTE — Assessment & Plan Note (Signed)
Continue with current prescription therapy as reflected on the Med list.  

## 2010-10-15 NOTE — Assessment & Plan Note (Signed)
Risks associated with using compression socks noncompliance was discussed. Compliance was encouraged.

## 2010-10-15 NOTE — Assessment & Plan Note (Signed)
Better. He is retired now.Dominic KitchenMarland Shaffer

## 2010-10-23 ENCOUNTER — Ambulatory Visit: Payer: BC Managed Care – PPO | Admitting: Cardiovascular Disease

## 2010-10-26 ENCOUNTER — Encounter: Payer: Self-pay | Admitting: Cardiovascular Disease

## 2010-10-26 ENCOUNTER — Ambulatory Visit (INDEPENDENT_AMBULATORY_CARE_PROVIDER_SITE_OTHER): Payer: Medicare Other | Admitting: Cardiovascular Disease

## 2010-10-26 VITALS — BP 135/72 | HR 69 | Ht 70.0 in | Wt 244.8 lb

## 2010-10-26 DIAGNOSIS — Z7901 Long term (current) use of anticoagulants: Secondary | ICD-10-CM | POA: Insufficient documentation

## 2010-10-26 DIAGNOSIS — Z5181 Encounter for therapeutic drug level monitoring: Secondary | ICD-10-CM

## 2010-10-26 DIAGNOSIS — I82409 Acute embolism and thrombosis of unspecified deep veins of unspecified lower extremity: Secondary | ICD-10-CM

## 2010-10-26 DIAGNOSIS — I10 Essential (primary) hypertension: Secondary | ICD-10-CM

## 2010-10-26 DIAGNOSIS — O2230 Deep phlebothrombosis in pregnancy, unspecified trimester: Secondary | ICD-10-CM

## 2010-10-26 NOTE — Assessment & Plan Note (Signed)
Well controlled.  Continue current medications and low sodium Dash type diet.    

## 2010-10-26 NOTE — Patient Instructions (Addendum)
Your physician wants you to follow-up in: ONE YEAR  You will receive a reminder letter in the mail two months in advance. If you don't receive a letter, please call our office to schedule the follow-up appointment.   Your physician has requested that you have a lower extremity venous duplex. This test is an ultrasound of the veins in the legs or arms. It looks at venous blood flow that carries blood from the heart to the legs. Allow one hour for a Lower Venous exam. Allow thirty minutes for an Upper Venous exam. There are no restrictions or special instructions.

## 2010-10-26 NOTE — Assessment & Plan Note (Signed)
F/U duplex to see if clot resolving.  Continue coumadin until end of December No major knee surgery in near future if possible

## 2010-10-26 NOTE — Assessment & Plan Note (Signed)
INR Rx no bleeding problems.  Continue until end of December for DVT documented in June

## 2010-10-26 NOTE — Progress Notes (Signed)
70 yo recent cardiac CT with calcium score 28 and <50% LAD/D1 disease. Subsequently developed RLE DVT. Reviewed [name] from June  ordered by Dr Posey Rea. Thrombus involved CFV,SFV, and popliteal. Started on coumadin.  Discussed pathophysiology and prognosis with Dominic Shaffer. INR have been Rx.  Will need to be on coumadin until at least December  Has meniscal tear in right knee.  Cautioned Dominic Shaffer about needle drainage on coumadin and gave Dominic Shaffer names of good orthopedic doctors if needed  ROS: Denies fever, malais, weight loss, blurry vision, decreased visual acuity, cough, sputum, SOB, hemoptysis, pleuritic pain, palpitaitons, heartburn, abdominal pain, melena, lower extremity edema, claudication, or rash.  All other systems reviewed and negative  General: Affect appropriate Healthy:  appears stated age HEENT: normal Neck supple with no adenopathy JVP normal no bruits no thyromegaly Lungs clear with no wheezing and good diaphragmatic motion Heart:  S1/S2 no murmur,rub, gallop or click PMI normal Abdomen: benighn, BS positve, no tenderness, no AAA no bruit.  No HSM or HJR Distal pulses intact with no bruits Plus one RLE edema with mild varicosities Neuro non-focal Skin warm and dry No muscular weakness   Current Outpatient Prescriptions  Medication Sig Dispense Refill  . amLODipine (NORVASC) 5 MG tablet Take 1 tablet (5 mg total) by mouth daily.  90 tablet  3  . losartan-hydrochlorothiazide (HYZAAR) 100-25 MG per tablet Take 1 tablet by mouth daily.  90 tablet  3  . meloxicam (MOBIC) 15 MG tablet Take 1 tablet (15 mg total) by mouth daily as needed for pain.  30 tablet  1  . omeprazole (PRILOSEC) 40 MG capsule Take 1 capsule (40 mg total) by mouth daily.  30 capsule  3  . terazosin (HYTRIN) 5 MG capsule Take 1 capsule (5 mg total) by mouth at bedtime.  30 capsule  5  . warfarin (COUMADIN) 5 MG tablet Take 1 tablet (5 mg total) by mouth daily.  100 tablet  3  . ALPRAZolam (XANAX) 0.25 MG tablet Take 1  tablet (0.25 mg total) by mouth 2 (two) times daily.  60 tablet  2    Allergies  Iodinated diagnostic agents  Electrocardiogram:  Assessment and Plan

## 2010-10-30 ENCOUNTER — Ambulatory Visit (INDEPENDENT_AMBULATORY_CARE_PROVIDER_SITE_OTHER): Payer: Medicare Other | Admitting: *Deleted

## 2010-10-30 DIAGNOSIS — Z7901 Long term (current) use of anticoagulants: Secondary | ICD-10-CM

## 2010-10-30 DIAGNOSIS — I82409 Acute embolism and thrombosis of unspecified deep veins of unspecified lower extremity: Secondary | ICD-10-CM

## 2010-10-30 LAB — POCT INR: INR: 3.3

## 2010-11-20 ENCOUNTER — Encounter: Payer: Medicare Other | Admitting: *Deleted

## 2010-11-22 ENCOUNTER — Other Ambulatory Visit: Payer: Self-pay | Admitting: Cardiology

## 2010-11-22 DIAGNOSIS — Z86718 Personal history of other venous thrombosis and embolism: Secondary | ICD-10-CM

## 2010-11-23 ENCOUNTER — Ambulatory Visit (INDEPENDENT_AMBULATORY_CARE_PROVIDER_SITE_OTHER): Payer: Medicare Other | Admitting: *Deleted

## 2010-11-23 ENCOUNTER — Encounter (INDEPENDENT_AMBULATORY_CARE_PROVIDER_SITE_OTHER): Payer: Medicare Other | Admitting: Cardiology

## 2010-11-23 DIAGNOSIS — O2230 Deep phlebothrombosis in pregnancy, unspecified trimester: Secondary | ICD-10-CM

## 2010-11-23 DIAGNOSIS — I803 Phlebitis and thrombophlebitis of lower extremities, unspecified: Secondary | ICD-10-CM

## 2010-11-23 DIAGNOSIS — I10 Essential (primary) hypertension: Secondary | ICD-10-CM

## 2010-11-23 DIAGNOSIS — I82409 Acute embolism and thrombosis of unspecified deep veins of unspecified lower extremity: Secondary | ICD-10-CM

## 2010-11-23 DIAGNOSIS — M7989 Other specified soft tissue disorders: Secondary | ICD-10-CM

## 2010-11-23 DIAGNOSIS — Z7901 Long term (current) use of anticoagulants: Secondary | ICD-10-CM

## 2010-11-23 DIAGNOSIS — Z86718 Personal history of other venous thrombosis and embolism: Secondary | ICD-10-CM

## 2010-11-23 LAB — POCT INR: INR: 1.4

## 2010-12-05 ENCOUNTER — Ambulatory Visit (INDEPENDENT_AMBULATORY_CARE_PROVIDER_SITE_OTHER): Payer: Medicare Other | Admitting: *Deleted

## 2010-12-05 DIAGNOSIS — Z7901 Long term (current) use of anticoagulants: Secondary | ICD-10-CM

## 2010-12-05 DIAGNOSIS — I82409 Acute embolism and thrombosis of unspecified deep veins of unspecified lower extremity: Secondary | ICD-10-CM

## 2010-12-05 LAB — POCT INR: INR: 2.4

## 2010-12-07 ENCOUNTER — Encounter: Payer: Medicare Other | Admitting: *Deleted

## 2010-12-26 ENCOUNTER — Ambulatory Visit (INDEPENDENT_AMBULATORY_CARE_PROVIDER_SITE_OTHER): Payer: Medicare Other | Admitting: *Deleted

## 2010-12-26 DIAGNOSIS — I82409 Acute embolism and thrombosis of unspecified deep veins of unspecified lower extremity: Secondary | ICD-10-CM

## 2010-12-26 DIAGNOSIS — Z7901 Long term (current) use of anticoagulants: Secondary | ICD-10-CM

## 2010-12-26 LAB — POCT INR: INR: 2.8

## 2011-01-16 ENCOUNTER — Ambulatory Visit (INDEPENDENT_AMBULATORY_CARE_PROVIDER_SITE_OTHER): Payer: Medicare Other | Admitting: *Deleted

## 2011-01-16 DIAGNOSIS — I82409 Acute embolism and thrombosis of unspecified deep veins of unspecified lower extremity: Secondary | ICD-10-CM

## 2011-01-16 DIAGNOSIS — Z7901 Long term (current) use of anticoagulants: Secondary | ICD-10-CM

## 2011-02-02 ENCOUNTER — Telehealth: Payer: Self-pay | Admitting: Internal Medicine

## 2011-02-02 MED ORDER — LOSARTAN POTASSIUM-HCTZ 100-25 MG PO TABS: 1.0000 | ORAL_TABLET | Freq: Every day | ORAL | Status: DC

## 2011-02-02 NOTE — Telephone Encounter (Signed)
rf sent 

## 2011-02-02 NOTE — Telephone Encounter (Signed)
Pt called and is requesting Losartan (blood pressure med) 100mg  be called into the [facility] pharmacy on [address].   Thanks!

## 2011-02-13 ENCOUNTER — Ambulatory Visit (INDEPENDENT_AMBULATORY_CARE_PROVIDER_SITE_OTHER): Payer: Medicare Other | Admitting: *Deleted

## 2011-02-13 DIAGNOSIS — I82409 Acute embolism and thrombosis of unspecified deep veins of unspecified lower extremity: Secondary | ICD-10-CM

## 2011-02-13 DIAGNOSIS — Z7901 Long term (current) use of anticoagulants: Secondary | ICD-10-CM | POA: Diagnosis not present

## 2011-03-07 ENCOUNTER — Ambulatory Visit (INDEPENDENT_AMBULATORY_CARE_PROVIDER_SITE_OTHER): Payer: Medicare Other | Admitting: Internal Medicine

## 2011-03-07 ENCOUNTER — Encounter: Payer: Self-pay | Admitting: Internal Medicine

## 2011-03-07 ENCOUNTER — Other Ambulatory Visit (INDEPENDENT_AMBULATORY_CARE_PROVIDER_SITE_OTHER): Payer: Medicare Other

## 2011-03-07 VITALS — BP 150/76 | HR 80 | Temp 97.7°F | Resp 16 | Wt 200.0 lb

## 2011-03-07 DIAGNOSIS — M25569 Pain in unspecified knee: Secondary | ICD-10-CM

## 2011-03-07 DIAGNOSIS — R109 Unspecified abdominal pain: Secondary | ICD-10-CM | POA: Insufficient documentation

## 2011-03-07 DIAGNOSIS — R634 Abnormal weight loss: Secondary | ICD-10-CM

## 2011-03-07 DIAGNOSIS — I82409 Acute embolism and thrombosis of unspecified deep veins of unspecified lower extremity: Secondary | ICD-10-CM

## 2011-03-07 DIAGNOSIS — I10 Essential (primary) hypertension: Secondary | ICD-10-CM

## 2011-03-07 LAB — BASIC METABOLIC PANEL
BUN: 15 mg/dL (ref 6–23)
CO2: 28 mEq/L (ref 19–32)
Calcium: 8.8 mg/dL (ref 8.4–10.5)
Creatinine, Ser: 1.1 mg/dL (ref 0.4–1.5)
GFR: 90.08 mL/min (ref >60.00)
Glucose, Bld: 90 mg/dL (ref 70–99)
Sodium: 139 mEq/L (ref 135–145)

## 2011-03-07 LAB — CBC WITH DIFFERENTIAL/PLATELET
Basophils Relative: 0.3 % (ref 0.0–3.0)
Eosinophils Relative: 1.1 % (ref 0.0–5.0)
Hemoglobin: 12.3 g/dL — ABNORMAL LOW (ref 13.0–17.0)
Lymphocytes Relative: 20.9 % (ref 12.0–46.0)
MCHC: 33.1 g/dL (ref 30.0–36.0)
MCV: 79.6 fl (ref 78.0–100.0)
Monocytes Absolute: 0.9 10*3/uL (ref 0.1–1.0)
Neutro Abs: 7.8 10*3/uL — ABNORMAL HIGH (ref 1.4–7.7)
Neutrophils Relative %: 69.4 % (ref 43.0–77.0)
RBC: 4.65 Mil/uL (ref 4.22–5.81)
WBC: 11.3 10*3/uL — ABNORMAL HIGH (ref 4.5–10.5)

## 2011-03-07 LAB — URINALYSIS
Bilirubin Urine: NEGATIVE
Ketones, ur: NEGATIVE
Leukocytes, UA: NEGATIVE
Nitrite: NEGATIVE
Urobilinogen, UA: 0.2 (ref 0.0–1.0)
pH: 6 (ref 5.0–8.0)

## 2011-03-07 MED ORDER — TRAMADOL HCL 50 MG PO TABS: 50.0000 mg | ORAL_TABLET | Freq: Two times a day (BID) | ORAL | Status: AC | PRN

## 2011-03-07 MED ORDER — AMOXICILLIN-POT CLAVULANATE 875-125 MG PO TABS: 1.0000 | ORAL_TABLET | Freq: Two times a day (BID) | ORAL | Status: AC

## 2011-03-07 NOTE — Patient Instructions (Signed)
Low residue diet x 10-14 d

## 2011-03-07 NOTE — Assessment & Plan Note (Signed)
May need to US/tap when off coumadin

## 2011-03-07 NOTE — Progress Notes (Signed)
  Subjective:    Patient ID: Dominic Shaffer, male    DOB: Feb 20, 1940, 71 y.o.   MRN: [id number]  HPI C/o lower abd pain intermittent, worse with - cramps x 2 wks . 1-2 BM/d - normally 3/d. Lost wt on diet   Review of Systems Wt Readings from Last 3 Encounters:  03/07/11 200 lb (90.719 kg)  10/26/10 244 lb 12.8 oz (111.041 kg)  10/09/10 239 lb (108.41 kg)   BP Readings from Last 3 Encounters:  03/07/11 150/76  10/26/10 135/72  10/09/10 130/68        Objective:   Physical Exam        Assessment & Plan:

## 2011-03-08 ENCOUNTER — Encounter: Payer: Self-pay | Admitting: Internal Medicine

## 2011-03-08 ENCOUNTER — Telehealth: Payer: Self-pay | Admitting: Internal Medicine

## 2011-03-08 LAB — HEPATIC FUNCTION PANEL
ALT: 14 U/L (ref 0–53)
Bilirubin, Direct: 0.1 mg/dL (ref 0.0–0.3)
Total Bilirubin: 0.8 mg/dL (ref 0.3–1.2)

## 2011-03-08 NOTE — Assessment & Plan Note (Signed)
Discussed w/pt. We should be able to d/c coumadin soon

## 2011-03-08 NOTE — Assessment & Plan Note (Signed)
Labs Start abx

## 2011-03-08 NOTE — Telephone Encounter (Signed)
Dominic Shaffer, please, inform patient that all labs are normal except for elev WBC Take abx as we planned Thx

## 2011-03-08 NOTE — Assessment & Plan Note (Signed)
Continue with current prescription therapy as reflected on the Med list.  

## 2011-03-09 NOTE — Telephone Encounter (Signed)
Pt informed

## 2011-03-27 ENCOUNTER — Ambulatory Visit (INDEPENDENT_AMBULATORY_CARE_PROVIDER_SITE_OTHER): Payer: Medicare Other | Admitting: *Deleted

## 2011-03-27 DIAGNOSIS — Z7901 Long term (current) use of anticoagulants: Secondary | ICD-10-CM | POA: Diagnosis not present

## 2011-03-27 DIAGNOSIS — I82409 Acute embolism and thrombosis of unspecified deep veins of unspecified lower extremity: Secondary | ICD-10-CM

## 2011-05-10 ENCOUNTER — Telehealth: Payer: Self-pay

## 2011-05-10 NOTE — Telephone Encounter (Signed)
Did he stop coumadin? Thx

## 2011-05-10 NOTE — Telephone Encounter (Signed)
Pt called requesting MD advisement on whether he can restart ASA and if so, at what dose? Please advise

## 2011-05-11 NOTE — Telephone Encounter (Signed)
Pt discontinued coumadin the first week of March.

## 2011-05-12 MED ORDER — ASPIRIN 81 MG PO CHEW: 81.0000 mg | CHEWABLE_TABLET | Freq: Every day | ORAL | Status: AC

## 2011-05-12 NOTE — Telephone Encounter (Signed)
OK to start ASA 81  Thx

## 2011-05-14 NOTE — Telephone Encounter (Signed)
Left mess for patient to call back.  

## 2011-05-14 NOTE — Telephone Encounter (Signed)
Pt informed

## 2011-06-10 ENCOUNTER — Other Ambulatory Visit: Payer: Self-pay | Admitting: Internal Medicine

## 2011-06-20 ENCOUNTER — Encounter: Payer: Self-pay | Admitting: Internal Medicine

## 2011-06-20 ENCOUNTER — Ambulatory Visit (INDEPENDENT_AMBULATORY_CARE_PROVIDER_SITE_OTHER): Payer: Medicare Other | Admitting: Internal Medicine

## 2011-06-20 VITALS — BP 130/80 | HR 80 | Temp 98.2°F | Resp 16

## 2011-06-20 DIAGNOSIS — F439 Reaction to severe stress, unspecified: Secondary | ICD-10-CM

## 2011-06-20 DIAGNOSIS — I10 Essential (primary) hypertension: Secondary | ICD-10-CM

## 2011-06-20 DIAGNOSIS — H9190 Unspecified hearing loss, unspecified ear: Secondary | ICD-10-CM | POA: Diagnosis not present

## 2011-06-20 DIAGNOSIS — Z733 Stress, not elsewhere classified: Secondary | ICD-10-CM

## 2011-06-20 NOTE — Assessment & Plan Note (Signed)
audiol ref

## 2011-06-20 NOTE — Assessment & Plan Note (Signed)
Continue with current prescription therapy as reflected on the Med list.  

## 2011-06-20 NOTE — Assessment & Plan Note (Signed)
Resolved

## 2011-06-20 NOTE — Progress Notes (Signed)
Patient ID: Dominic Shaffer, male   DOB: January 29, 1941, 71 y.o.   MRN: [id number]  Subjective:    Patient ID: Dominic Shaffer, male    DOB: [birth year], 71 y.o.   MRN: [id number]  HPI F/u knee OA, DVT,HTN C/o decreased hearing Lost wt on diet   Review of Systems  Constitutional: Negative for appetite change, fatigue and unexpected weight change.  HENT: Positive for hearing loss (mild ). Negative for nosebleeds, congestion, sore throat, sneezing, trouble swallowing and neck pain.   Eyes: Negative for itching and visual disturbance.  Respiratory: Negative for cough.   Cardiovascular: Negative for chest pain, palpitations and leg swelling.  Gastrointestinal: Negative for nausea, diarrhea, blood in stool and abdominal distention.  Genitourinary: Negative for frequency and hematuria.  Musculoskeletal: Negative for back pain, joint swelling and gait problem.  Skin: Negative for rash.  Neurological: Negative for dizziness, tremors, speech difficulty and weakness.  Psychiatric/Behavioral: Negative for sleep disturbance, dysphoric mood and agitation. The patient is not nervous/anxious.    Wt Readings from Last 3 Encounters:  03/07/11 200 lb (90.719 kg)  10/26/10 244 lb 12.8 oz (111.041 kg)  10/09/10 239 lb (108.41 kg)   BP Readings from Last 3 Encounters:  03/07/11 150/76  10/26/10 135/72  10/09/10 130/68        Objective:   Physical Exam  Constitutional: He is oriented to person, place, and time. He appears well-developed.  HENT:  Mouth/Throat: Oropharynx is clear and moist.  Eyes: Conjunctivae are normal. Pupils are equal, round, and reactive to light.  Neck: Normal range of motion. No JVD present. No thyromegaly present.  Cardiovascular: Normal rate, regular rhythm, normal heart sounds and intact distal pulses.  Exam reveals no gallop and no friction rub.   No murmur heard. Pulmonary/Chest: Effort normal and breath sounds normal. No respiratory distress. He has no wheezes.  He has no rales. He exhibits no tenderness.  Abdominal: Soft. Bowel sounds are normal. He exhibits no distension and no mass. There is no tenderness. There is no rebound and no guarding.  Musculoskeletal: Normal range of motion. He exhibits no edema and no tenderness.  Lymphadenopathy:    He has no cervical adenopathy.  Neurological: He is alert and oriented to person, place, and time. He has normal reflexes. No cranial nerve deficit. He exhibits normal muscle tone. Coordination normal.  Skin: Skin is warm and dry. No rash noted.  Psychiatric: He has a normal mood and affect. His behavior is normal. Judgment and thought content normal.     Lab Results  Component Value Date   WBC 11.3* 03/07/2011   HGB 12.3* 03/07/2011   HCT 37.0* 03/07/2011   PLT 240.0 03/07/2011   GLUCOSE 90 03/07/2011   CHOL 137 04/29/2007   TRIG 79 04/29/2007   HDL 27.9* 04/29/2007   LDLCALC 93 04/29/2007   ALT 14 03/07/2011   AST 14 03/07/2011   NA 139 03/07/2011   K 3.7 03/07/2011   CL 104 03/07/2011   CREATININE 1.1 03/07/2011   BUN 15 03/07/2011   CO2 28 03/07/2011   TSH 1.13 03/07/2011   PSA 1.70 04/29/2007   INR 2.2 03/27/2011        Assessment & Plan:

## 2011-06-21 ENCOUNTER — Encounter: Payer: Self-pay | Admitting: Internal Medicine

## 2011-06-22 ENCOUNTER — Encounter: Payer: Self-pay | Admitting: Internal Medicine

## 2011-06-26 ENCOUNTER — Encounter: Payer: Self-pay | Admitting: Cardiovascular Disease

## 2011-06-26 ENCOUNTER — Ambulatory Visit (INDEPENDENT_AMBULATORY_CARE_PROVIDER_SITE_OTHER): Payer: Medicare Other | Admitting: Cardiovascular Disease

## 2011-06-26 VITALS — BP 119/70 | HR 74 | Ht 70.0 in | Wt 227.0 lb

## 2011-06-26 DIAGNOSIS — R0989 Other specified symptoms and signs involving the circulatory and respiratory systems: Secondary | ICD-10-CM | POA: Diagnosis not present

## 2011-06-26 DIAGNOSIS — I447 Left bundle-branch block, unspecified: Secondary | ICD-10-CM | POA: Diagnosis not present

## 2011-06-26 DIAGNOSIS — I10 Essential (primary) hypertension: Secondary | ICD-10-CM | POA: Diagnosis not present

## 2011-06-26 DIAGNOSIS — R609 Edema, unspecified: Secondary | ICD-10-CM | POA: Diagnosis not present

## 2011-06-26 NOTE — Assessment & Plan Note (Signed)
Stable previous DVT and residual varicosities.  As needed support hose and diuretics

## 2011-06-26 NOTE — Assessment & Plan Note (Signed)
Well controlled.  Continue current medications and low sodium Dash type diet.    

## 2011-06-26 NOTE — Assessment & Plan Note (Signed)
No change in ECG F/U ECG in a year No AV block

## 2011-06-26 NOTE — Progress Notes (Signed)
Addended by: Worthy Rancher D on: 06/26/2011 11:45 AM   Modules accepted: Orders

## 2011-06-26 NOTE — Assessment & Plan Note (Signed)
New to me F/U carotid duplex  ASA

## 2011-06-26 NOTE — Progress Notes (Signed)
Patient ID: Dominic Shaffer, male   DOB: 1940/08/26, 71 y.o.   MRN: [id number] 71 yo recent cardiac CT with calcium score 28 and <50% LAD/D1 disease. Subsequently developed RLE DVT. Reviewed [name] from June ordered by Dr Posey Rea. Thrombus involved CFV,SFV, and popliteal. Started on coumadin. Discussed pathophysiology and prognosis with him. INR have been Rx.  Is now off coumadin since  ROS: Denies fever, malais, weight loss, blurry vision, decreased visual acuity, cough, sputum, SOB, hemoptysis, pleuritic pain, palpitaitons, heartburn, abdominal pain, melena, lower extremity edema, claudication, or rash.  All other systems reviewed and negative  General: Affect appropriate Healthy:  appears stated age HEENT: normal Neck supple with no adenopathy JVP normal right  bruits no thyromegaly Lungs clear with no wheezing and good diaphragmatic motion Heart:  S1/S2 no murmur, no rub, gallop or click PMI normal Abdomen: benighn, BS positve, no tenderness, no AAA no bruit.  No HSM or HJR Distal pulses intact with no bruits Trace  Edema with small bilateral varicose veins Neuro non-focal Skin warm and dry No muscular weakness   Current Outpatient Prescriptions  Medication Sig Dispense Refill  . ALPRAZolam (XANAX) 0.25 MG tablet Take 0.25 mg by mouth at bedtime as needed.      Marland Kitchen amLODipine (NORVASC) 5 MG tablet Take 1 tablet (5 mg total) by mouth daily.  90 tablet  3  . aspirin (ASPIRIN CHILDRENS) 81 MG chewable tablet Chew 1 tablet (81 mg total) by mouth daily.  100 tablet  3  . losartan-hydrochlorothiazide (HYZAAR) 100-25 MG per tablet Take 1 tablet by mouth daily.  90 tablet  3  . meloxicam (MOBIC) 15 MG tablet Take 1 tablet (15 mg total) by mouth daily as needed for pain.  30 tablet  1  . omeprazole (PRILOSEC) 40 MG capsule Take 40 mg by mouth daily as needed.      . terazosin (HYTRIN) 5 MG capsule TAKE ONE CAPSULE BY MOUTH AT BEDTIME  30 capsule  5  . ALPRAZolam (XANAX) 0.25 MG tablet Take  0.25 mg by mouth 2 (two) times daily as needed.        Allergies  Iodinated diagnostic agents  Electrocardiogram:06/13/10 SR LBBB today SR LBBB rate 70 no change  Assessment and Plan

## 2011-06-26 NOTE — Patient Instructions (Signed)
Your physician has requested that you have a carotid duplex. This test is an ultrasound of the carotid arteries in your neck. It looks at blood flow through these arteries that supply the brain with blood. Allow one hour for this exam. There are no restrictions or special instructions.  Your physician wants you to follow-up in: 1 year.  You will receive a reminder letter in the mail two months in advance. If you don't receive a letter, please call our office to schedule the follow-up appointment.  

## 2011-07-02 DIAGNOSIS — H903 Sensorineural hearing loss, bilateral: Secondary | ICD-10-CM | POA: Diagnosis not present

## 2011-07-09 ENCOUNTER — Encounter (INDEPENDENT_AMBULATORY_CARE_PROVIDER_SITE_OTHER): Payer: Medicare Other

## 2011-07-09 DIAGNOSIS — I6529 Occlusion and stenosis of unspecified carotid artery: Secondary | ICD-10-CM | POA: Diagnosis not present

## 2011-07-09 DIAGNOSIS — R0989 Other specified symptoms and signs involving the circulatory and respiratory systems: Secondary | ICD-10-CM

## 2011-07-16 ENCOUNTER — Telehealth: Payer: Self-pay | Admitting: Cardiovascular Disease

## 2011-07-16 NOTE — Telephone Encounter (Signed)
An aspirin a day is the best we can do only 40-59% LICA.  Does not need Plavix  Blood flow to "brain" is fine.

## 2011-07-16 NOTE — Telephone Encounter (Signed)
Pt calling wanting to know if there is any medication to take to improve blood flow to brain--he is concerned due to fact the c. Doppler showed some calcification on left  ICA --advised this result is ok for age and history and no treatment is necessary--pt did not appear satisfied with my answer

## 2011-07-16 NOTE — Telephone Encounter (Signed)
Called Dominic Shaffer back and repeated dr Eden Emms 's advice that if he wanted to stop carotids from further blockage he could have lipids tested and start statin if LDL indicates need--advised pt to call dr plotnikov's office for previous lipid results and they can draw blood if indicated--pt agrees

## 2011-07-16 NOTE — Telephone Encounter (Signed)
Needs fasting lipids to see what cholesterol is ? Has anyone done these recently.  If LDL high starting statin would help prevent carotid blockage from getting worse

## 2011-07-16 NOTE — Telephone Encounter (Signed)
New msg Pt wants to know about test results. Please call to discuss.

## 2011-07-23 ENCOUNTER — Ambulatory Visit: Payer: Self-pay | Admitting: Internal Medicine

## 2011-07-23 DIAGNOSIS — I82409 Acute embolism and thrombosis of unspecified deep veins of unspecified lower extremity: Secondary | ICD-10-CM

## 2011-08-13 ENCOUNTER — Encounter: Payer: Self-pay | Admitting: Internal Medicine

## 2011-10-19 DIAGNOSIS — N401 Enlarged prostate with lower urinary tract symptoms: Secondary | ICD-10-CM | POA: Diagnosis not present

## 2011-10-29 DIAGNOSIS — N529 Male erectile dysfunction, unspecified: Secondary | ICD-10-CM | POA: Diagnosis not present

## 2011-10-29 DIAGNOSIS — Q619 Cystic kidney disease, unspecified: Secondary | ICD-10-CM | POA: Diagnosis not present

## 2011-10-29 DIAGNOSIS — N401 Enlarged prostate with lower urinary tract symptoms: Secondary | ICD-10-CM | POA: Diagnosis not present

## 2012-01-25 ENCOUNTER — Other Ambulatory Visit: Payer: Self-pay | Admitting: Internal Medicine

## 2012-04-21 ENCOUNTER — Ambulatory Visit (INDEPENDENT_AMBULATORY_CARE_PROVIDER_SITE_OTHER): Payer: Medicare Other | Admitting: Psychology

## 2012-04-21 DIAGNOSIS — F4322 Adjustment disorder with anxiety: Secondary | ICD-10-CM

## 2012-04-30 ENCOUNTER — Ambulatory Visit (INDEPENDENT_AMBULATORY_CARE_PROVIDER_SITE_OTHER): Payer: Medicare Other | Admitting: Psychology

## 2012-04-30 DIAGNOSIS — F4322 Adjustment disorder with anxiety: Secondary | ICD-10-CM

## 2012-05-02 ENCOUNTER — Ambulatory Visit (INDEPENDENT_AMBULATORY_CARE_PROVIDER_SITE_OTHER): Payer: Medicare Other | Admitting: Internal Medicine

## 2012-05-02 ENCOUNTER — Other Ambulatory Visit (INDEPENDENT_AMBULATORY_CARE_PROVIDER_SITE_OTHER): Payer: Medicare Other

## 2012-05-02 ENCOUNTER — Encounter: Payer: Self-pay | Admitting: Internal Medicine

## 2012-05-02 DIAGNOSIS — R109 Unspecified abdominal pain: Secondary | ICD-10-CM

## 2012-05-02 DIAGNOSIS — R1030 Lower abdominal pain, unspecified: Secondary | ICD-10-CM | POA: Insufficient documentation

## 2012-05-02 LAB — URINALYSIS
Bilirubin Urine: NEGATIVE
Hgb urine dipstick: NEGATIVE
Ketones, ur: NEGATIVE
Total Protein, Urine: NEGATIVE
Urine Glucose: NEGATIVE

## 2012-05-02 LAB — CBC WITH DIFFERENTIAL/PLATELET
Eosinophils Relative: 1.6 % (ref 0.0–5.0)
HCT: 41 % (ref 39.0–52.0)
Lymphs Abs: 2.5 10*3/uL (ref 0.7–4.0)
MCHC: 32.8 g/dL (ref 30.0–36.0)
MCV: 79.2 fl (ref 78.0–100.0)
Monocytes Absolute: 1 10*3/uL (ref 0.1–1.0)
Platelets: 220 10*3/uL (ref 150.0–400.0)
WBC: 11.8 10*3/uL — ABNORMAL HIGH (ref 4.5–10.5)

## 2012-05-02 MED ORDER — TRAMADOL HCL 50 MG PO TABS: 50.0000 mg | ORAL_TABLET | Freq: Two times a day (BID) | ORAL | Status: DC | PRN

## 2012-05-02 MED ORDER — CIPROFLOXACIN HCL 500 MG PO TABS: 500.0000 mg | ORAL_TABLET | Freq: Two times a day (BID) | ORAL | Status: DC

## 2012-05-02 NOTE — Assessment & Plan Note (Signed)
Labs Cipro

## 2012-05-02 NOTE — Progress Notes (Signed)
   Subjective:    Abdominal Pain Associated symptoms include frequency. Pertinent negatives include no diarrhea, hematuria or nausea.  Urinary Frequency  Associated symptoms include frequency. Pertinent negatives include no hematuria or nausea.   F/u knee OA, DVT,HTN C/o decreased hearing Lost wt on diet   Review of Systems  Constitutional: Negative for appetite change, fatigue and unexpected weight change.  HENT: Positive for hearing loss (mild ). Negative for nosebleeds, congestion, sore throat, sneezing, trouble swallowing and neck pain.   Eyes: Negative for itching and visual disturbance.  Respiratory: Negative for cough.   Cardiovascular: Negative for chest pain, palpitations and leg swelling.  Gastrointestinal: Positive for abdominal pain. Negative for nausea, diarrhea, blood in stool and abdominal distention.  Genitourinary: Positive for frequency. Negative for hematuria.  Musculoskeletal: Negative for back pain, joint swelling and gait problem.  Skin: Negative for rash.  Neurological: Negative for dizziness, tremors, speech difficulty and weakness.  Psychiatric/Behavioral: Negative for sleep disturbance, dysphoric mood and agitation. The patient is not nervous/anxious.    Wt Readings from Last 3 Encounters:  05/02/12 220 lb (99.791 kg)  06/26/11 227 lb (102.967 kg)  03/07/11 200 lb (90.719 kg)   BP Readings from Last 3 Encounters:  05/02/12 130/80  06/26/11 119/70  06/21/11 130/80        Objective:   Physical Exam  Constitutional: He is oriented to person, place, and time. He appears well-developed.  HENT:  Mouth/Throat: Oropharynx is clear and moist.  Eyes: Conjunctivae are normal. Pupils are equal, round, and reactive to light.  Neck: Normal range of motion. No JVD present. No thyromegaly present.  Cardiovascular: Normal rate, regular rhythm, normal heart sounds and intact distal pulses.  Exam reveals no gallop and no friction rub.   No murmur  heard. Pulmonary/Chest: Effort normal and breath sounds normal. No respiratory distress. He has no wheezes. He has no rales. He exhibits no tenderness.  Abdominal: Soft. Bowel sounds are normal. He exhibits no distension and no mass. There is no tenderness. There is no rebound and no guarding.  Musculoskeletal: Normal range of motion. He exhibits no edema and no tenderness.  Lymphadenopathy:    He has no cervical adenopathy.  Neurological: He is alert and oriented to person, place, and time. He has normal reflexes. No cranial nerve deficit. He exhibits normal muscle tone. Coordination normal.  Skin: Skin is warm and dry. No rash noted.  Psychiatric: He has a normal mood and affect. His behavior is normal. Judgment and thought content normal.     Lab Results  Component Value Date   WBC 11.3* 03/07/2011   HGB 12.3* 03/07/2011   HCT 37.0* 03/07/2011   PLT 240.0 03/07/2011   GLUCOSE 90 03/07/2011   CHOL 137 04/29/2007   TRIG 79 04/29/2007   HDL 27.9* 04/29/2007   LDLCALC 93 04/29/2007   ALT 14 03/07/2011   AST 14 03/07/2011   NA 139 03/07/2011   K 3.7 03/07/2011   CL 104 03/07/2011   CREATININE 1.1 03/07/2011   BUN 15 03/07/2011   CO2 28 03/07/2011   TSH 1.13 03/07/2011   PSA 1.70 04/29/2007   INR 2.2 03/27/2011        Assessment & Plan:

## 2012-05-02 NOTE — Patient Instructions (Signed)
Ibuprofen 2 tabs 2-3 times a day with food

## 2012-05-05 LAB — BASIC METABOLIC PANEL
BUN: 14 mg/dL (ref 6–23)
Calcium: 9.1 mg/dL (ref 8.4–10.5)
GFR: 87.84 mL/min (ref >60.00)
Glucose, Bld: 86 mg/dL (ref 70–99)
Sodium: 140 mEq/L (ref 135–145)

## 2012-05-15 ENCOUNTER — Ambulatory Visit: Payer: Medicare Other | Admitting: Psychology

## 2012-05-29 ENCOUNTER — Other Ambulatory Visit: Payer: Self-pay | Admitting: Internal Medicine

## 2012-06-13 ENCOUNTER — Ambulatory Visit: Payer: Medicare Other | Admitting: Internal Medicine

## 2012-06-30 ENCOUNTER — Ambulatory Visit: Payer: Medicare Other | Admitting: Internal Medicine

## 2012-07-07 ENCOUNTER — Encounter: Payer: Self-pay | Admitting: Internal Medicine

## 2012-07-07 ENCOUNTER — Ambulatory Visit (INDEPENDENT_AMBULATORY_CARE_PROVIDER_SITE_OTHER): Payer: Medicare Other | Admitting: Internal Medicine

## 2012-07-07 VITALS — BP 140/90 | HR 76 | Temp 97.9°F | Resp 16 | Wt 218.0 lb

## 2012-07-07 DIAGNOSIS — K4090 Unilateral inguinal hernia, without obstruction or gangrene, not specified as recurrent: Secondary | ICD-10-CM | POA: Diagnosis not present

## 2012-07-07 DIAGNOSIS — R109 Unspecified abdominal pain: Secondary | ICD-10-CM | POA: Diagnosis not present

## 2012-07-07 NOTE — Assessment & Plan Note (Signed)
B hernias

## 2012-07-07 NOTE — Progress Notes (Signed)
Subjective:    Abdominal Cramping This is a new problem. The current episode started more than 1 month ago. The onset quality is gradual. The problem occurs intermittently. The problem has been waxing and waning. The pain is located in the suprapubic region. The pain is mild. The quality of the pain is dull. The abdominal pain does not radiate. Associated symptoms include frequency. Pertinent negatives include no diarrhea, hematochezia, hematuria or nausea. Nothing aggravates the pain. The pain is relieved by nothing. He has tried nothing for the symptoms.  Abdominal Pain Associated symptoms include frequency. Pertinent negatives include no diarrhea, hematochezia, hematuria or nausea.  Urinary Frequency  Associated symptoms include frequency. Pertinent negatives include no hematuria or nausea.   F/u knee OA, DVT,HTN C/o decreased hearing Lost wt on diet   Review of Systems  Constitutional: Negative for appetite change, fatigue and unexpected weight change.  HENT: Positive for hearing loss (mild ). Negative for nosebleeds, congestion, sore throat, sneezing, trouble swallowing and neck pain.   Eyes: Negative for itching and visual disturbance.  Respiratory: Negative for cough.   Cardiovascular: Negative for chest pain, palpitations and leg swelling.  Gastrointestinal: Positive for abdominal pain. Negative for nausea, diarrhea, blood in stool, hematochezia and abdominal distention.  Genitourinary: Positive for frequency. Negative for hematuria.  Musculoskeletal: Negative for back pain, joint swelling and gait problem.  Skin: Negative for rash.  Neurological: Negative for dizziness, tremors, speech difficulty and weakness.  Psychiatric/Behavioral: Negative for sleep disturbance, dysphoric mood and agitation. The patient is not nervous/anxious.    Wt Readings from Last 3 Encounters:  07/07/12 218 lb (98.884 kg)  05/02/12 220 lb (99.791 kg)  06/26/11 227 lb (102.967 kg)   BP Readings  from Last 3 Encounters:  07/07/12 140/90  05/02/12 130/80  06/26/11 119/70        Objective:   Physical Exam  Constitutional: He is oriented to person, place, and time. He appears well-developed.  HENT:  Mouth/Throat: Oropharynx is clear and moist.  Eyes: Conjunctivae are normal. Pupils are equal, round, and reactive to light.  Neck: Normal range of motion. No JVD present. No thyromegaly present.  Cardiovascular: Normal rate, regular rhythm, normal heart sounds and intact distal pulses.  Exam reveals no gallop and no friction rub.   No murmur heard. Pulmonary/Chest: Effort normal and breath sounds normal. No respiratory distress. He has no wheezes. He has no rales. He exhibits no tenderness.  Abdominal: Soft. Bowel sounds are normal. He exhibits mass. He exhibits no distension. There is no tenderness. There is no rebound and no guarding.  B inguinal hernia L>>R, sensitive L of a lemon size  Musculoskeletal: Normal range of motion. He exhibits no edema and no tenderness.  Lymphadenopathy:    He has no cervical adenopathy.  Neurological: He is alert and oriented to person, place, and time. He has normal reflexes. No cranial nerve deficit. He exhibits normal muscle tone. Coordination normal.  Skin: Skin is warm and dry. No rash noted.  3 mm L inner calf pigm papule - no change in 30 years per pt  Psychiatric: He has a normal mood and affect. His behavior is normal. Judgment and thought content normal.     Lab Results  Component Value Date   WBC 11.8* 05/02/2012   HGB 13.4 05/02/2012   HCT 41.0 05/02/2012   PLT 220.0 05/02/2012   GLUCOSE 86 05/02/2012   CHOL 137 04/29/2007   TRIG 79 04/29/2007   HDL 27.9* 04/29/2007   LDLCALC 93  04/29/2007   ALT 14 03/07/2011   AST 14 03/07/2011   NA 140 05/02/2012   K 4.0 05/02/2012   CL 105 05/02/2012   CREATININE 1.1 05/02/2012   BUN 14 05/02/2012   CO2 24 05/02/2012   TSH 1.13 03/07/2011   PSA 1.70 04/29/2007   INR 2.2 03/27/2011        Assessment & Plan:

## 2012-07-13 ENCOUNTER — Other Ambulatory Visit: Payer: Self-pay | Admitting: Internal Medicine

## 2012-07-14 ENCOUNTER — Encounter: Payer: Self-pay | Admitting: Cardiovascular Disease

## 2012-07-22 ENCOUNTER — Ambulatory Visit (INDEPENDENT_AMBULATORY_CARE_PROVIDER_SITE_OTHER): Payer: Medicare Other | Admitting: General Surgery

## 2012-08-05 ENCOUNTER — Ambulatory Visit (INDEPENDENT_AMBULATORY_CARE_PROVIDER_SITE_OTHER): Payer: Medicare Other | Admitting: General Surgery

## 2012-08-18 ENCOUNTER — Ambulatory Visit (INDEPENDENT_AMBULATORY_CARE_PROVIDER_SITE_OTHER): Payer: Medicare Other | Admitting: General Surgery

## 2012-09-09 ENCOUNTER — Ambulatory Visit (INDEPENDENT_AMBULATORY_CARE_PROVIDER_SITE_OTHER): Payer: Medicare Other | Admitting: General Surgery

## 2012-09-09 ENCOUNTER — Encounter (INDEPENDENT_AMBULATORY_CARE_PROVIDER_SITE_OTHER): Payer: Self-pay | Admitting: General Surgery

## 2012-09-09 VITALS — BP 122/60 | HR 76 | Resp 14 | Ht 68.0 in | Wt 220.4 lb

## 2012-09-09 DIAGNOSIS — K4020 Bilateral inguinal hernia, without obstruction or gangrene, not specified as recurrent: Secondary | ICD-10-CM | POA: Diagnosis not present

## 2012-09-09 NOTE — Patient Instructions (Signed)
[facility] _______[facility]  INGUINAL HERNIA REPAIR: POST OP INSTRUCTIONS  Always review your discharge instruction sheet given to you by the facility where your surgery was performed. IF YOU HAVE DISABILITY OR FAMILY LEAVE FORMS, YOU MUST BRING THEM TO THE OFFICE FOR PROCESSING.   DO NOT GIVE THEM TO YOUR DOCTOR.  1. A  prescription for pain medication may be given to you upon discharge.  Take your pain medication as prescribed, if needed.  If narcotic pain medicine is not needed, then you may take acetaminophen (Tylenol) or ibuprofen (Advil) as needed. 2. Take your usually prescribed medications unless otherwise directed. 3. If you need a refill on your pain medication, please contact your pharmacy.  They will contact our office to request authorization. Prescriptions will not be filled after 5 pm or on week-ends. 4. You should follow a light diet the first 24 hours after arrival home, such as soup and crackers, etc.  Be sure to include lots of fluids daily.  Resume your normal diet the day after surgery. 5. Most patients will experience some swelling and bruising in the groin and scrotum.  Ice packs and reclining will help.  Swelling and bruising can take several days to resolve.  6. It is common to experience some constipation if taking pain medication after surgery.  Increasing fluid intake and taking a stool softener (such as Colace) will usually help or prevent this problem from occurring.  A mild laxative (Milk of Magnesia or Miralax) should be taken according to package directions if there are no bowel movements after 48 hours. 7. Unless discharge instructions indicate otherwise, you may remove your bandages 50 hours after surgery, and you may shower at that time.  You may have steri-strips (small skin tapes) in place directly over the incision.  These strips should be left on the skin for 7-10 days.  If your surgeon used skin glue on the incision, you may shower in 24 hours.  The  glue will flake off over the next 2-3 weeks.  Any sutures or staples will be removed at the office during your follow-up visit. 8. ACTIVITIES:  You may resume regular (light) daily activities beginning the next day-such as daily self-care, walking, climbing stairs-gradually increasing activities as tolerated.  You may have sexual intercourse when it is comfortable.  Refrain from any heavy lifting or straining until approved by your doctor. a. You may drive when you are no longer taking prescription pain medication, you can comfortably wear a seatbelt, and you can safely maneuver your car and apply brakes. b. RETURN TO WORK:  __________________________________________________________ 9. You should see your doctor in the office for a follow-up appointment approximately 2-3 weeks after your surgery.  Make sure that you call for this appointment within a day or two after you arrive home to insure a convenient appointment time. 10. OTHER INSTRUCTIONS:  __________________________________________________________________________________________________________________________________________________________________________________________  WHEN TO CALL YOUR DOCTOR: 1. Fever over 101.0 2. Inability to urinate 3. Nausea and/or vomiting 4. Extreme swelling or bruising 5. Continued bleeding from incision. 6. Increased pain, redness, or drainage from the incision  The clinic staff is available to answer your questions during regular business hours.  Please don't hesitate to call and ask to speak to one of the nurses for clinical concerns.  If you have a medical emergency, go to the nearest emergency room or call 911.  A surgeon from [facility] is always on call at the hospital   [address],  [place], [place]  [address] ?  [address], [place], [place]   [address] [phone] ? [phone] ? FAX [phone] Web site: [email or web address]

## 2012-09-09 NOTE — Progress Notes (Signed)
Patient ID: Dominic Shaffer, male   DOB: 03/30/1940, 72 y.o.   MRN: [id number]  Chief Complaint  Patient presents with  . New Evaluation    eval ing hernia    HPI Dominic Shaffer is a 72 y.o. male.   HPI  He is referred by Dr. Posey Rea for evaluation of bilateral inguinal hernias left larger than right.  He's been having some left lower quadrant and groin pain that is relieved by passing gas or having a bowel movement. He also noted a swelling in the left groin area. Dr. Posey Rea saw him and noted him to have a large left inguinal hernia. A smaller right inguinal hernia was noted. He is over here for further evaluation and treatment. No difficulty with urination. No chronic constipation. He is still working.  Past Medical History  Diagnosis Date  . Achalasia   . Hypertension   . LBP (low back pain)   . BPH (benign prostatic hypertrophy)   . Umbilical hernia   . Internal hemorrhoid 2003    Dr. Marina Goodell  . DVT of lower limb, acute 07/28/2010  . Heart murmur     Past Surgical History  Procedure Laterality Date  . Hernia repair      Family History  Problem Relation Age of Onset  . Hypertension Other   . Heart disease Mother 51    CAD  . Cancer Father 30    bladder ca    Social History History  Substance Use Topics  . Smoking status: Former Smoker    Types: Cigarettes    Quit date: 07/03/1965  . Smokeless tobacco: Not on file  . Alcohol Use: No    Allergies  Allergen Reactions  . Iodinated Diagnostic Agents Hives    Current Outpatient Prescriptions  Medication Sig Dispense Refill  . amLODipine (NORVASC) 5 MG tablet TAKE ONE TABLET BY MOUTH EVERY DAY  90 tablet  2  . aspirin 81 MG tablet Take 81 mg by mouth daily.      Marland Kitchen losartan-hydrochlorothiazide (HYZAAR) 100-25 MG per tablet TAKE ONE TABLET BY MOUTH EVERY DAY  90 tablet  1  . terazosin (HYTRIN) 5 MG capsule TAKE ONE CAPSULE BY MOUTH AT BEDTIME  30 capsule  5   No current facility-administered  medications for this visit.    Review of Systems Review of Systems  Constitutional: Negative.   Gastrointestinal: Positive for abdominal pain. Negative for nausea.  Genitourinary: Negative for difficulty urinating.  Hematological: Negative.     Blood pressure 122/60, pulse 76, resp. rate 14, height 5\' 8"  (1.727 m), weight 220 lb 6.4 oz (99.973 kg).  Physical Exam Physical Exam  Constitutional: No distress.  Overweight.  HENT:  Head: Normocephalic and atraumatic.  Cardiovascular: Normal rate and regular rhythm.   Pulmonary/Chest: Effort normal and breath sounds normal.  Abdominal: Soft. There is no tenderness.  Small subumbilical scar.  Genitourinary:  No testicular masses. Large left inguinal hernia descending down in the scrotum which is reducible in the supine position. Small right inguinal hernia felt with a cough.  Neurological: He is alert.  Skin: Skin is warm and dry.    Data Reviewed Dr. Loren Racer note.  Assessment    Bilateral lamina hernias with a large hernia being large and symptomatic. Right-sided hernias asymptomatic. Not a good candidate for laparoscopic repair given the mesh in the periumbilical region.     Plan    Left inguinal hernia with mesh and On-Q pump placement. I do not think he needs  to have a right inguinal hernia repair at this time.   I have explained the procedure, risks, and aftercare of inguinal hernia repair.  Risks include but are not limited to bleeding, infection, wound problems, anesthesia, recurrence, bladder or intestine injury, urinary retention, testicular dysfunction, chronic pain, mesh problems.  Heseems to understand and agrees to proceed.       Dominic Shaffer J 09/09/2012, 4:04 PM

## 2012-10-06 ENCOUNTER — Encounter (HOSPITAL_COMMUNITY): Payer: Self-pay | Admitting: Pharmacy Technician

## 2012-10-07 ENCOUNTER — Telehealth (INDEPENDENT_AMBULATORY_CARE_PROVIDER_SITE_OTHER): Payer: Self-pay | Admitting: *Deleted

## 2012-10-07 NOTE — Telephone Encounter (Signed)
Patient called to report that he has decided he does not want the Q pump.  Patient reports he thinks he would recover better and faster with just PO pain medication.  Patient states he has a high pain tolerance and just feels this is the best decision for him.  Explained to patient that a message will be sent to Dr. Abbey Chatters to make sure that everyone is on the same page.  Patient states understanding and agreeable with plan at this time.

## 2012-10-07 NOTE — Telephone Encounter (Signed)
Called patient back to let him know that Dr. Abbey Chatters is aware of his request.  Patient is understanding and appreciative of update.

## 2012-10-07 NOTE — Telephone Encounter (Signed)
That is fine 

## 2012-10-09 ENCOUNTER — Ambulatory Visit (HOSPITAL_COMMUNITY)
Admission: RE | Admit: 2012-10-09 | Discharge: 2012-10-09 | Disposition: A | Discharge status: Home / Self Care | Payer: Medicare Other | Source: Ambulatory Visit | Attending: General Surgery | Admitting: General Surgery

## 2012-10-09 ENCOUNTER — Encounter (HOSPITAL_COMMUNITY)
Admission: RE | Admit: 2012-10-09 | Discharge: 2012-10-09 | Disposition: A | Discharge status: Home / Self Care | Payer: Medicare Other | Source: Ambulatory Visit | Attending: General Surgery | Admitting: General Surgery

## 2012-10-09 ENCOUNTER — Encounter (HOSPITAL_COMMUNITY): Payer: Self-pay

## 2012-10-09 DIAGNOSIS — Z01812 Encounter for preprocedural laboratory examination: Secondary | ICD-10-CM | POA: Insufficient documentation

## 2012-10-09 DIAGNOSIS — K4090 Unilateral inguinal hernia, without obstruction or gangrene, not specified as recurrent: Secondary | ICD-10-CM | POA: Insufficient documentation

## 2012-10-09 DIAGNOSIS — I10 Essential (primary) hypertension: Secondary | ICD-10-CM | POA: Diagnosis not present

## 2012-10-09 DIAGNOSIS — Z01818 Encounter for other preprocedural examination: Secondary | ICD-10-CM | POA: Diagnosis not present

## 2012-10-09 DIAGNOSIS — Z01810 Encounter for preprocedural cardiovascular examination: Secondary | ICD-10-CM | POA: Insufficient documentation

## 2012-10-09 HISTORY — DX: Left bundle-branch block, unspecified: I44.7

## 2012-10-09 LAB — CBC WITH DIFFERENTIAL/PLATELET
Basophils Absolute: 0 10*3/uL (ref 0.0–0.1)
Lymphocytes Relative: 18 % (ref 12–46)
Lymphs Abs: 1.6 10*3/uL (ref 0.7–4.0)
MCV: 81.3 fL (ref 78.0–100.0)
Neutro Abs: 6.4 10*3/uL (ref 1.7–7.7)
Neutrophils Relative %: 72 % (ref 43–77)
Platelets: 232 10*3/uL (ref 150–400)
RBC: 5.2 MIL/uL (ref 4.22–5.81)
WBC: 8.9 10*3/uL (ref 4.0–10.5)

## 2012-10-09 LAB — COMPREHENSIVE METABOLIC PANEL
ALT: 12 U/L (ref 0–53)
AST: 12 U/L (ref 0–37)
Alkaline Phosphatase: 87 U/L (ref 39–117)
CO2: 29 mEq/L (ref 19–32)
Chloride: 99 mEq/L (ref 96–112)
GFR calc Af Amer: 80 mL/min — ABNORMAL LOW (ref >90)
GFR calc non Af Amer: 69 mL/min — ABNORMAL LOW (ref >90)
Glucose, Bld: 93 mg/dL (ref 70–99)
Potassium: 4.3 mEq/L (ref 3.5–5.1)
Sodium: 134 mEq/L — ABNORMAL LOW (ref 135–145)

## 2012-10-09 NOTE — Pre-Procedure Instructions (Signed)
EKG AND CXR WERE DONE TODAY - PREOP AT [facility]. 

## 2012-10-09 NOTE — Patient Instructions (Addendum)
YOUR SURGERY IS SCHEDULED AT [facility]  ON:  Monday  9/15  REPORT TO [facility] AT:  5:30 AM      PHONE # FOR SHORT STAY IS [phone]  DO NOT EAT OR DRINK ANYTHING AFTER MIDNIGHT THE NIGHT BEFORE YOUR SURGERY.  YOU MAY BRUSH YOUR TEETH, RINSE OUT YOUR MOUTH--BUT NO WATER, NO FOOD, NO CHEWING GUM, NO MINTS, NO CANDIES, NO CHEWING TOBACCO.  PLEASE TAKE THE FOLLOWING MEDICATIONS THE AM OF YOUR SURGERY WITH A FEW SIPS OF WATER:  AMLODIPINE   DO NOT BRING VALUABLES, MONEY, CREDIT CARDS.  DO NOT WEAR JEWELRY, MAKE-UP, NAIL POLISH AND NO METAL PINS OR CLIPS IN YOUR HAIR. CONTACT LENS, DENTURES / PARTIALS, GLASSES SHOULD NOT BE WORN TO SURGERY AND IN MOST CASES-HEARING AIDS WILL NEED TO BE REMOVED.  BRING YOUR GLASSES CASE, ANY EQUIPMENT NEEDED FOR YOUR CONTACT LENS. FOR PATIENTS ADMITTED TO THE HOSPITAL--CHECK OUT TIME THE DAY OF DISCHARGE IS 11:00 AM.  ALL INPATIENT ROOMS ARE PRIVATE - WITH BATHROOM, TELEPHONE, TELEVISION AND WIFI INTERNET.  IF YOU ARE BEING DISCHARGED THE SAME DAY OF YOUR SURGERY--YOU CAN NOT DRIVE YOURSELF HOME--AND SHOULD NOT GO HOME ALONE BY TAXI OR BUS.  NO DRIVING OR OPERATING MACHINERY FOR 24 HOURS FOLLOWING ANESTHESIA / PAIN MEDICATIONS.  PLEASE MAKE ARRANGEMENTS FOR SOMEONE TO BE WITH YOU AT HOME THE FIRST 24 HOURS AFTER SURGERY. RESPONSIBLE DRIVER'S NAME  DIANE Karg - PT'S WIFE WILL BE WITH HIM                                               PHONE #   [phone]                            FAILURE TO FOLLOW THESE INSTRUCTIONS MAY RESULT IN THE CANCELLATION OF YOUR SURGERY.   PATIENT SIGNATURE_________________________________

## 2012-10-13 ENCOUNTER — Encounter (HOSPITAL_COMMUNITY): Payer: Self-pay | Admitting: Anesthesiology

## 2012-10-13 ENCOUNTER — Encounter (HOSPITAL_COMMUNITY)
Admission: RE | Disposition: A | Discharge status: Home / Self Care | Payer: Self-pay | Source: Ambulatory Visit | Attending: General Surgery

## 2012-10-13 ENCOUNTER — Ambulatory Visit (HOSPITAL_COMMUNITY): Payer: Medicare Other | Admitting: Anesthesiology

## 2012-10-13 ENCOUNTER — Ambulatory Visit (HOSPITAL_COMMUNITY)
Admission: RE | Admit: 2012-10-13 | Discharge: 2012-10-13 | Disposition: A | Discharge status: Home / Self Care | Payer: Medicare Other | Source: Ambulatory Visit | Attending: General Surgery | Admitting: General Surgery

## 2012-10-13 DIAGNOSIS — I10 Essential (primary) hypertension: Secondary | ICD-10-CM | POA: Insufficient documentation

## 2012-10-13 DIAGNOSIS — K648 Other hemorrhoids: Secondary | ICD-10-CM | POA: Diagnosis not present

## 2012-10-13 DIAGNOSIS — N400 Enlarged prostate without lower urinary tract symptoms: Secondary | ICD-10-CM | POA: Insufficient documentation

## 2012-10-13 DIAGNOSIS — Z79899 Other long term (current) drug therapy: Secondary | ICD-10-CM | POA: Diagnosis not present

## 2012-10-13 DIAGNOSIS — I447 Left bundle-branch block, unspecified: Secondary | ICD-10-CM | POA: Diagnosis not present

## 2012-10-13 DIAGNOSIS — Z7982 Long term (current) use of aspirin: Secondary | ICD-10-CM | POA: Insufficient documentation

## 2012-10-13 DIAGNOSIS — K4090 Unilateral inguinal hernia, without obstruction or gangrene, not specified as recurrent: Secondary | ICD-10-CM | POA: Insufficient documentation

## 2012-10-13 DIAGNOSIS — Z86718 Personal history of other venous thrombosis and embolism: Secondary | ICD-10-CM | POA: Insufficient documentation

## 2012-10-13 DIAGNOSIS — K220 Achalasia of cardia: Secondary | ICD-10-CM | POA: Insufficient documentation

## 2012-10-13 DIAGNOSIS — I479 Paroxysmal tachycardia, unspecified: Secondary | ICD-10-CM | POA: Diagnosis not present

## 2012-10-13 HISTORY — PX: INGUINAL HERNIA REPAIR: SHX194

## 2012-10-13 HISTORY — PX: INSERTION OF MESH: SHX5868

## 2012-10-13 SURGERY — REPAIR, HERNIA, INGUINAL, ADULT
Anesthesia: General | Laterality: Left | Wound class: Clean

## 2012-10-13 MED ORDER — 0.9 % SODIUM CHLORIDE (POUR BTL) OPTIME
TOPICAL | Status: DC | PRN
  Administered 2012-10-13: 1000 mL

## 2012-10-13 MED ORDER — DEXAMETHASONE SODIUM PHOSPHATE 10 MG/ML IJ SOLN
INTRAMUSCULAR | Status: DC | PRN
  Administered 2012-10-13: 10 mg via INTRAVENOUS

## 2012-10-13 MED ORDER — KETOROLAC TROMETHAMINE 15 MG/ML IJ SOLN
INTRAMUSCULAR | Status: DC | PRN
  Administered 2012-10-13: 15 mg via INTRAVENOUS

## 2012-10-13 MED ORDER — PROPOFOL 10 MG/ML IV BOLUS
INTRAVENOUS | Status: DC | PRN
  Administered 2012-10-13: 50 mg via INTRAVENOUS
  Administered 2012-10-13: 150 mg via INTRAVENOUS

## 2012-10-13 MED ORDER — MORPHINE SULFATE 10 MG/ML IJ SOLN: 2.0000 mg | INTRAMUSCULAR | Status: DC | PRN

## 2012-10-13 MED ORDER — OXYCODONE HCL 5 MG PO TABS
5.0000 mg | ORAL_TABLET | ORAL | Status: DC | PRN
  Administered 2012-10-13: 5 mg via ORAL
  Filled 2012-10-13: qty 1

## 2012-10-13 MED ORDER — ACETAMINOPHEN 325 MG PO TABS: 650.0000 mg | ORAL_TABLET | ORAL | Status: DC | PRN

## 2012-10-13 MED ORDER — CEFAZOLIN SODIUM-DEXTROSE 2-3 GM-% IV SOLR
2.0000 g | INTRAVENOUS | Status: AC
  Administered 2012-10-13: 2 g via INTRAVENOUS

## 2012-10-13 MED ORDER — ONDANSETRON HCL 4 MG/2ML IJ SOLN: 4.0000 mg | Freq: Four times a day (QID) | INTRAMUSCULAR | Status: DC | PRN

## 2012-10-13 MED ORDER — LACTATED RINGERS IV SOLN
INTRAVENOUS | Status: DC | PRN
  Administered 2012-10-13: 07:00:00 via INTRAVENOUS

## 2012-10-13 MED ORDER — FENTANYL CITRATE 0.05 MG/ML IJ SOLN
INTRAMUSCULAR | Status: DC | PRN
  Administered 2012-10-13 (×3): 25 ug via INTRAVENOUS
  Administered 2012-10-13: 50 ug via INTRAVENOUS
  Administered 2012-10-13 (×5): 25 ug via INTRAVENOUS

## 2012-10-13 MED ORDER — BUPIVACAINE 0.5 % ON-Q PUMP SINGLE CATH 100 ML
100.0000 mL | INJECTION | Status: DC
  Filled 2012-10-13: qty 100

## 2012-10-13 MED ORDER — SODIUM CHLORIDE 0.9 % IJ SOLN: 3.0000 mL | INTRAMUSCULAR | Status: DC | PRN

## 2012-10-13 MED ORDER — BUPIVACAINE-EPINEPHRINE 0.5% -1:200000 IJ SOLN
INTRAMUSCULAR | Status: AC
  Filled 2012-10-13: qty 1

## 2012-10-13 MED ORDER — ONDANSETRON HCL 4 MG/2ML IJ SOLN
INTRAMUSCULAR | Status: DC | PRN
  Administered 2012-10-13: 4 mg via INTRAVENOUS

## 2012-10-13 MED ORDER — PROMETHAZINE HCL 25 MG/ML IJ SOLN: 6.2500 mg | INTRAMUSCULAR | Status: DC | PRN

## 2012-10-13 MED ORDER — CEFAZOLIN SODIUM-DEXTROSE 2-3 GM-% IV SOLR
INTRAVENOUS | Status: AC
  Filled 2012-10-13: qty 50

## 2012-10-13 MED ORDER — FENTANYL CITRATE 0.05 MG/ML IJ SOLN: 25.0000 ug | INTRAMUSCULAR | Status: DC | PRN

## 2012-10-13 MED ORDER — OXYCODONE HCL 5 MG PO TABS: 5.0000 mg | ORAL_TABLET | ORAL | Status: DC | PRN

## 2012-10-13 MED ORDER — ACETAMINOPHEN 650 MG RE SUPP
650.0000 mg | RECTAL | Status: DC | PRN
  Filled 2012-10-13: qty 1

## 2012-10-13 MED ORDER — BUPIVACAINE-EPINEPHRINE 0.5% -1:200000 IJ SOLN
INTRAMUSCULAR | Status: DC | PRN
  Administered 2012-10-13: 15 mL

## 2012-10-13 SURGICAL SUPPLY — 42 items
BENZOIN TINCTURE PRP APPL 2/3 (GAUZE/BANDAGES/DRESSINGS) ×2 IMPLANT
BLADE HEX COATED 2.75 (ELECTRODE) ×2 IMPLANT
BLADE SURG 15 STRL LF DISP TIS (BLADE) ×1 IMPLANT
BLADE SURG 15 STRL SS (BLADE) ×1
BLADE SURG SZ10 CARB STEEL (BLADE) ×2 IMPLANT
CANISTER SUCTION 2500CC (MISCELLANEOUS) ×2 IMPLANT
CLOTH BEACON ORANGE TIMEOUT ST (SAFETY) ×2 IMPLANT
DECANTER SPIKE VIAL GLASS SM (MISCELLANEOUS) ×2 IMPLANT
DRAIN PENROSE 18X1/2 LTX STRL (DRAIN) ×2 IMPLANT
DRAPE INCISE IOBAN 66X45 STRL (DRAPES) ×2 IMPLANT
DRAPE LAPAROTOMY TRNSV 102X78 (DRAPE) ×2 IMPLANT
DRAPE UTILITY XL STRL (DRAPES) ×2 IMPLANT
DRESSING TELFA 8X3 (GAUZE/BANDAGES/DRESSINGS) IMPLANT
DRSG TEGADERM 2-3/8X2-3/4 SM (GAUZE/BANDAGES/DRESSINGS) IMPLANT
DRSG TEGADERM 4X4.75 (GAUZE/BANDAGES/DRESSINGS) ×2 IMPLANT
ELECT REM PT RETURN 9FT ADLT (ELECTROSURGICAL) ×2
ELECTRODE REM PT RTRN 9FT ADLT (ELECTROSURGICAL) ×1 IMPLANT
GLOVE ECLIPSE 8.0 STRL XLNG CF (GLOVE) ×2 IMPLANT
GLOVE INDICATOR 8.0 STRL GRN (GLOVE) ×4 IMPLANT
GOWN PREVENTION PLUS LG XLONG (DISPOSABLE) ×2 IMPLANT
GOWN STRL REIN XL XLG (GOWN DISPOSABLE) ×4 IMPLANT
KIT BASIN OR (CUSTOM PROCEDURE TRAY) ×2 IMPLANT
MESH HERNIA 3X6 (Mesh General) ×2 IMPLANT
NEEDLE HYPO 25X1 1.5 SAFETY (NEEDLE) ×2 IMPLANT
NS IRRIG 1000ML POUR BTL (IV SOLUTION) ×2 IMPLANT
PACK BASIC VI WITH GOWN DISP (CUSTOM PROCEDURE TRAY) ×2 IMPLANT
PENCIL BUTTON HOLSTER BLD 10FT (ELECTRODE) ×2 IMPLANT
PUMP ON Q 100MLX2ML/HR (PAIN MANAGEMENT) IMPLANT
SPONGE GAUZE 4X4 12PLY (GAUZE/BANDAGES/DRESSINGS) ×2 IMPLANT
SPONGE LAP 4X18 X RAY DECT (DISPOSABLE) ×2 IMPLANT
STRIP CLOSURE SKIN 1/2X4 (GAUZE/BANDAGES/DRESSINGS) ×4 IMPLANT
SUT MNCRL AB 4-0 PS2 18 (SUTURE) ×2 IMPLANT
SUT PROLENE 2 0 CT2 30 (SUTURE) ×4 IMPLANT
SUT VIC AB 2-0 SH 18 (SUTURE) ×2 IMPLANT
SUT VIC AB 3-0 54XBRD REEL (SUTURE) ×1 IMPLANT
SUT VIC AB 3-0 BRD 54 (SUTURE) ×1
SUT VIC AB 3-0 SH 27 (SUTURE) ×2
SUT VIC AB 3-0 SH 27XBRD (SUTURE) ×2 IMPLANT
SYR BULB IRRIGATION 50ML (SYRINGE) ×2 IMPLANT
SYR CONTROL 10ML LL (SYRINGE) ×2 IMPLANT
TOWEL OR 17X26 10 PK STRL BLUE (TOWEL DISPOSABLE) ×2 IMPLANT
YANKAUER SUCT BULB TIP 10FT TU (MISCELLANEOUS) ×2 IMPLANT

## 2012-10-13 NOTE — Preoperative (Signed)
Beta Blockers   Reason not to administer Beta Blockers:Not Applicable 

## 2012-10-13 NOTE — Anesthesia Postprocedure Evaluation (Signed)
  Anesthesia Post-op Note  Patient: Dominic Shaffer  Procedure(s) Performed: Procedure(s) (LRB): OPEN LEFT INGUINAL HERNIA REPAIR WITH ON Q PUMP (Left) INSERTION OF MESH (Left)  Patient Location: PACU  Anesthesia Type: General  Level of Consciousness: awake and alert   Airway and Oxygen Therapy: Patient Spontanous Breathing  Post-op Pain: mild  Post-op Assessment: Post-op Vital signs reviewed, Patient's Cardiovascular Status Stable, Respiratory Function Stable, Patent Airway and No signs of Nausea or vomiting  Last Vitals:  Filed Vitals:   10/13/12 1053  BP: 130/57  Pulse: 64  Temp:   Resp: 16    Post-op Vital Signs: stable   Complications: No apparent anesthesia complications

## 2012-10-13 NOTE — Anesthesia Preprocedure Evaluation (Addendum)
Anesthesia Evaluation  Patient identified by MRN, date of birth, ID band Patient awake    Reviewed: Allergy & Precautions, H&P , NPO status , Patient's Chart, lab work & pertinent test results  Airway Mallampati: II TM Distance: >3 FB Neck ROM: Full    Dental no notable dental hx.    Pulmonary former smoker,  CXR report reviewed. No acute abnormalities. breath sounds clear to auscultation  Pulmonary exam normal       Cardiovascular Exercise Tolerance: Good hypertension, Pt. on medications + Peripheral Vascular Disease negative cardio ROS  + dysrhythmias + Valvular Problems/Murmurs Rhythm:Regular Rate:Normal  ECG: LBBB (old since before 2012)   Neuro/Psych negative neurological ROS  negative psych ROS   GI/Hepatic negative GI ROS, Neg liver ROS,   Endo/Other  negative endocrine ROS  Renal/GU Renal disease  negative genitourinary   Musculoskeletal negative musculoskeletal ROS (+)   Abdominal (+) + obese,   Peds negative pediatric ROS (+)  Hematology negative hematology ROS (+)   Anesthesia Other Findings   Reproductive/Obstetrics negative OB ROS                         Anesthesia Physical Anesthesia Plan  ASA: II  Anesthesia Plan: General   Post-op Pain Management:    Induction: Intravenous  Airway Management Planned: LMA  Additional Equipment:   Intra-op Plan:   Post-operative Plan: Extubation in OR  Informed Consent: I have reviewed the patients History and Physical, chart, labs and discussed the procedure including the risks, benefits and alternatives for the proposed anesthesia with the patient or authorized representative who has indicated his/her understanding and acceptance.   Dental advisory given  Plan Discussed with: CRNA  Anesthesia Plan Comments:         Anesthesia Quick Evaluation

## 2012-10-13 NOTE — Op Note (Signed)
Preoperative diagnosis:  Left inguinal hernia.  Postoperative diagnosis:  Same (indirect)  Procedure:  Left inguinal hernia repair with mesh.  Surgeon:  Avel Peace, M.D.  Anesthesia:  General with local (Marcaine).  Indication:  This is a 72 year old male with left groin discomfort who has a moderate to large LIH.  He now presents for repair.  Technique:  He was seen in the holding room and the left groin was marked with my initials. He was brought to the operating, placed supine on the operating table, and the anesthetic was administered by the anesthesiologist. The hair in the left groin area was clipped as was felt to be necessary. This area was then sterilely prepped and draped.  Local anesthetic was infiltrated in the superficial and deep tissues in the left groin.  An incision was made through the skin and subcutaneous tissue until the external oblique aponeurosis was identified.  Local anesthetic was infiltrated deep to the external oblique aponeurosis. The external oblique aponeurosis was divided through the external ring medially and back toward the anterior superior iliac spine laterally. Using blunt dissection, the shelving edge of the inguinal ligament was identified inferiorly and the internal oblique aponeurosis and muscle were identified superiorly. The ilioinguinal nerve was identified and preserved.  The spermatic cord was isolated and a posterior window was made around it. A large indirect hernia sac was identified and separated from the spermatic cord using blunt dissection. The hernia sac and its contents were reduced through the indirect hernia defect.  The patulous internal ring was tightened with a 2-0 Vicryl suture to keep the hernia contents reduced.   A piece of 3" x 6" polypropylene mesh was brought into the field and anchored 1-2 cm medial to the pubic tubercle with 2-0 Prolene suture. The inferior aspect of the mesh was anchored to the shelving edge of the inguinal  ligament with running 2-0 Prolene suture to a level 1-2 cm lateral to the internal ring. A slit was cut in the mesh creating 2 tails. These were wrapped around the spermatic cord. The superior aspect of the mesh was anchored to the internal oblique aponeurosis and muscle with interrupted 2-0 Vicryl sutures. The 2 tails of the mesh were then crossed creating a new internal ring and were anchored to the shelving edge of the inguinal ligament with 2-0 Prolene suture. The tip of a hemostat could be placed through the new aperture. The lateral aspect of the mesh was then tucked deep to the external oblique aponeurosis.  The wound was inspected and hemostasis was adequate. The external oblique aponeurosis was then closed over the mesh and cord with running 3-0 Vicryl suture. The subcutaneous tissue was closed with running 3-0 Vicryl suture. The skin closed with a running 4-0 Monocryl subcuticular stitch.  Steri-Strips and a sterile dressing were applied.  The procedure was well-tolerated without any apparent complications and the patient was taken to the recovery room in satisfactory condition.

## 2012-10-13 NOTE — H&P (Signed)
Dominic Shaffer is an 72 y.o. male.   Chief Complaint: Elective hernia repair HPI:  He's been having some left lower quadrant and groin pain that is relieved by passing gas or having a bowel movement. He also noted a swelling in the left groin area. Dr. Posey Rea saw him and noted him to have a large left inguinal hernia. A smaller right inguinal hernia was noted.    Past Medical History  Diagnosis Date  . Achalasia   . Hypertension   . LBP (low back pain)   . BPH (benign prostatic hypertrophy)   . Internal hemorrhoid 2003    Dr. Marina Goodell  . DVT of lower limb, acute 07/28/2010  . Heart murmur   . Left inguinal hernia     CAUSING DISCOMFORT  . LBBB (left bundle branch block) 10/09/12    ON EKG- PREVIOUS EKG'S IN EPIC FROM MAY 2013 AND MAY 2012 ALSO SHOW LEFT BUNDLE BRANCH BLOCK    Past Surgical History  Procedure Laterality Date  . Surgery for achalasia    . Hernia repair      REPAIR OF UMBILICAL HERNIA    Family History  Problem Relation Age of Onset  . Hypertension Other   . Heart disease Mother 46    CAD  . Cancer Father 55    bladder ca   Social History:  reports that he quit smoking about 47 years ago. His smoking use included Cigarettes. He smoked 0.00 packs per day. He has never used smokeless tobacco. He reports that he does not drink alcohol or use illicit drugs.  Allergies:  Allergies  Allergen Reactions  . Iodinated Diagnostic Agents Hives  . Shellfish Allergy     HIVES    Medications Prior to Admission  Medication Sig Dispense Refill  . amLODipine (NORVASC) 5 MG tablet Take 5 mg by mouth every morning.      . terazosin (HYTRIN) 5 MG capsule Take 5 mg by mouth at bedtime.      Marland Kitchen aspirin 81 MG tablet Take 81 mg by mouth daily.      Marland Kitchen losartan-hydrochlorothiazide (HYZAAR) 100-25 MG per tablet Take 1 tablet by mouth every morning.        No results found for this or any previous visit (from the past 48 hour(s)). No results found.  Review of Systems   Constitutional: Negative for fever and chills.  Genitourinary:       Left groin discomfort.    Blood pressure 134/70, pulse 66, temperature 98.2 F (36.8 C), temperature source Oral, resp. rate 18, SpO2 100.00%. Physical Exam  Constitutional:  Overweight male.  Cardiovascular: Normal rate and regular rhythm.   Respiratory: Effort normal and breath sounds normal.  GI: Soft. There is no tenderness.  Multiple small scars.  Genitourinary:  Very small right inguinal bulge.  Moderate left inguinal bulge.  Musculoskeletal: He exhibits no edema.  Neurological: He is alert.     Assessment/Plan Symptomatic left inguinal hernia.  Very small right inguinal hernia.  Plan:  Left inguinal hernia repair with mesh.  He does not want the On Q pump.  Halcyon Heck J 10/13/2012, 7:31 AM

## 2012-10-13 NOTE — Transfer of Care (Signed)
Immediate Anesthesia Transfer of Care Note  Patient: Dominic Shaffer  Procedure(s) Performed: Procedure(s): OPEN LEFT INGUINAL HERNIA REPAIR WITH ON Q PUMP (Left) INSERTION OF MESH (Left)  Patient Location: PACU  Anesthesia Type:General  Level of Consciousness: awake and patient cooperative  Airway & Oxygen Therapy: Patient Spontanous Breathing and Patient connected to face mask oxygen  Post-op Assessment: Report given to PACU RN and Post -op Vital signs reviewed and stable  Post vital signs: Reviewed and stable  Complications: No apparent anesthesia complications

## 2012-10-14 ENCOUNTER — Encounter (HOSPITAL_COMMUNITY): Payer: Self-pay | Admitting: General Surgery

## 2012-10-17 ENCOUNTER — Telehealth (INDEPENDENT_AMBULATORY_CARE_PROVIDER_SITE_OTHER): Payer: Self-pay

## 2012-10-17 NOTE — Telephone Encounter (Signed)
Called pt in response to his medication refill request.  He has decided he does not need a refill.  He is taking Advil and it is helping.

## 2012-10-21 ENCOUNTER — Telehealth (INDEPENDENT_AMBULATORY_CARE_PROVIDER_SITE_OTHER): Payer: Self-pay

## 2012-10-21 NOTE — Telephone Encounter (Signed)
Pt calling to request we move his post op appointment up.  Appt was rescheduled.  He also stated that he has been running around a 99.0 degree temperature off and on since this past Sunday.  He has no chills, no malaise, bowels are moving and he is urinating fine.  Told him to monitor his temp and drink plenty of fluids, and we will call him with any instructions from Dr. Abbey Chatters.

## 2012-10-21 NOTE — Telephone Encounter (Signed)
Make sure he is taking frequent walks as well as taking 10 deep breaths every hour while awake.

## 2012-10-28 ENCOUNTER — Encounter (INDEPENDENT_AMBULATORY_CARE_PROVIDER_SITE_OTHER): Payer: Self-pay

## 2012-10-28 ENCOUNTER — Ambulatory Visit (INDEPENDENT_AMBULATORY_CARE_PROVIDER_SITE_OTHER): Payer: Medicare Other | Admitting: General Surgery

## 2012-10-28 ENCOUNTER — Encounter (INDEPENDENT_AMBULATORY_CARE_PROVIDER_SITE_OTHER): Payer: Self-pay | Admitting: General Surgery

## 2012-10-28 VITALS — BP 118/58 | HR 62 | Resp 14 | Ht 70.0 in | Wt 216.6 lb

## 2012-10-28 DIAGNOSIS — Z9889 Other specified postprocedural states: Secondary | ICD-10-CM

## 2012-10-28 NOTE — Progress Notes (Signed)
Procedure:  Left inguinal hernia repair with mesh  Date:  10/13/2012  Pathology:  na  History:  He is here for his first postoperative visit and is doing well.  Exam: General- Is in NAD.  Left groin-incision is clean and intact. Hernia repair is solid.  Assessment:  Doing well following left inguinal hernia repair with mesh.  Plan:  Continue light activities until 6 weeks after surgery can resume normal activities as tolerated. We discussed what this meant. Follow up prn.

## 2012-10-28 NOTE — Patient Instructions (Signed)
6 weeks after the date of the surgery, resume normal activities as tolerated as we discussed.

## 2012-11-03 ENCOUNTER — Encounter (INDEPENDENT_AMBULATORY_CARE_PROVIDER_SITE_OTHER): Payer: Medicare Other | Admitting: General Surgery

## 2013-04-15 ENCOUNTER — Ambulatory Visit: Payer: Medicare Other | Admitting: Internal Medicine

## 2013-04-15 ENCOUNTER — Encounter: Payer: Self-pay | Admitting: Internal Medicine

## 2013-04-15 ENCOUNTER — Ambulatory Visit (INDEPENDENT_AMBULATORY_CARE_PROVIDER_SITE_OTHER): Payer: Medicare Other | Admitting: Internal Medicine

## 2013-04-15 VITALS — BP 140/72 | HR 72 | Temp 98.5°F | Resp 16 | Wt 221.0 lb

## 2013-04-15 DIAGNOSIS — I10 Essential (primary) hypertension: Secondary | ICD-10-CM | POA: Diagnosis not present

## 2013-04-15 DIAGNOSIS — I8310 Varicose veins of unspecified lower extremity with inflammation: Secondary | ICD-10-CM

## 2013-04-15 DIAGNOSIS — I872 Venous insufficiency (chronic) (peripheral): Secondary | ICD-10-CM | POA: Insufficient documentation

## 2013-04-15 DIAGNOSIS — K0889 Other specified disorders of teeth and supporting structures: Secondary | ICD-10-CM | POA: Insufficient documentation

## 2013-04-15 DIAGNOSIS — R609 Edema, unspecified: Secondary | ICD-10-CM | POA: Diagnosis not present

## 2013-04-15 DIAGNOSIS — K089 Disorder of teeth and supporting structures, unspecified: Secondary | ICD-10-CM | POA: Diagnosis not present

## 2013-04-15 MED ORDER — VASCULERA PO TABS: 1.0000 | ORAL_TABLET | Freq: Two times a day (BID) | ORAL | Status: DC

## 2013-04-15 MED ORDER — TRIAMCINOLONE ACETONIDE 0.5 % EX OINT: 1.0000 "application " | TOPICAL_OINTMENT | Freq: Two times a day (BID) | CUTANEOUS | Status: DC

## 2013-04-15 MED ORDER — CEFUROXIME AXETIL 500 MG PO TABS: 500.0000 mg | ORAL_TABLET | Freq: Two times a day (BID) | ORAL | Status: DC

## 2013-04-15 NOTE — Progress Notes (Signed)
   Subjective:    HPI  C/o L lower incisor was broke and swollen C/o sinus congestion F/u knee OA, DVT,HTN  Lost wt on diet   Review of Systems  Constitutional: Negative for appetite change, fatigue and unexpected weight change.  HENT: Positive for hearing loss (mild ). Negative for congestion, nosebleeds, sneezing, sore throat and trouble swallowing.   Eyes: Negative for itching and visual disturbance.  Respiratory: Negative for cough.   Cardiovascular: Negative for chest pain, palpitations and leg swelling.  Gastrointestinal: Negative for nausea, diarrhea, blood in stool and abdominal distention.  Genitourinary: Negative for frequency and hematuria.  Musculoskeletal: Negative for back pain, gait problem, joint swelling and neck pain.  Skin: Negative for rash.  Neurological: Negative for dizziness, tremors, speech difficulty and weakness.  Psychiatric/Behavioral: Negative for sleep disturbance, dysphoric mood and agitation. The patient is not nervous/anxious.    Wt Readings from Last 3 Encounters:  04/15/13 221 lb (100.245 kg)  10/28/12 216 lb 9.6 oz (98.249 kg)  10/09/12 216 lb 12.8 oz (98.34 kg)   BP Readings from Last 3 Encounters:  04/15/13 140/72  10/28/12 118/58  10/13/12 130/57        Objective:   Physical Exam  Constitutional: He is oriented to person, place, and time. He appears well-developed.  HENT:  Mouth/Throat: Oropharynx is clear and moist.  Eyes: Conjunctivae are normal. Pupils are equal, round, and reactive to light.  Neck: Normal range of motion. No JVD present. No thyromegaly present.  Cardiovascular: Normal rate, regular rhythm, normal heart sounds and intact distal pulses.  Exam reveals no gallop and no friction rub.   No murmur heard. Pulmonary/Chest: Effort normal and breath sounds normal. No respiratory distress. He has no wheezes. He has no rales. He exhibits no tenderness.  Abdominal: Soft. Bowel sounds are normal. He exhibits no distension  and no mass. There is no tenderness. There is no rebound and no guarding.  Musculoskeletal: Normal range of motion. He exhibits no edema and no tenderness.  Lymphadenopathy:    He has no cervical adenopathy.  Neurological: He is alert and oriented to person, place, and time. He has normal reflexes. No cranial nerve deficit. He exhibits normal muscle tone. Coordination normal.  Skin: Skin is warm and dry. No rash noted.  Psychiatric: He has a normal mood and affect. His behavior is normal. Judgment and thought content normal.  L upper molar - caries L lowe incisor -- caries   Lab Results  Component Value Date   WBC 8.9 10/09/2012   HGB 13.6 10/09/2012   HCT 42.3 10/09/2012   PLT 232 10/09/2012   GLUCOSE 93 10/09/2012   CHOL 137 04/29/2007   TRIG 79 04/29/2007   HDL 27.9* 04/29/2007   LDLCALC 93 04/29/2007   ALT 12 10/09/2012   AST 12 10/09/2012   NA 134* 10/09/2012   K 4.3 10/09/2012   CL 99 10/09/2012   CREATININE 1.06 10/09/2012   BUN 15 10/09/2012   CO2 29 10/09/2012   TSH 1.13 03/07/2011   PSA 1.70 04/29/2007   INR 1.02 10/09/2012        Assessment & Plan:

## 2013-04-15 NOTE — Assessment & Plan Note (Signed)
Start po abx

## 2013-04-15 NOTE — Progress Notes (Signed)
Pre visit review using our clinic review tool, if applicable. No additional management support is needed unless otherwise documented below in the visit note. 

## 2013-04-15 NOTE — Assessment & Plan Note (Signed)
Triamcinolone prn 

## 2013-04-15 NOTE — Assessment & Plan Note (Signed)
Continue with current prescription therapy as reflected on the Med list.  

## 2013-04-15 NOTE — Assessment & Plan Note (Signed)
Elevate LEs Compr socks

## 2013-04-22 DIAGNOSIS — N401 Enlarged prostate with lower urinary tract symptoms: Secondary | ICD-10-CM | POA: Diagnosis not present

## 2013-04-22 DIAGNOSIS — N138 Other obstructive and reflux uropathy: Secondary | ICD-10-CM | POA: Diagnosis not present

## 2013-04-22 DIAGNOSIS — N139 Obstructive and reflux uropathy, unspecified: Secondary | ICD-10-CM | POA: Diagnosis not present

## 2013-04-29 DIAGNOSIS — N401 Enlarged prostate with lower urinary tract symptoms: Secondary | ICD-10-CM | POA: Diagnosis not present

## 2013-04-29 DIAGNOSIS — R972 Elevated prostate specific antigen [PSA]: Secondary | ICD-10-CM | POA: Diagnosis not present

## 2013-04-29 DIAGNOSIS — N139 Obstructive and reflux uropathy, unspecified: Secondary | ICD-10-CM | POA: Diagnosis not present

## 2013-04-29 DIAGNOSIS — Q619 Cystic kidney disease, unspecified: Secondary | ICD-10-CM | POA: Diagnosis not present

## 2013-05-12 ENCOUNTER — Other Ambulatory Visit: Payer: Self-pay

## 2013-05-12 ENCOUNTER — Other Ambulatory Visit: Payer: Self-pay | Admitting: Internal Medicine

## 2013-05-12 MED ORDER — LOSARTAN POTASSIUM-HCTZ 100-25 MG PO TABS: ORAL_TABLET | ORAL | Status: DC

## 2013-05-15 ENCOUNTER — Other Ambulatory Visit: Payer: Self-pay | Admitting: Internal Medicine

## 2013-05-31 ENCOUNTER — Other Ambulatory Visit: Payer: Self-pay | Admitting: Internal Medicine

## 2013-06-11 DIAGNOSIS — N400 Enlarged prostate without lower urinary tract symptoms: Secondary | ICD-10-CM | POA: Diagnosis not present

## 2013-06-11 DIAGNOSIS — R972 Elevated prostate specific antigen [PSA]: Secondary | ICD-10-CM | POA: Diagnosis not present

## 2013-06-11 DIAGNOSIS — C61 Malignant neoplasm of prostate: Secondary | ICD-10-CM | POA: Diagnosis not present

## 2013-06-11 DIAGNOSIS — IMO0002 Reserved for concepts with insufficient information to code with codable children: Secondary | ICD-10-CM | POA: Diagnosis not present

## 2013-06-25 DIAGNOSIS — N401 Enlarged prostate with lower urinary tract symptoms: Secondary | ICD-10-CM | POA: Diagnosis not present

## 2013-06-25 DIAGNOSIS — N138 Other obstructive and reflux uropathy: Secondary | ICD-10-CM | POA: Diagnosis not present

## 2013-06-25 DIAGNOSIS — C61 Malignant neoplasm of prostate: Secondary | ICD-10-CM | POA: Diagnosis not present

## 2013-06-25 DIAGNOSIS — N139 Obstructive and reflux uropathy, unspecified: Secondary | ICD-10-CM | POA: Diagnosis not present

## 2013-07-02 ENCOUNTER — Encounter: Payer: Self-pay | Admitting: Radiation Oncology

## 2013-07-02 NOTE — Progress Notes (Signed)
GU Location of Tumor / Histology: prostatic adenocarcinoma  If Prostate Cancer, Gleason Score is (3 + 3) and PSA is (4.87)  Patient presented with large left renal cysts with pain that were aspirated in May 2012, BPH, and ED.  Biopsies of prostate (if applicable) revealed:     Past/Anticipated interventions by urology, if any: Follow up in 3 month with PSA; should patient decide to delay therapy he is to contact Wrenn's office to initial finasteride  Past/Anticipated interventions by medical oncology, if any: None  Weight changes, if any: None noted  Bowel/Bladder complaints, if any: denies hematuria, voiding well with terazosin   Nausea/Vomiting, if any: None noted  Pain issues, if any:  None noted  SAFETY ISSUES:  Prior radiation? NO  Pacemaker/ICD? NO  Possible current pregnancy? NO  Is the patient on methotrexate? NO  Current Complaints / other details:  73 year old male. Prostate volume 65 cc. Ax: contrast dye. Married. Charity fundraiser.

## 2013-07-06 ENCOUNTER — Encounter: Payer: Self-pay | Admitting: Radiation Oncology

## 2013-07-06 ENCOUNTER — Ambulatory Visit
Admission: RE | Admit: 2013-07-06 | Discharge: 2013-07-06 | Disposition: A | Discharge status: Home / Self Care | Payer: Medicare Other | Source: Ambulatory Visit | Attending: Radiation Oncology | Admitting: Radiation Oncology

## 2013-07-06 ENCOUNTER — Ambulatory Visit: Payer: Medicare Other | Admitting: Radiation Oncology

## 2013-07-06 ENCOUNTER — Ambulatory Visit: Payer: Medicare Other

## 2013-07-06 VITALS — BP 121/60 | HR 69 | Resp 16 | Ht 69.0 in | Wt 225.3 lb

## 2013-07-06 DIAGNOSIS — Z7982 Long term (current) use of aspirin: Secondary | ICD-10-CM | POA: Diagnosis not present

## 2013-07-06 DIAGNOSIS — C61 Malignant neoplasm of prostate: Secondary | ICD-10-CM | POA: Diagnosis not present

## 2013-07-06 DIAGNOSIS — I10 Essential (primary) hypertension: Secondary | ICD-10-CM | POA: Diagnosis not present

## 2013-07-06 DIAGNOSIS — N400 Enlarged prostate without lower urinary tract symptoms: Secondary | ICD-10-CM | POA: Insufficient documentation

## 2013-07-06 DIAGNOSIS — Z86718 Personal history of other venous thrombosis and embolism: Secondary | ICD-10-CM | POA: Diagnosis not present

## 2013-07-06 DIAGNOSIS — Z87891 Personal history of nicotine dependence: Secondary | ICD-10-CM | POA: Insufficient documentation

## 2013-07-06 DIAGNOSIS — Z510 Encounter for antineoplastic radiation therapy: Secondary | ICD-10-CM | POA: Insufficient documentation

## 2013-07-06 NOTE — Progress Notes (Signed)
Radiation Oncology         [phone]) [phone] ________________________________  Initial outpatient Consultation  Name: Dominic Shaffer MRN: [id number]  Date: 07/06/2013  DOB: [birth year]  CC:Walker Kehr, MD  Malka So, MD   REFERRING PHYSICIAN: Malka So, MD  DIAGNOSIS: 73 y.o. gentleman with stage T1c adenocarcinoma of the prostate with a Gleason's score of 3+3 and a PSA of 4.87  HISTORY OF PRESENT ILLNESS::Dominic Shaffer is a 73 y.o. gentleman followed by Dr. Jeffie Pollock in urology after aspiration of bilateral renal cysts in 2012.Marland Kitchen  He was noted to have a rising PSA up to 4.87.  He was seen in urology by Dr. Jeffie Pollock on 04/29/13,  digital rectal examination was performed at that time revealing a 2+ gland with no nodules.  The patient proceeded to transrectal ultrasound with 12 biopsies of the prostate on 06/11/13.  The prostate volume measured 65 cc.  Out of 12 core biopsies, 5 were positive.  The maximum Gleason score was 3+3, and this was seen in bilateral areas as displayed in the figure below:   The patient reviewed the biopsy results with his urologist and he has kindly been referred today for discussion of potential radiation treatment options.  PREVIOUS RADIATION THERAPY: No  PAST MEDICAL HISTORY:  has a past medical history of Achalasia; Hypertension; LBP (low back pain); BPH (benign prostatic hypertrophy); Internal hemorrhoid (2003); DVT of lower limb, acute (07/28/2010); Heart murmur; Left inguinal hernia; LBBB (left bundle branch block) (10/09/12); Prostate cancer; Coronary artery disease; Hematospermia; Depression; and GERD (gastroesophageal reflux disease).    PAST SURGICAL HISTORY: Past Surgical History  Procedure Laterality Date  . Surgery for achalasia    . Hernia repair      REPAIR OF UMBILICAL HERNIA  . Inguinal hernia repair Left 10/13/2012    Procedure: OPEN LEFT INGUINAL HERNIA REPAIR WITH ON Q PUMP;  Surgeon: Odis Hollingshead, MD;  Location: [facility];  Service:  General;  Laterality: Left;  . Insertion of mesh Left 10/13/2012    Procedure: INSERTION OF MESH;  Surgeon: Odis Hollingshead, MD;  Location: [facility];  Service: General;  Laterality: Left;    FAMILY HISTORY: family history includes Cancer (age of onset: 39) in his father; Heart disease (age of onset: 50) in his mother; Hypertension in his other.  SOCIAL HISTORY:  reports that he quit smoking about 48 years ago. His smoking use included Cigarettes. He has a .5 pack-year smoking history. He has never used smokeless tobacco. He reports that he does not drink alcohol or use illicit drugs.  ALLERGIES: Iodinated diagnostic agents and Shellfish allergy  MEDICATIONS:  Current Outpatient Prescriptions  Medication Sig Dispense Refill  . amLODipine (NORVASC) 5 MG tablet TAKE ONE TABLET BY MOUTH EVERY DAY  90 tablet  0  . aspirin 81 MG tablet Take 81 mg by mouth daily.      Marland Kitchen losartan-hydrochlorothiazide (HYZAAR) 100-25 MG per tablet TAKE ONE TABLET BY MOUTH ONCE DAILY  90 tablet  1  . terazosin (HYTRIN) 5 MG capsule TAKE ONE CAPSULE BY MOUTH AT BEDTIME  30 capsule  5  . Dietary Management Product (VASCULERA) TABS Take 1 tablet by mouth 2 (two) times daily.  60 tablet  11  . oxyCODONE (OXY IR/ROXICODONE) 5 MG immediate release tablet Take 1-2 tablets (5-10 mg total) by mouth every 4 (four) hours as needed for pain.  40 tablet  0  . triamcinolone ointment (KENALOG) 0.5 % Apply 1 application topically 2 (two) times  daily.  45 g  2   No current facility-administered medications for this encounter.    REVIEW OF SYSTEMS:  A 15 point review of systems is documented in the electronic medical record. This was obtained by the nursing staff. However, I reviewed this with the patient to discuss relevant findings and make appropriate changes.  A comprehensive review of systems was negative..  The patient completed an IPSS and IIEF questionnaire.  His IPSS score was 17 indicating moderate urinary outflow obstructive  symptoms.  He indicated that his erectile function is not typically able to complete sexual activity.   PHYSICAL EXAM: This patient is in no acute distress.  He is alert and oriented.   height is 5\' 9"  (1.753 m) and weight is 225 lb 4.8 oz (102.195 kg). His blood pressure is 121/60 and his pulse is 69. His respiration is 16.  He exhibits no respiratory distress or labored breathing.  He appears neurologically intact.  His mood is pleasant.  His affect is appropriate.  Please note the digital rectal exam findings described above.  KPS = 100  100 - Normal; no complaints; no evidence of disease. 90   - Able to carry on normal activity; minor signs or symptoms of disease. 80   - Normal activity with effort; some signs or symptoms of disease. 18   - Cares for self; unable to carry on normal activity or to do active work. 60   - Requires occasional assistance, but is able to care for most of his personal needs. 50   - Requires considerable assistance and frequent medical care. 64   - Disabled; requires special care and assistance. 64   - Severely disabled; hospital admission is indicated although death not imminent. 67   - Very sick; hospital admission necessary; active supportive treatment necessary. 10   - Moribund; fatal processes progressing rapidly. 0     - Dead  Karnofsky DA, Abelmann Lorton, Craver LS and Burchenal [facility] [phone]) The use of the nitrogen mustards in the palliative treatment of carcinoma: with particular reference to bronchogenic carcinoma Cancer 1 634-56   LABORATORY DATA:  Lab Results  Component Value Date   WBC 8.9 10/09/2012   HGB 13.6 10/09/2012   HCT 42.3 10/09/2012   MCV 81.3 10/09/2012   PLT 232 10/09/2012   Lab Results  Component Value Date   NA 134* 10/09/2012   K 4.3 10/09/2012   CL 99 10/09/2012   CO2 29 10/09/2012   Lab Results  Component Value Date   ALT 12 10/09/2012   AST 12 10/09/2012   ALKPHOS 87 10/09/2012   BILITOT 0.3 10/09/2012     RADIOGRAPHY: No results  found.    IMPRESSION: This gentleman is a 73 y.o. gentleman with stage T1c adenocarcinoma of the prostate with a Gleason's score of 3+3 and a PSA of 4.87.  His T-Stage, Gleason's Score, and PSA put him into the favorable risk group.  Accordingly he is eligible for a variety of potential treatment options including active surveillance, prostatectomy, or radiation in the form of seed implant versus IMRT.  Since his prostate is larger than 60 cc in size, if he is interested in seed implant, he may benefit from medical downsizing.  Given the fact that he only needs a 10% volume reduction, I suspect a 5 Alpha Reductase Inhibitor would be adequate rather than an LHRH analogue.  PLAN:Today I reviewed the findings and workup thus far.  We discussed the natural history of prostate cancer.  We reviewed the the implications of T-stage, Gleason's Score, and PSA on decision-making and outcomes in prostate cancer.  We discussed radiation treatment in the management of prostate cancer with regard to the logistics and delivery of external beam radiation treatment as well as the logistics and delivery of prostate brachytherapy.  We compared and contrasted each of these approaches and also compared these against prostatectomy.  The patient expressed interest in prostate brachytherapy.  I filled out a patient counseling form for him with relevant treatment diagrams and we retained a copy for our records.   The patient would like to proceed with prostate brachytherapy.  I will share my findings with Dr. Jeffie Pollock for consideration of 5ARI therapy and repeat transrectal ultrasound for volume study in 2-3 months.     I enjoyed meeting with him today, and will look forward to participating in the care of this very nice gentleman.   I spent 60 minutes face to face with the patient and more than 50% of that time was spent in counseling and/or coordination of care.   ------------------------------------------------  Sheral Apley.  Tammi Klippel, M.D.

## 2013-07-06 NOTE — Progress Notes (Signed)
Patient is allergic to contrast dye and shellfish. IPSS 17. Reports blood in stool and urine for one week following biopsy but, none since. Denies night sweats or recent weight loss. Denies bone pain but, does reports intermittent right flank pain. Reports a steady urine stream when voiding that at times is stronger than others. Reports nocturia x3. Denies difficult emptying his bladder. Denies headache, nausea and vomiting. Reports occasional dizziness upon standing. Vitals stable.

## 2013-07-06 NOTE — Progress Notes (Signed)
See progress note under physician encounter. 

## 2013-07-20 ENCOUNTER — Ambulatory Visit: Payer: Medicare Other

## 2013-07-20 ENCOUNTER — Ambulatory Visit: Payer: Medicare Other | Admitting: Radiation Oncology

## 2013-09-07 ENCOUNTER — Other Ambulatory Visit: Payer: Self-pay | Admitting: Internal Medicine

## 2013-10-08 DIAGNOSIS — C61 Malignant neoplasm of prostate: Secondary | ICD-10-CM | POA: Diagnosis not present

## 2013-10-19 DIAGNOSIS — C61 Malignant neoplasm of prostate: Secondary | ICD-10-CM | POA: Diagnosis not present

## 2013-10-26 DIAGNOSIS — N139 Obstructive and reflux uropathy, unspecified: Secondary | ICD-10-CM | POA: Diagnosis not present

## 2013-10-26 DIAGNOSIS — N401 Enlarged prostate with lower urinary tract symptoms: Secondary | ICD-10-CM | POA: Diagnosis not present

## 2013-10-26 DIAGNOSIS — C61 Malignant neoplasm of prostate: Secondary | ICD-10-CM | POA: Diagnosis not present

## 2013-10-26 DIAGNOSIS — N138 Other obstructive and reflux uropathy: Secondary | ICD-10-CM | POA: Diagnosis not present

## 2013-11-04 ENCOUNTER — Other Ambulatory Visit: Payer: Self-pay | Admitting: Radiation Oncology

## 2013-11-05 ENCOUNTER — Telehealth: Payer: Self-pay | Admitting: *Deleted

## 2013-11-05 NOTE — Telephone Encounter (Signed)
Called patient to ask question, spoke with patient. 

## 2013-11-06 ENCOUNTER — Other Ambulatory Visit: Payer: Self-pay | Admitting: Urology

## 2013-11-12 ENCOUNTER — Telehealth: Payer: Self-pay | Admitting: *Deleted

## 2013-11-12 NOTE — Telephone Encounter (Signed)
Called patient to remind of pre-seed appt. For 11-13-13 @ 9 am, spoke with patient's wife, Shauna Hugh and they are aware of this appt.

## 2013-11-13 ENCOUNTER — Other Ambulatory Visit: Payer: Self-pay

## 2013-11-13 ENCOUNTER — Encounter (HOSPITAL_BASED_OUTPATIENT_CLINIC_OR_DEPARTMENT_OTHER)
Admission: RE | Admit: 2013-11-13 | Discharge: 2013-11-13 | Disposition: A | Discharge status: Home / Self Care | Payer: Medicare Other | Source: Ambulatory Visit | Attending: Urology | Admitting: Urology

## 2013-11-13 ENCOUNTER — Ambulatory Visit (HOSPITAL_COMMUNITY)
Admission: RE | Admit: 2013-11-13 | Discharge: 2013-11-13 | Disposition: A | Discharge status: Home / Self Care | Payer: Medicare Other | Source: Ambulatory Visit | Attending: Urology | Admitting: Urology

## 2013-11-13 ENCOUNTER — Ambulatory Visit
Admission: RE | Admit: 2013-11-13 | Discharge: 2013-11-13 | Disposition: A | Discharge status: Home / Self Care | Payer: Medicare Other | Source: Ambulatory Visit | Attending: Radiation Oncology | Admitting: Radiation Oncology

## 2013-11-13 DIAGNOSIS — C61 Malignant neoplasm of prostate: Secondary | ICD-10-CM | POA: Diagnosis not present

## 2013-11-13 NOTE — Progress Notes (Signed)
  Radiation Oncology         [phone]) [phone] ________________________________  Name: Dominic Shaffer MRN: [id number]  Date: 11/13/2013  DOB: Jan 03, 1941     SIMULATION AND TREATMENT PLANNING NOTE PUBIC ARCH STUDY  NO:BSJG Plotnikov, MD  Malka So, MD  DIAGNOSIS: 73 y.o. gentleman with stage T1c adenocarcinoma of the prostate with a Gleason's score of 3+3 and a PSA of 4.87      ICD-9-CM ICD-10-CM  1. Malignant neoplasm of prostate 185 C61    COMPLEX SIMULATION:  The patient presented today for evaluation for possible prostate seed implant. He was brought to the radiation planning suite and placed supine on the CT couch. A 3-dimensional image study set was obtained in upload to the planning computer. There, on each axial slice, I contoured the prostate gland. Then, using three-dimensional radiation planning tools I reconstructed the prostate in view of the structures from the transperineal needle pathway to assess for possible pubic arch interference. In doing so, I did not appreciate any pubic arch interference. Also, the patient's prostate volume was estimated based on the drawn structure. The volume was 40.49 cc.  Given the pubic arch appearance and prostate volume, patient remains a good candidate to proceed with prostate seed implant. Today, he freely provided informed written consent to proceed.    PLAN: The patient will undergo prostate seed implant.   ________________________________  Sheral Apley. Tammi Klippel, M.D.

## 2013-12-18 DIAGNOSIS — Z01818 Encounter for other preprocedural examination: Secondary | ICD-10-CM | POA: Diagnosis not present

## 2013-12-18 DIAGNOSIS — C61 Malignant neoplasm of prostate: Secondary | ICD-10-CM | POA: Diagnosis not present

## 2013-12-22 ENCOUNTER — Encounter (HOSPITAL_BASED_OUTPATIENT_CLINIC_OR_DEPARTMENT_OTHER): Payer: Self-pay | Admitting: *Deleted

## 2013-12-23 ENCOUNTER — Encounter (HOSPITAL_BASED_OUTPATIENT_CLINIC_OR_DEPARTMENT_OTHER): Payer: Self-pay | Admitting: *Deleted

## 2013-12-23 NOTE — Progress Notes (Signed)
NPO AFTER MN. ARRIVE AT 0630. PT GETTING LAB WORK DONE TODAY BUT UNABLE TO MAKE IT WILL COME ON Monday 12-28-2013. CURRENT EKG AND CXR IN CHART AND EPIC. WILL TAKE NORVASC AM DOS W/ SIPS OF WATER AND DO FLEET ENEMA.

## 2013-12-23 NOTE — Progress Notes (Signed)
   12/23/13 1012  OBSTRUCTIVE SLEEP APNEA  Have you ever been diagnosed with sleep apnea through a sleep study? No  Do you snore loudly (loud enough to be heard through closed doors)?  0  Do you often feel tired, fatigued, or sleepy during the daytime? 0  Has anyone observed you stop breathing during your sleep? 0  Do you have, or are you being treated for high blood pressure? 1  BMI more than 35 kg/m2? 0  Age over 71 years old? 1  Neck circumference greater than 40 cm/16 inches? 1  Gender: 1  Obstructive Sleep Apnea Score 4  Score 4 or greater  Results sent to PCP

## 2013-12-28 DIAGNOSIS — F329 Major depressive disorder, single episode, unspecified: Secondary | ICD-10-CM | POA: Diagnosis not present

## 2013-12-28 DIAGNOSIS — I739 Peripheral vascular disease, unspecified: Secondary | ICD-10-CM | POA: Diagnosis not present

## 2013-12-28 DIAGNOSIS — Z91013 Allergy to seafood: Secondary | ICD-10-CM | POA: Diagnosis not present

## 2013-12-28 DIAGNOSIS — I10 Essential (primary) hypertension: Secondary | ICD-10-CM | POA: Diagnosis not present

## 2013-12-28 DIAGNOSIS — Z91041 Radiographic dye allergy status: Secondary | ICD-10-CM | POA: Diagnosis not present

## 2013-12-28 DIAGNOSIS — R361 Hematospermia: Secondary | ICD-10-CM | POA: Diagnosis not present

## 2013-12-28 DIAGNOSIS — Z87891 Personal history of nicotine dependence: Secondary | ICD-10-CM | POA: Diagnosis not present

## 2013-12-28 DIAGNOSIS — K219 Gastro-esophageal reflux disease without esophagitis: Secondary | ICD-10-CM | POA: Diagnosis not present

## 2013-12-28 DIAGNOSIS — I2510 Atherosclerotic heart disease of native coronary artery without angina pectoris: Secondary | ICD-10-CM | POA: Diagnosis not present

## 2013-12-28 DIAGNOSIS — C61 Malignant neoplasm of prostate: Secondary | ICD-10-CM | POA: Diagnosis not present

## 2013-12-28 DIAGNOSIS — R609 Edema, unspecified: Secondary | ICD-10-CM | POA: Diagnosis not present

## 2013-12-28 DIAGNOSIS — Z79899 Other long term (current) drug therapy: Secondary | ICD-10-CM | POA: Diagnosis not present

## 2013-12-28 LAB — COMPREHENSIVE METABOLIC PANEL
ALBUMIN: 3.2 g/dL — AB (ref 3.5–5.2)
ALK PHOS: 85 U/L (ref 39–117)
ALT: 12 U/L (ref 0–53)
AST: 12 U/L (ref 0–37)
Anion gap: 13 (ref 5–15)
BUN: 16 mg/dL (ref 6–23)
CALCIUM: 9 mg/dL (ref 8.4–10.5)
CO2: 23 mEq/L (ref 19–32)
Chloride: 103 mEq/L (ref 96–112)
Creatinine, Ser: 1.16 mg/dL (ref 0.50–1.35)
GFR calc Af Amer: 71 mL/min — ABNORMAL LOW (ref >90)
GFR calc non Af Amer: 62 mL/min — ABNORMAL LOW (ref >90)
Glucose, Bld: 130 mg/dL — ABNORMAL HIGH (ref 70–99)
POTASSIUM: 4.3 meq/L (ref 3.7–5.3)
SODIUM: 139 meq/L (ref 137–147)
TOTAL PROTEIN: 7.6 g/dL (ref 6.0–8.3)
Total Bilirubin: 0.4 mg/dL (ref 0.3–1.2)

## 2013-12-28 LAB — PROTIME-INR
INR: 1.02 (ref 0.00–1.49)
Prothrombin Time: 13.5 seconds (ref 11.6–15.2)

## 2013-12-28 LAB — CBC
HCT: 40.4 % (ref 39.0–52.0)
Hemoglobin: 12.9 g/dL — ABNORMAL LOW (ref 13.0–17.0)
MCH: 25.7 pg — AB (ref 26.0–34.0)
MCHC: 31.9 g/dL (ref 30.0–36.0)
MCV: 80.5 fL (ref 78.0–100.0)
PLATELETS: 216 10*3/uL (ref 150–400)
RBC: 5.02 MIL/uL (ref 4.22–5.81)
RDW: 13.6 % (ref 11.5–15.5)
WBC: 9.8 10*3/uL (ref 4.0–10.5)

## 2013-12-28 LAB — APTT: APTT: 35 s (ref 24–37)

## 2013-12-30 ENCOUNTER — Telehealth: Payer: Self-pay | Admitting: *Deleted

## 2013-12-30 DIAGNOSIS — C61 Malignant neoplasm of prostate: Secondary | ICD-10-CM | POA: Diagnosis not present

## 2013-12-30 NOTE — H&P (Signed)
Reason For Visit Seen today for a pre-op visit.   Active Problems Problems  1. Preop examination (V78.588)   Assessed By: Jimmey Ralph (Urology); Last Assessed: 18 Dec 2013 2. Prostate cancer (C61)   Assessed By: Jimmey Ralph (Urology); Last Assessed: 18 Dec 2013  History of Present Illness 73 YO male patient of Dr. Ralene Muskrat seen today for a pre-op visit. Scheduled for I-125 seed implant 12/31/13.     GU Hx:   T1c Gleason 6 prostate cancer and is being downsized for seeds with finasteride. The prostate volume Sept 2015 is down to 55ml from 70ml.     Nov 2015 Interval Hx:   Today denies f/c, n/v, cough, or SOB. Does have mild nasal congestion.   Past Medical History Problems  1. History of Coronary Artery Disease 2. History of Hematospermia (R36.1) 3. History of depression (Z86.59) 4. History of edema (Z87.898) 5. History of esophageal reflux (Z87.19) 6. History of Localized Soft Tissue Swelling Entire Right Lower Extremity 7. History of Urine Is Dark But Not Cloudy 8. History of Venous Thrombosis Of The Deep Vessels Of The Lower Extremity  Surgical History Problems  1. History of Esophageal Dilation 2. History of Esophageal Surgery Heller Myotomy 3. History of Hernia Repair 4. History of Umbilical Hernia Repair  Current Meds 1. AmLODIPine Besylate 5 MG Oral Tablet;  Therapy: 17Apr2015 to Recorded 2. Finasteride 5 MG Oral Tablet; TAKE 1 TABLET DAILY AS DIRECTED;  Therapy: 50YDX4128 to (Evaluate:04Jun2016)  Requested for: [id number]; Last  Rx:10Jun2015 Ordered 3. Ibuprofen 400 MG Oral Tablet; AS NEEDED;  Therapy: 47SJG2836 to Recorded 4. Levitra 20 MG Oral Tablet; Take one tablet prn as directed;  Therapy: 12Jul2010 to (Last Rx:29Aug2012)  Requested for: 29Aug2012 Ordered 5. Losartan Potassium-HCTZ 100-25 MG Oral Tablet;  Therapy: 14Apr2015 to Recorded 6. Terazosin HCl - 5 MG Oral Capsule;  Therapy: [phone] to Recorded 7. Triamcinolone Acetonide 0.5 %  External Ointment;  Therapy: 50PTW6568 to Recorded  Allergies Medication  1. No Known Drug Allergies Non-Medication  2. Contrast Dye 3. Shellfish  Family History Problems  1. Family history of Cardiac Failure : Mother 2. Family history of Death In The Family Father : Father   HeartAge 97 3. Family history of Death In The Family Mother : Mother   Heart Failureage 75 4. Family history of Family Health Status Number Of Children   3 sons 55. Family history of Renal Failure : Mother  Social History Problems  1. Denied: History of Alcohol Use 2. Denied: History of Caffeine Use 3. Marital History - Currently Married 4. Never A Smoker 5. Occupation:   Charity fundraiser 6. Denied: History of Tobacco Use  Review of Systems Genitourinary, constitutional, skin, eye, otolaryngeal, hematologic/lymphatic, cardiovascular, pulmonary, endocrine, musculoskeletal, gastrointestinal, neurological and psychiatric system(s) were reviewed and pertinent findings if present are noted and are otherwise negative.  ENT: nasal passage blockage.    Vitals Vital Signs [Data Includes: Last 1 Day]  Recorded: 12XNT7001 03:26PM  Height: 5 ft 10 in Weight: 220 lb  BMI Calculated: 31.57 BSA Calculated: 2.17 Blood Pressure: 116 / 69, Sitting Temperature: 98.2 F, Oral Heart Rate: 67 Respiration: 16  Physical Exam Constitutional: Well nourished and well developed . No acute distress. The patient appears well hydrated.  ENT:. The ears and nose are normal in appearance.  Neck: The appearance of the neck is normal.  Pulmonary: No respiratory distress.  Cardiovascular: Heart rate and rhythm are normal.  Abdomen: The abdomen is mildly obese. The abdomen is soft  and nontender.  Skin: Normal skin turgor.  Neuro/Psych:. Mood and affect are appropriate.    Results/Data Urine [Data Includes: Last 1 Day]   [id number]  COLOR YELLOW   APPEARANCE CLEAR   SPECIFIC GRAVITY 1.025   pH 6.0   GLUCOSE NEG  mg/dL  BILIRUBIN NEG   KETONE TRACE mg/dL  BLOOD NEG   PROTEIN NEG mg/dL  UROBILINOGEN 0.2 mg/dL  NITRITE NEG   LEUKOCYTE ESTERASE NEG    The following clinical lab reports were reviewed:  UA- negative.    Assessment Assessed  1. Prostate cancer (C61) 2. Preop examination (Z01.818)  Plan Health Maintenance  1. UA With REFLEX; [Do Not Release]; Status:Complete;   Done: [id number] 03:19PM  At this time cleared to proceed with surgical intervention as scheduled   Discussion/Summary CC: Dr. Ledon Snare.

## 2013-12-30 NOTE — Telephone Encounter (Signed)
Called patient to remind of procedure for 12-31-13, spoke with patient and he is aware of this procedure.

## 2013-12-31 ENCOUNTER — Encounter (HOSPITAL_BASED_OUTPATIENT_CLINIC_OR_DEPARTMENT_OTHER)
Admission: RE | Disposition: A | Discharge status: Home / Self Care | Payer: Self-pay | Source: Ambulatory Visit | Attending: Urology

## 2013-12-31 ENCOUNTER — Ambulatory Visit (HOSPITAL_BASED_OUTPATIENT_CLINIC_OR_DEPARTMENT_OTHER): Payer: Medicare Other | Admitting: Anesthesiology

## 2013-12-31 ENCOUNTER — Encounter (HOSPITAL_BASED_OUTPATIENT_CLINIC_OR_DEPARTMENT_OTHER): Payer: Self-pay

## 2013-12-31 ENCOUNTER — Ambulatory Visit (HOSPITAL_COMMUNITY): Payer: Medicare Other

## 2013-12-31 ENCOUNTER — Ambulatory Visit (HOSPITAL_BASED_OUTPATIENT_CLINIC_OR_DEPARTMENT_OTHER)
Admission: RE | Admit: 2013-12-31 | Discharge: 2013-12-31 | Disposition: A | Discharge status: Home / Self Care | Payer: Medicare Other | Source: Ambulatory Visit | Attending: Urology | Admitting: Urology

## 2013-12-31 ENCOUNTER — Telehealth: Payer: Self-pay | Admitting: Internal Medicine

## 2013-12-31 DIAGNOSIS — I739 Peripheral vascular disease, unspecified: Secondary | ICD-10-CM | POA: Insufficient documentation

## 2013-12-31 DIAGNOSIS — Z79899 Other long term (current) drug therapy: Secondary | ICD-10-CM | POA: Insufficient documentation

## 2013-12-31 DIAGNOSIS — Z87891 Personal history of nicotine dependence: Secondary | ICD-10-CM | POA: Insufficient documentation

## 2013-12-31 DIAGNOSIS — C61 Malignant neoplasm of prostate: Secondary | ICD-10-CM | POA: Insufficient documentation

## 2013-12-31 DIAGNOSIS — I2510 Atherosclerotic heart disease of native coronary artery without angina pectoris: Secondary | ICD-10-CM | POA: Diagnosis not present

## 2013-12-31 DIAGNOSIS — Z91013 Allergy to seafood: Secondary | ICD-10-CM | POA: Insufficient documentation

## 2013-12-31 DIAGNOSIS — Z91041 Radiographic dye allergy status: Secondary | ICD-10-CM | POA: Insufficient documentation

## 2013-12-31 DIAGNOSIS — I10 Essential (primary) hypertension: Secondary | ICD-10-CM | POA: Insufficient documentation

## 2013-12-31 DIAGNOSIS — F329 Major depressive disorder, single episode, unspecified: Secondary | ICD-10-CM | POA: Insufficient documentation

## 2013-12-31 DIAGNOSIS — K219 Gastro-esophageal reflux disease without esophagitis: Secondary | ICD-10-CM | POA: Insufficient documentation

## 2013-12-31 DIAGNOSIS — R361 Hematospermia: Secondary | ICD-10-CM | POA: Insufficient documentation

## 2013-12-31 DIAGNOSIS — R609 Edema, unspecified: Secondary | ICD-10-CM | POA: Insufficient documentation

## 2013-12-31 HISTORY — DX: Benign prostatic hyperplasia with lower urinary tract symptoms: N13.8

## 2013-12-31 HISTORY — PX: RADIOACTIVE SEED IMPLANT: SHX5150

## 2013-12-31 HISTORY — DX: Congenital single renal cyst: Q61.01

## 2013-12-31 HISTORY — DX: Male erectile dysfunction, unspecified: N52.9

## 2013-12-31 HISTORY — DX: Presence of spectacles and contact lenses: Z97.3

## 2013-12-31 HISTORY — DX: Other specified postprocedural states: Z98.890

## 2013-12-31 HISTORY — DX: Other specified personal risk factors, not elsewhere classified: Z91.89

## 2013-12-31 HISTORY — DX: Benign prostatic hyperplasia with lower urinary tract symptoms: N40.1

## 2013-12-31 HISTORY — DX: Personal history of other venous thrombosis and embolism: Z86.718

## 2013-12-31 HISTORY — DX: Occlusion and stenosis of left carotid artery: I65.22

## 2013-12-31 HISTORY — DX: Nocturia: R35.1

## 2013-12-31 HISTORY — DX: Achalasia of cardia: K22.0

## 2013-12-31 SURGERY — INSERTION, RADIATION SOURCE, PROSTATE
Anesthesia: General | Site: Prostate

## 2013-12-31 MED ORDER — PHENYLEPHRINE HCL 10 MG/ML IJ SOLN
10.0000 mg | INTRAVENOUS | Status: DC | PRN
  Administered 2013-12-31: 50 ug/min via INTRAVENOUS

## 2013-12-31 MED ORDER — CIPROFLOXACIN IN D5W 400 MG/200ML IV SOLN
INTRAVENOUS | Status: AC
  Filled 2013-12-31: qty 200

## 2013-12-31 MED ORDER — PROPOFOL 10 MG/ML IV BOLUS
INTRAVENOUS | Status: DC | PRN
  Administered 2013-12-31: 50 mg via INTRAVENOUS
  Administered 2013-12-31: 200 mg via INTRAVENOUS
  Administered 2013-12-31: 20 mg via INTRAVENOUS
  Administered 2013-12-31: 10 mg via INTRAVENOUS

## 2013-12-31 MED ORDER — LIDOCAINE HCL (CARDIAC) 20 MG/ML IV SOLN
INTRAVENOUS | Status: DC | PRN
  Administered 2013-12-31: 50 mg via INTRAVENOUS

## 2013-12-31 MED ORDER — OXYCODONE HCL 5 MG PO TABS
ORAL_TABLET | ORAL | Status: AC
  Filled 2013-12-31: qty 1

## 2013-12-31 MED ORDER — OXYCODONE HCL 5 MG PO TABS
5.0000 mg | ORAL_TABLET | ORAL | Status: DC | PRN
  Administered 2013-12-31: 5 mg via ORAL
  Filled 2013-12-31: qty 2

## 2013-12-31 MED ORDER — LACTATED RINGERS IV SOLN
INTRAVENOUS | Status: DC
  Administered 2013-12-31 (×2): via INTRAVENOUS
  Filled 2013-12-31: qty 1000

## 2013-12-31 MED ORDER — KETOROLAC TROMETHAMINE 30 MG/ML IJ SOLN
15.0000 mg | Freq: Once | INTRAMUSCULAR | Status: DC | PRN
  Filled 2013-12-31: qty 1

## 2013-12-31 MED ORDER — CIPROFLOXACIN HCL 500 MG PO TABS: 500.0000 mg | ORAL_TABLET | Freq: Two times a day (BID) | ORAL | Status: DC

## 2013-12-31 MED ORDER — SUCCINYLCHOLINE CHLORIDE 20 MG/ML IJ SOLN
INTRAMUSCULAR | Status: DC | PRN
  Administered 2013-12-31: 100 mg via INTRAVENOUS

## 2013-12-31 MED ORDER — ACETAMINOPHEN 325 MG PO TABS
650.0000 mg | ORAL_TABLET | ORAL | Status: DC | PRN
  Filled 2013-12-31: qty 2

## 2013-12-31 MED ORDER — STERILE WATER FOR IRRIGATION IR SOLN
Status: DC | PRN
  Administered 2013-12-31: 3 mL

## 2013-12-31 MED ORDER — PROMETHAZINE HCL 25 MG/ML IJ SOLN
6.2500 mg | INTRAMUSCULAR | Status: DC | PRN
  Filled 2013-12-31: qty 1

## 2013-12-31 MED ORDER — MIDAZOLAM HCL 2 MG/2ML IJ SOLN
INTRAMUSCULAR | Status: AC
  Filled 2013-12-31: qty 2

## 2013-12-31 MED ORDER — SODIUM CHLORIDE 0.9 % IJ SOLN
3.0000 mL | Freq: Two times a day (BID) | INTRAMUSCULAR | Status: DC
  Filled 2013-12-31: qty 3

## 2013-12-31 MED ORDER — ACETAMINOPHEN 650 MG RE SUPP
650.0000 mg | RECTAL | Status: DC | PRN
  Filled 2013-12-31: qty 1

## 2013-12-31 MED ORDER — DEXAMETHASONE SODIUM PHOSPHATE 4 MG/ML IJ SOLN
INTRAMUSCULAR | Status: DC | PRN
  Administered 2013-12-31: 8 mg via INTRAVENOUS

## 2013-12-31 MED ORDER — ACETAMINOPHEN 10 MG/ML IV SOLN
1000.0000 mg | Freq: Once | INTRAVENOUS | Status: AC
  Administered 2013-12-31: 1000 mg via INTRAVENOUS
  Filled 2013-12-31: qty 100

## 2013-12-31 MED ORDER — FENTANYL CITRATE 0.05 MG/ML IJ SOLN
INTRAMUSCULAR | Status: AC
  Filled 2013-12-31: qty 6

## 2013-12-31 MED ORDER — CEFAZOLIN SODIUM-DEXTROSE 2-3 GM-% IV SOLR
INTRAVENOUS | Status: AC
  Filled 2013-12-31: qty 50

## 2013-12-31 MED ORDER — HYDROCODONE-ACETAMINOPHEN 5-325 MG PO TABS: 1.0000 | ORAL_TABLET | Freq: Four times a day (QID) | ORAL | Status: DC | PRN

## 2013-12-31 MED ORDER — CIPROFLOXACIN IN D5W 400 MG/200ML IV SOLN
400.0000 mg | INTRAVENOUS | Status: AC
  Administered 2013-12-31: 400 mg via INTRAVENOUS
  Filled 2013-12-31: qty 200

## 2013-12-31 MED ORDER — SODIUM CHLORIDE 0.9 % IV SOLN
250.0000 mL | INTRAVENOUS | Status: DC | PRN
  Filled 2013-12-31: qty 250

## 2013-12-31 MED ORDER — MIDAZOLAM HCL 5 MG/5ML IJ SOLN
INTRAMUSCULAR | Status: DC | PRN
  Administered 2013-12-31: 2 mg via INTRAVENOUS

## 2013-12-31 MED ORDER — IOHEXOL 350 MG/ML SOLN
INTRAVENOUS | Status: DC | PRN
  Administered 2013-12-31: 7 mL

## 2013-12-31 MED ORDER — ONDANSETRON HCL 4 MG/2ML IJ SOLN
INTRAMUSCULAR | Status: DC | PRN
  Administered 2013-12-31: 4 mg via INTRAVENOUS

## 2013-12-31 MED ORDER — SODIUM CHLORIDE 0.9 % IJ SOLN
3.0000 mL | INTRAMUSCULAR | Status: DC | PRN
  Filled 2013-12-31: qty 3

## 2013-12-31 MED ORDER — FENTANYL CITRATE 0.05 MG/ML IJ SOLN
25.0000 ug | INTRAMUSCULAR | Status: DC | PRN
  Filled 2013-12-31: qty 1

## 2013-12-31 MED ORDER — FENTANYL CITRATE 0.05 MG/ML IJ SOLN
INTRAMUSCULAR | Status: DC | PRN
  Administered 2013-12-31 (×4): 25 ug via INTRAVENOUS
  Administered 2013-12-31: 50 ug via INTRAVENOUS

## 2013-12-31 MED ORDER — FLEET ENEMA 7-19 GM/118ML RE ENEM
1.0000 | ENEMA | Freq: Once | RECTAL | Status: DC
  Filled 2013-12-31: qty 1

## 2013-12-31 SURGICAL SUPPLY — 26 items
BAG URINE DRAINAGE (UROLOGICAL SUPPLIES) ×2 IMPLANT
BLADE CLIPPER SURG (BLADE) ×2 IMPLANT
CATH FOLEY 2WAY SLVR  5CC 16FR (CATHETERS) ×1
CATH FOLEY 2WAY SLVR 5CC 16FR (CATHETERS) ×1 IMPLANT
CATH ROBINSON RED A/P 20FR (CATHETERS) ×2 IMPLANT
CLOTH BEACON ORANGE TIMEOUT ST (SAFETY) ×2 IMPLANT
COVER MAYO STAND STRL (DRAPES) ×2 IMPLANT
COVER TABLE BACK 60X90 (DRAPES) ×2 IMPLANT
DRSG TEGADERM 4X4.75 (GAUZE/BANDAGES/DRESSINGS) ×4 IMPLANT
DRSG TEGADERM 8X12 (GAUZE/BANDAGES/DRESSINGS) ×4 IMPLANT
GLOVE BIO SURGEON STRL SZ7.5 (GLOVE) IMPLANT
GLOVE ECLIPSE 8.0 STRL XLNG CF (GLOVE) ×12 IMPLANT
GLOVE SURG SS PI 7.5 STRL IVOR (GLOVE) ×4 IMPLANT
GLOVE SURG SS PI 8.0 STRL IVOR (GLOVE) ×4 IMPLANT
GOWN PREVENTION PLUS LG XLONG (DISPOSABLE) IMPLANT
GOWN STRL REIN XL XLG (GOWN DISPOSABLE) IMPLANT
GOWN STRL REUS W/TWL LRG LVL3 (GOWN DISPOSABLE) ×2 IMPLANT
GOWN STRL REUS W/TWL XL LVL3 (GOWN DISPOSABLE) ×2 IMPLANT
HOLDER FOLEY CATH W/STRAP (MISCELLANEOUS) ×2 IMPLANT
NUCLETRON SELECTSEED ×2 IMPLANT
PACK CYSTO (CUSTOM PROCEDURE TRAY) ×2 IMPLANT
SPONGE GAUZE 4X4 12PLY STER LF (GAUZE/BANDAGES/DRESSINGS) ×2 IMPLANT
SYRINGE 10CC LL (SYRINGE) ×2 IMPLANT
UNDERPAD 30X30 INCONTINENT (UNDERPADS AND DIAPERS) ×4 IMPLANT
WATER STERILE IRR 3000ML UROMA (IV SOLUTION) ×2 IMPLANT
WATER STERILE IRR 500ML POUR (IV SOLUTION) ×2 IMPLANT

## 2013-12-31 NOTE — Anesthesia Procedure Notes (Addendum)
Procedure Name: LMA Insertion Date/Time: 12/31/2013 8:17 AM Performed by: Denna Haggard D Pre-anesthesia Checklist: Patient identified, Emergency Drugs available, Suction available and Patient being monitored Patient Re-evaluated:Patient Re-evaluated prior to inductionOxygen Delivery Method: Circle System Utilized Preoxygenation: Pre-oxygenation with 100% oxygen Intubation Type: IV induction Ventilation: Mask ventilation without difficulty LMA: LMA inserted and LMA with gastric port inserted LMA Size: 5.0 Number of attempts: 2 Airway Equipment and Method: bite block Placement Confirmation: positive ETCO2 Tube secured with: Tape Dental Injury: Teeth and Oropharynx as per pre-operative assessment  Comments: LMA #5 inserted unable to get a good seal.  #5 LMA Supreme inserted and unable to get good seal.  Converting to GETA.   Procedure Name: Intubation Date/Time: 12/31/2013 8:22 AM Performed by: Denna Haggard D Pre-anesthesia Checklist: Patient identified, Emergency Drugs available, Suction available and Patient being monitored Patient Re-evaluated:Patient Re-evaluated prior to inductionOxygen Delivery Method: Circle System Utilized Preoxygenation: Pre-oxygenation with 100% oxygen Intubation Type: IV induction Ventilation: Mask ventilation without difficulty Laryngoscope Size: Mac and 4 Grade View: Grade I Tube type: Oral Tube size: 8.0 mm Number of attempts: 1 Airway Equipment and Method: stylet and oral airway Placement Confirmation: ETT inserted through vocal cords under direct vision,  positive ETCO2 and breath sounds checked- equal and bilateral Secured at: 23 cm Tube secured with: Tape Dental Injury: Teeth and Oropharynx as per pre-operative assessment

## 2013-12-31 NOTE — Anesthesia Postprocedure Evaluation (Signed)
  Anesthesia Post-op Note  Patient: Dominic Shaffer  Procedure(s) Performed: Procedure(s) (LRB): RADIOACTIVE SEED IMPLANT (N/A)  Patient Location: PACU  Anesthesia Type: General  Level of Consciousness: awake and alert   Airway and Oxygen Therapy: Patient Spontanous Breathing  Post-op Pain: mild  Post-op Assessment: Post-op Vital signs reviewed, Patient's Cardiovascular Status Stable, Respiratory Function Stable, Patent Airway and No signs of Nausea or vomiting  Last Vitals:  Filed Vitals:   12/31/13 1045  BP: 109/56  Pulse: 80  Temp:   Resp: 17    Post-op Vital Signs: stable   Complications: No apparent anesthesia complications

## 2013-12-31 NOTE — Interval H&P Note (Signed)
History and Physical Interval Note:  12/31/2013 7:55 AM  Dominic Shaffer  has presented today for surgery, with the diagnosis of prostate cancer  The various methods of treatment have been discussed with the patient and family. After consideration of risks, benefits and other options for treatment, the patient has consented to  Procedure(s) with comments: RADIOACTIVE SEED IMPLANT (N/A) - C-arm & DR portable as a surgical intervention .  The patient's history has been reviewed, patient examined, no change in status, stable for surgery.  I have reviewed the patient's chart and labs.  Questions were answered to the patient's satisfaction.     Ott Zimmerle J

## 2013-12-31 NOTE — Discharge Instructions (Addendum)
Brachytherapy for Prostate Cancer Brachytherapy for prostate cancer is radiation treatment given from the inside of the prostate instead of from the outside by an external beam. There are two types of brachytherapy:  Low-dose-rate therapy. This involves seed or pellet implantation.  High-dose-rate therapy. This is given by thin tubes that contain radioactive material. When the seed method (also called implant therapy) is used, 80 to 120 little pellets are placed into the prostate and left in place. Because the radiation does not travel far from the prostate, healthy, noncancerous tissues around the prostate receive only a small dose of radiation, which helps protect them from injury. The radiation fades away over the next few months. If the high-dose therapy is used, the thin tubes of radiation are removed after the treatment and there is no radiation left in the prostate when you leave the hospital. This type of treatment is followed by a course of radiation by an external beam method on an outpatient basis. LET [facility] CARE PROVIDER KNOW ABOUT:   Any allergies you have.  All medicines you are taking, including blood thinners, vitamins, herbs, eye drops, creams, and over-the-counter medicines.  Previous problems you or members of your family have had with the use of anesthetics.  Any blood disorders you have had.  Previous surgeries you have had.  Medical conditions you have. RISKS AND COMPLICATIONS  Generally, brachytherapy for prostate cancer is a safe procedure. However, problems can occur.  Possible short-term problems include:  Difficulty passing urine. You may need a catheter for a few days to a month.  Blood in the urine or semen.  A feeling of constipation because of prostate swelling.  Frequent feeling of an urgent need to urinate. Possible long-term problems include:  Inflammation of the rectum. This happens in about 2% of people who have the procedure.  Erection  problems. These vary with age and occur in about 15-40% of men.  Difficulty urinating. This is caused by scarring in the urethra.  Diarrhea. BEFORE THE PROCEDURE  You will be scheduled for some tests, which may include an ultrasound, CT scan, or MRI. These tests do not hurt. A specialist in radiation treatment will meet with you and will decide which type of brachytherapy to use based on these imaging tests. PROCEDURE   You will be given a medicine that makes you fall asleep (general anesthetic).  A small probe will be placed in the rectum.  Ultrasound waves will be used to guide the insertion of small tubes into the prostate in preplanned locations.  If the seed method (low-dose) is used:  Small pellets the size of a rice grain will be placed into the tube.  The tube will then be removed, leaving the seeds in the prostate.  If the high-dose method is used:  Radioactive wires will be inserted and left in until a specific dose of radiation has been given.  The wires will then be removed.  In both methods, a catheter is usually placed into the bladder. AFTER THE PROCEDURE  Follow-up depends on the type of treatment given. With the high-dose treatment, there is no radiation risk after the treatment. Usually additional treatment with external beam radiation will be provided. This will require a series of visits back to the clinic as an outpatient.  With the seed implant, some physicians recommend that close contact with children and pregnant women be limited because of the low dose of radiation still in the prostate. That radiation will be almost gone by 2  months, and by 4-6 months the levels will be almost undetectable. Document Released: 06/25/2005 Document Revised: 06/01/2013 Document Reviewed: 07/08/2012 [facility] Patient Information 2015 [place], [place]. This information is not intended to replace advice given to you by your health care provider. Make sure you discuss any questions  you have with your health care provider.  Brachytherapy for Prostate Cancer, Care After Refer to this sheet in the next few weeks. These instructions provide you with information on caring for yourself after your procedure. Your health care provider may also give you more specific instructions. Your treatment has been planned according to current medical practices, but problems sometimes occur. Call your health care provider if you have any problems or questions after your procedure. WHAT TO EXPECT AFTER THE PROCEDURE The area behind the scrotum will probably be tender and bruised. For a short period of time you may have:  Difficulty passing urine. You may need a catheter for a few days to a month.  Blood in the urine or semen.  A feeling of constipation because of prostate swelling.  Frequent feeling of an urgent need to urinate. For a long period of time you may have:  Inflammation of the rectum. This happens in about 2% of people who have the procedure.  Erection problems. These vary with age and occur in about 15-40% of men.  Difficulty urinating. This is caused by scarring in the urethra.  Diarrhea. HOME CARE INSTRUCTIONS   Take medicines only as directed by your health care provider.  You will probably have a catheter in your bladder for several days. You will have blood in the urine bag and should drink a lot of fluids to keep it a light red color.  Keep all follow-up visits as directed by your health care provider. If you have a catheter, it will be removed during one of these visits.  Try not to sit directly on the area behind the scrotum. A soft cushion can decrease the discomfort. Ice packs may also be helpful for the discomfort. Do not put ice directly on the skin.  Shower and wash the area behind the scrotum gently. Do not sit in a tub.  If you have had the brachytherapy that uses the seeds, limit your close contact with children and pregnant women for 2 months because  of the radiation still in the prostate. After that period of time, the levels drop off quickly. SEEK IMMEDIATE MEDICAL CARE IF:   You have a fever.  You have chills.  You have shortness of breath.  You have chest pain.  You have thick blood, like tomato juice, in the urine bag.  Your catheter is blocked so urine cannot get into the bag. Your bladder area or lower abdomen may be swollen.  There is excessive bleeding from your rectum. It is normal to have a little blood mixed with your stool.  There is severe discomfort in the treated area that does not go away with pain medicine.  You have abdominal discomfort.  You have severe nausea or vomiting.  You develop any new or unusual symptoms. Document Released: 02/17/2010 Document Revised: 06/01/2013 Document Reviewed: 07/08/2012 [facility] Patient Information 2015 [place], [place]. This information is not intended to replace advice given to you by your health care provider. Make sure you discuss any questions you have with your health care provider.     Post Anesthesia Home Care Instructions  Activity: Get plenty of rest for the remainder of the day. A responsible adult should stay  with you for 24 hours following the procedure.  For the next 24 hours, DO NOT: -Drive a car -Paediatric nurse -Drink alcoholic beverages -Take any medication unless instructed by your physician -Make any legal decisions or sign important papers.  Meals: Start with liquid foods such as gelatin or soup. Progress to regular foods as tolerated. Avoid greasy, spicy, heavy foods. If nausea and/or vomiting occur, drink only clear liquids until the nausea and/or vomiting subsides. Call your physician if vomiting continues.  Special Instructions/Symptoms: Your throat may feel dry or sore from the anesthesia or the breathing tube placed in your throat during surgery. If this causes discomfort, gargle with warm salt water. The discomfort should disappear within  24 hours.

## 2013-12-31 NOTE — Anesthesia Preprocedure Evaluation (Signed)
Anesthesia Evaluation  Patient identified by MRN, date of birth, ID band Patient awake    Reviewed: Allergy & Precautions, H&P , NPO status , Patient's Chart, lab work & pertinent test results  Airway Mallampati: II  TM Distance: >3 FB Neck ROM: Full    Dental no notable dental hx.    Pulmonary neg pulmonary ROS, former smoker,  breath sounds clear to auscultation  Pulmonary exam normal       Cardiovascular hypertension, Pt. on medications + CAD, + Peripheral Vascular Disease and DVT Rhythm:Regular Rate:Normal  cardiologist-- dr Johnsie Cancel-- [facility] Fling 07-05-2010 poss. small inferior wall infact/ no ischemia/ ef 51%/ cardic CT score 28 and <50% LAD/D! disease   Neuro/Psych negative neurological ROS  negative psych ROS   GI/Hepatic negative GI ROS, Neg liver ROS,   Endo/Other  negative endocrine ROS  Renal/GU negative Renal ROS  negative genitourinary   Musculoskeletal negative musculoskeletal ROS (+)   Abdominal   Peds negative pediatric ROS (+)  Hematology negative hematology ROS (+)   Anesthesia Other Findings   Reproductive/Obstetrics negative OB ROS                             Anesthesia Physical Anesthesia Plan  ASA: III  Anesthesia Plan: General   Post-op Pain Management:    Induction: Intravenous  Airway Management Planned: LMA  Additional Equipment:   Intra-op Plan:   Post-operative Plan: Extubation in OR  Informed Consent: I have reviewed the patients History and Physical, chart, labs and discussed the procedure including the risks, benefits and alternatives for the proposed anesthesia with the patient or authorized representative who has indicated his/her understanding and acceptance.   Dental advisory given  Plan Discussed with: CRNA and Surgeon  Anesthesia Plan Comments:         Anesthesia Quick Evaluation

## 2013-12-31 NOTE — Procedures (Signed)
  Radiation Oncology         [phone] ________________________________  Name: Dominic Shaffer MRN: [id number]  Date: 12/31/2013  DOB: 03/10/1940           Prostate Seed Implant  ME:QAST Plotnikov, MD  No ref. provider found  DIAGNOSIS: 73 y.o. gentleman with stage T1c adenocarcinoma of the prostate with a Gleason's score of 3+3 and a PSA of 4.87    ICD-9-CM ICD-10-CM   1. Prostate cancer 185 C61 DG Chest 2 View     DG Chest 2 View    PROCEDURE: Insertion of radioactive I-125 seeds into the prostate gland.  RADIATION DOSE: 145 Gy, definitive therapy.  TECHNIQUE: Dominic Shaffer was brought to the operating room with the urologist. He was placed in the dorsolithotomy position. He was catheterized and a rectal tube was inserted. The perineum was shaved, prepped and draped. The ultrasound probe was then introduced into the rectum to see the prostate gland.  TREATMENT DEVICE: A needle grid was attached to the ultrasound probe stand and anchor needles were placed.  3D PLANNING: The prostate was imaged in 3D using a sagittal sweep of the prostate probe. These images were transferred to the planning computer. There, the prostate, urethra and rectum were defined on each axial reconstructed image. Then, the software created an optimized 3D plan and a few seed positions were adjusted. The quality of the plan was reviewed using [facility] information for the target and the following two organs at risk:  Urethra and Rectum.  Then the accepted plan was uploaded to the seed Selectron afterloading unit.  PROSTATE VOLUME STUDY:  Using transrectal ultrasound the volume of the prostate was verified.  SPECIAL TREATMENT PROCEDURE/SUPERVISION AND HANDLING: The Nucletron FIRST system was used to place the needles under sagittal guidance. A total of 25 needles were used to deposit 78 seeds in the prostate gland. The individual seed activity was 0.462 mCi for a total implant activity of 36.04  mCi.  COMPLEX SIMULATION: At the end of the procedure, an anterior radiograph of the pelvis was obtained to document seed positioning and count. Cystoscopy was performed to check the urethra and bladder.  MICRODOSIMETRY: At the end of the procedure, the patient was emitting 0.26 mrem/hr at 1 meter. Accordingly, he was considered safe for hospital discharge.  PLAN: The patient will return to the radiation oncology clinic for post implant CT dosimetry in three weeks.   ________________________________  Sheral Apley Tammi Klippel, M.D.

## 2013-12-31 NOTE — Op Note (Signed)
PATIENT:  [name]  PRE-OPERATIVE DIAGNOSIS:  Adenocarcinoma of the prostate  POST-OPERATIVE DIAGNOSIS:  Same  PROCEDURE:  Procedure(s): 1. I-125 radioactive seed implantation 2. Cystoscopy  SURGEON:  Surgeon(s): Irine Seal MD  Radiation oncologist: Dr. Tyler Pita  ANESTHESIA:  General  EBL:  Minimal  DRAINS: 37 French Foley catheter  INDICATION: Dominic Shaffer is a 73 y.o. with Stage t1c , Gleason 6 prostate cancer who has elected brachytherapy for treatment.  Description of procedure: After informed consent the patient was brought to the major OR, placed on the table and administered general anesthesia. He was then moved to the modified lithotomy position with his perineum perpendicular to the floor. His perineum and genitalia were then sterilely prepped. An official timeout was then performed. A 16 French Foley catheter was then placed in the bladder and filled with dilute contrast, a rectal tube was placed in the rectum and the transrectal ultrasound probe was placed in the rectum and affixed to the stand. He was then sterilely draped.  The sterile grid was installed.   Anchor needles were then placed.   Real time ultrasonography was used along with the seed planning software. This was used to develop the seed plan including the number of needles as well as number of seeds required for complete and adequate coverage. Real-time ultrasonography was then used along with the previously developed plan and the Nucletron device to implant a total of 78 seeds using 25 needles for a target dose of 145 Gy. This proceeded without difficulty or complication.  A Foley catheter was then removed as well as the transrectal ultrasound probe and rectal probe. Flexible cystoscopy was then performed using the 17 French flexible scope which revealed a normal urethra throughout its length down to the sphincter which appeared intact. The prostatic urethra was 3cm with bilobar  hyperplasia with mild obstruction. The bladder was then entered and fully and systematically.  The ureteral orifices were noted to be of normal configuration and position. The mucosa revealed no evidence of tumors. There were also no stones identified within the bladder.  No seeds or spacers were seen and/or removed from the bladder.  The cystoscope was then removed.  The drapes were removed.  The perineum was cleaned and dressed.  He was taken out of the lithotomy position and was awakened and taken to recovery room in stable and satisfactory condition. He tolerated procedure well and there were no intraoperative complications.

## 2013-12-31 NOTE — Transfer of Care (Signed)
Immediate Anesthesia Transfer of Care Note  Patient: Dominic Shaffer  Procedure(s) Performed: Procedure(s) (LRB): RADIOACTIVE SEED IMPLANT (N/A)  Patient Location: PACU  Anesthesia Type: General  Level of Consciousness: awake, oriented, sedated and patient cooperative  Airway & Oxygen Therapy: Patient Spontanous Breathing and Patient connected to face mask oxygen  Post-op Assessment: Report given to PACU RN and Post -op Vital signs reviewed and stable  Post vital signs: Reviewed and stable  Complications: No apparent anesthesia complications

## 2014-01-01 ENCOUNTER — Other Ambulatory Visit: Payer: Self-pay | Admitting: Internal Medicine

## 2014-01-01 ENCOUNTER — Encounter (HOSPITAL_BASED_OUTPATIENT_CLINIC_OR_DEPARTMENT_OTHER): Payer: Self-pay | Admitting: Urology

## 2014-01-15 ENCOUNTER — Ambulatory Visit
Admission: RE | Admit: 2014-01-15 | Discharge: 2014-01-15 | Disposition: A | Discharge status: Home / Self Care | Payer: Medicare Other | Source: Ambulatory Visit | Attending: Radiation Oncology | Admitting: Radiation Oncology

## 2014-01-15 DIAGNOSIS — C61 Malignant neoplasm of prostate: Secondary | ICD-10-CM

## 2014-01-20 DIAGNOSIS — C61 Malignant neoplasm of prostate: Secondary | ICD-10-CM | POA: Diagnosis not present

## 2014-01-27 ENCOUNTER — Telehealth: Payer: Self-pay | Admitting: *Deleted

## 2014-01-27 NOTE — Progress Notes (Signed)
Radiation Oncology         [phone]) [phone] ________________________________   Name: Dominic Shaffer MRN: [id number]  Date: 01/28/2014  DOB: 1940/08/17  Follow-Up Visit Note  CC: Walker Kehr, MD  Malka So, MD  Diagnosis:   73 y.o. gentleman with stage T1c adenocarcinoma of the prostate with a Gleason's score of 3+3 and a PSA of 4.87    ICD-9-CM ICD-10-CM   1. Malignant neoplasm of prostate 185 C61     Interval Since Last Radiation:  3  weeks  Narrative:  The patient returns today for routine follow-up.  He is complaining of increased urinary frequency and urinary hesitation symptoms. He filled out a questionnaire regarding urinary function today providing and overall IPSS score of 15 characterizing his symptoms as moderate.  His pre-implant score was actually 17. He denies any bowel symptoms.  ALLERGIES:  is allergic to iodinated diagnostic agents and shellfish allergy.  Meds: Current Outpatient Prescriptions  Medication Sig Dispense Refill  . amLODipine (NORVASC) 5 MG tablet TAKE ONE TABLET BY MOUTH ONCE DAILY. (Patient taking differently: TAKE ONE TABLET BY MOUTH ONCE DAILY.---  TAKES IN AM) 90 tablet 2  . aspirin EC 81 MG tablet Take 81 mg by mouth daily.    . finasteride (PROSCAR) 5 MG tablet Take 5 mg by mouth at bedtime.    Marland Kitchen losartan-hydrochlorothiazide (HYZAAR) 100-25 MG per tablet TAKE ONE TABLET BY MOUTH ONCE DAILY 90 tablet 1  . terazosin (HYTRIN) 5 MG capsule TAKE ONE CAPSULE BY MOUTH AT BEDTIME 30 capsule 5  . ciprofloxacin (CIPRO) 500 MG tablet Take 1 tablet (500 mg total) by mouth 2 (two) times daily. (Patient not taking: Reported on 01/28/2014) 6 tablet 0  . HYDROcodone-acetaminophen (NORCO) 5-325 MG per tablet Take 1 tablet by mouth every 6 (six) hours as needed for moderate pain. (Patient not taking: Reported on 01/28/2014) 15 tablet 0   No current facility-administered medications for this encounter.    Physical Findings: The patient is in no acute  distress. Patient is alert and oriented.  height is 5\' 10"  (1.778 m) and weight is 228 lb 1.6 oz (103.465 kg). His oral temperature is 98.2 F (36.8 C). His blood pressure is 140/61 and his pulse is 67. His respiration is 20. Marland Kitchen  No significant changes.  Lab Findings: Lab Results  Component Value Date   WBC 9.8 12/28/2013   HGB 12.9* 12/28/2013   HCT 40.4 12/28/2013   MCV 80.5 12/28/2013   PLT 216 12/28/2013    Radiographic Findings:  Patient underwent CT imaging in our clinic for post implant dosimetry. The CT appears to demonstrate an adequate distribution of radioactive seeds throughout the prostate gland. There no seeds in her near the rectum. I suspect the final radiation plan and dosimetry will show appropriate coverage of the prostate gland.   Impression: The patient is recovering from the effects of radiation. His urinary symptoms should gradually improve over the next 4-6 months. We talked about this today. He is encouraged by his improvement already and is otherwise please with his outcome.   Plan: Today, I spent time talking to the patient about his prostate seed implant and resolving urinary symptoms. We also talked about long-term follow-up for prostate cancer following seed implant. He understands that ongoing PSA determinations and digital rectal exams will help perform surveillance to rule out disease recurrence. He understands what to expect with his PSA measures. Patient was also educated today about some of the long-term effects from radiation  including a small risk for rectal bleeding and possibly erectile dysfunction. We talked about some of the general management approaches to these potential complications. However, I did encourage the patient to contact our office or return at any point if he has questions or concerns related to his previous radiation and prostate cancer.  _____________________________________  Sheral Apley. Tammi Klippel, M.D.

## 2014-01-27 NOTE — Telephone Encounter (Signed)
CALLED PATIENT TO REMIND OF APPTS. FOR 01-28-14, SPOKE  WITH PATIENT AND HE IS AWARE OF THESE APPTS.

## 2014-01-27 NOTE — Progress Notes (Signed)
  Radiation Oncology         [phone]) [phone] ________________________________  Name: Dominic Shaffer MRN: [id number]  Date: 01/28/2014  DOB: 1940-05-29  COMPLEX SIMULATION NOTE  NARRATIVE:  The patient was brought to the [facility] today following prostate seed implantation approximately one month ago.  Identity was confirmed.  All relevant records and images related to the planned course of therapy were reviewed.  Then, the patient was set-up supine.  CT images were obtained.  The CT images were loaded into the planning software.  Then the prostate and rectum were contoured.  Treatment planning then occurred.  The implanted iodine 125 seeds were identified by the physics staff for projection of radiation distribution  I have requested : 3D Simulation  I have requested a DVH of the following structures: Prostate and rectum.    ________________________________  Sheral Apley Tammi Klippel, M.D.

## 2014-01-28 ENCOUNTER — Encounter: Payer: Self-pay | Admitting: Radiation Oncology

## 2014-01-28 ENCOUNTER — Ambulatory Visit
Admit: 2014-01-28 | Discharge: 2014-01-28 | Disposition: A | Discharge status: Home / Self Care | Payer: Medicare Other | Attending: Radiation Oncology | Admitting: Radiation Oncology

## 2014-01-28 VITALS — BP 140/61 | HR 67 | Temp 98.2°F | Resp 20 | Ht 70.0 in | Wt 228.1 lb

## 2014-01-28 DIAGNOSIS — C61 Malignant neoplasm of prostate: Secondary | ICD-10-CM

## 2014-01-28 NOTE — Progress Notes (Addendum)
Follow up post ,I-125 radioactive  Seed implantation, prostate ;  65 seeds done with Dr. Jeffie Pollock and Dr.Manning,  Patient alett,has slight dysuria at time,s nocturia x3, not emptying bladder fullat 1/2 the time stated, weak stream 1/2 the time strains to start voiding < 1/2 time, no heaturia, pain at times when voiding, also still gets lightheaded, was over Christmas holidays, none today, regular bowel movements Appetite good, slight fatigue at times, I-PSS score =15,  10:19 AM

## 2014-03-26 ENCOUNTER — Other Ambulatory Visit: Payer: Self-pay | Admitting: Internal Medicine

## 2014-04-20 DIAGNOSIS — C61 Malignant neoplasm of prostate: Secondary | ICD-10-CM | POA: Diagnosis not present

## 2014-04-23 NOTE — Progress Notes (Signed)
  Radiation Oncology         [phone] ________________________________  Name: Dominic Shaffer MRN: [id number]  Date: 01/28/2014  DOB: Apr 08, 1940  3D Planning Note   Prostate Brachytherapy Post-Implant Dosimetry  Diagnosis: 74 y.o. gentleman with stage T1c adenocarcinoma of the prostate with a Gleason's score of 3+3 and a PSA of 4.87  Narrative: On a previous date, Dominic Shaffer returned following prostate seed implantation for post implant planning. He underwent CT scan complex simulation to delineate the three-dimensional structures of the pelvis and demonstrate the radiation distribution.  Since that time, the seed localization, and complex isodose planning with dose volume histograms have now been completed.  Results:   Prostate Coverage - The dose of radiation delivered to the 90% or more of the prostate gland (D90) was 118.6% of the prescription dose. This exceeds our goal of greater than 90%. Rectal Sparing - The volume of rectal tissue receiving the prescription dose or higher was 0.0 cc. This falls under our thresholds tolerance of 1.0 cc.  Impression: The prostate seed implant appears to show adequate target coverage and appropriate rectal sparing.  Plan:  The patient will continue to follow with urology for ongoing PSA determinations. I would anticipate a high likelihood for local tumor control with minimal risk for rectal morbidity.  ________________________________  Sheral Apley Tammi Klippel, M.D.

## 2014-04-28 DIAGNOSIS — R3912 Poor urinary stream: Secondary | ICD-10-CM | POA: Diagnosis not present

## 2014-04-28 DIAGNOSIS — R351 Nocturia: Secondary | ICD-10-CM | POA: Diagnosis not present

## 2014-04-28 DIAGNOSIS — C61 Malignant neoplasm of prostate: Secondary | ICD-10-CM | POA: Diagnosis not present

## 2014-04-28 DIAGNOSIS — N401 Enlarged prostate with lower urinary tract symptoms: Secondary | ICD-10-CM | POA: Diagnosis not present

## 2014-05-16 ENCOUNTER — Other Ambulatory Visit: Payer: Self-pay | Admitting: Internal Medicine

## 2014-05-28 ENCOUNTER — Encounter: Payer: Self-pay | Admitting: Internal Medicine

## 2014-05-28 ENCOUNTER — Ambulatory Visit (INDEPENDENT_AMBULATORY_CARE_PROVIDER_SITE_OTHER): Payer: Medicare Other | Admitting: Internal Medicine

## 2014-05-28 VITALS — BP 108/60 | HR 66 | Wt 230.0 lb

## 2014-05-28 DIAGNOSIS — C61 Malignant neoplasm of prostate: Secondary | ICD-10-CM

## 2014-05-28 DIAGNOSIS — M5441 Lumbago with sciatica, right side: Secondary | ICD-10-CM

## 2014-05-28 DIAGNOSIS — R1030 Lower abdominal pain, unspecified: Secondary | ICD-10-CM | POA: Diagnosis not present

## 2014-05-28 DIAGNOSIS — I10 Essential (primary) hypertension: Secondary | ICD-10-CM | POA: Diagnosis not present

## 2014-05-28 DIAGNOSIS — Z86010 Personal history of colonic polyps: Secondary | ICD-10-CM | POA: Diagnosis not present

## 2014-05-28 MED ORDER — TERAZOSIN HCL 5 MG PO CAPS: 5.0000 mg | ORAL_CAPSULE | Freq: Every day | ORAL | Status: DC

## 2014-05-28 MED ORDER — AMLODIPINE BESYLATE 5 MG PO TABS: 5.0000 mg | ORAL_TABLET | Freq: Every day | ORAL | Status: DC

## 2014-05-28 MED ORDER — LOSARTAN POTASSIUM-HCTZ 100-25 MG PO TABS: 1.0000 | ORAL_TABLET | Freq: Every day | ORAL | Status: DC

## 2014-05-28 MED ORDER — MELOXICAM 7.5 MG PO TABS: 7.5000 mg | ORAL_TABLET | Freq: Every day | ORAL | Status: DC

## 2014-05-28 NOTE — Assessment & Plan Note (Signed)
S/p XRT seeds 12/15 Dr Jeffie Pollock

## 2014-05-28 NOTE — Progress Notes (Signed)
Pre visit review using our clinic review tool, if applicable. No additional management support is needed unless otherwise documented below in the visit note. 

## 2014-05-28 NOTE — Assessment & Plan Note (Addendum)
?  etiology Labs Dr Henrene Pastor

## 2014-05-28 NOTE — Assessment & Plan Note (Signed)
Chronic °Losartan HCT, Amlodipine, Terazosyn °

## 2014-05-28 NOTE — Assessment & Plan Note (Signed)
Meloxicam prn 

## 2014-06-03 ENCOUNTER — Encounter: Payer: Self-pay | Admitting: Internal Medicine

## 2014-06-04 ENCOUNTER — Other Ambulatory Visit (INDEPENDENT_AMBULATORY_CARE_PROVIDER_SITE_OTHER): Payer: Medicare Other

## 2014-06-04 DIAGNOSIS — C61 Malignant neoplasm of prostate: Secondary | ICD-10-CM

## 2014-06-04 DIAGNOSIS — Z86010 Personal history of colonic polyps: Secondary | ICD-10-CM

## 2014-06-04 DIAGNOSIS — R1030 Lower abdominal pain, unspecified: Secondary | ICD-10-CM | POA: Diagnosis not present

## 2014-06-04 DIAGNOSIS — I10 Essential (primary) hypertension: Secondary | ICD-10-CM | POA: Diagnosis not present

## 2014-06-04 DIAGNOSIS — M5441 Lumbago with sciatica, right side: Secondary | ICD-10-CM

## 2014-06-04 LAB — LIPID PANEL
CHOL/HDL RATIO: 4
Cholesterol: 141 mg/dL (ref 0–200)
HDL: 32.2 mg/dL — ABNORMAL LOW (ref >39.00)
LDL Cholesterol: 91 mg/dL (ref 0–99)
NONHDL: 108.8
Triglycerides: 89 mg/dL (ref 0.0–149.0)
VLDL: 17.8 mg/dL (ref 0.0–40.0)

## 2014-06-04 LAB — CBC WITH DIFFERENTIAL/PLATELET
Basophils Absolute: 0 10*3/uL (ref 0.0–0.1)
Basophils Relative: 0.6 % (ref 0.0–3.0)
Eosinophils Absolute: 0.2 10*3/uL (ref 0.0–0.7)
Eosinophils Relative: 3.2 % (ref 0.0–5.0)
HCT: 40.5 % (ref 39.0–52.0)
Hemoglobin: 13.4 g/dL (ref 13.0–17.0)
Lymphocytes Relative: 30.9 % (ref 12.0–46.0)
Lymphs Abs: 1.9 10*3/uL (ref 0.7–4.0)
MCHC: 33.1 g/dL (ref 30.0–36.0)
MCV: 78.9 fl (ref 78.0–100.0)
Monocytes Absolute: 0.7 10*3/uL (ref 0.1–1.0)
Monocytes Relative: 10.8 % (ref 3.0–12.0)
Neutro Abs: 3.4 10*3/uL (ref 1.4–7.7)
Neutrophils Relative %: 54.5 % (ref 43.0–77.0)
Platelets: 224 10*3/uL (ref 150.0–400.0)
RBC: 5.13 Mil/uL (ref 4.22–5.81)
RDW: 13.9 % (ref 11.5–15.5)
WBC: 6.2 10*3/uL (ref 4.0–10.5)

## 2014-06-04 LAB — URINALYSIS
Bilirubin Urine: NEGATIVE
Hgb urine dipstick: NEGATIVE
Ketones, ur: NEGATIVE
Leukocytes, UA: NEGATIVE
Nitrite: NEGATIVE
TOTAL PROTEIN, URINE-UPE24: NEGATIVE
Urine Glucose: NEGATIVE
Urobilinogen, UA: 0.2 (ref 0.0–1.0)
pH: 6 (ref 5.0–8.0)

## 2014-06-04 LAB — BASIC METABOLIC PANEL
BUN: 19 mg/dL (ref 6–23)
CALCIUM: 9.3 mg/dL (ref 8.4–10.5)
CHLORIDE: 106 meq/L (ref 96–112)
CO2: 27 meq/L (ref 19–32)
CREATININE: 1.24 mg/dL (ref 0.40–1.50)
GFR: 73.66 mL/min (ref >60.00)
Glucose, Bld: 90 mg/dL (ref 70–99)
Potassium: 4.1 mEq/L (ref 3.5–5.1)
Sodium: 141 mEq/L (ref 135–145)

## 2014-06-04 LAB — HEPATIC FUNCTION PANEL
ALT: 12 U/L (ref 0–53)
AST: 13 U/L (ref 0–37)
Albumin: 3.5 g/dL (ref 3.5–5.2)
Alkaline Phosphatase: 71 U/L (ref 39–117)
Bilirubin, Direct: 0.1 mg/dL (ref 0.0–0.3)
Total Bilirubin: 0.6 mg/dL (ref 0.2–1.2)
Total Protein: 7.2 g/dL (ref 6.0–8.3)

## 2014-06-04 LAB — SEDIMENTATION RATE: Sed Rate: 23 mm/hr — ABNORMAL HIGH (ref 0–22)

## 2014-06-04 LAB — TSH: TSH: 1.61 u[IU]/mL (ref 0.35–4.50)

## 2014-06-09 NOTE — Progress Notes (Signed)
   Subjective:    Abdominal Pain This is a new problem. The onset quality is gradual. The problem has been waxing and waning. The pain is located in the LLQ. The pain is at a severity of 3/10. The pain is mild. The quality of the pain is aching and dull. The abdominal pain radiates to the suprapubic region. Pertinent negatives include no diarrhea, frequency, hematuria or nausea.    F/u knee OA, DVT,HTN    Review of Systems  Constitutional: Negative for appetite change, fatigue and unexpected weight change.  HENT: Positive for hearing loss (mild ). Negative for congestion, nosebleeds, sneezing, sore throat and trouble swallowing.   Eyes: Negative for itching and visual disturbance.  Respiratory: Negative for cough.   Cardiovascular: Negative for chest pain, palpitations and leg swelling.  Gastrointestinal: Positive for abdominal pain. Negative for nausea, diarrhea, blood in stool and abdominal distention.  Genitourinary: Negative for frequency and hematuria.  Musculoskeletal: Negative for back pain, joint swelling, gait problem and neck pain.  Skin: Negative for rash.  Neurological: Negative for dizziness, tremors, speech difficulty and weakness.  Psychiatric/Behavioral: Negative for sleep disturbance, dysphoric mood and agitation. The patient is not nervous/anxious.    Wt Readings from Last 3 Encounters:  05/28/14 230 lb (104.327 kg)  01/28/14 228 lb 1.6 oz (103.465 kg)  12/31/13 225 lb (102.059 kg)   BP Readings from Last 3 Encounters:  05/28/14 108/60  01/28/14 140/61  12/31/13 121/64        Objective:   Physical Exam  Constitutional: He is oriented to person, place, and time. He appears well-developed.  HENT:  Mouth/Throat: Oropharynx is clear and moist.  Eyes: Conjunctivae are normal. Pupils are equal, round, and reactive to light.  Neck: Normal range of motion. No JVD present. No thyromegaly present.  Cardiovascular: Normal rate, regular rhythm, normal heart sounds  and intact distal pulses.  Exam reveals no gallop and no friction rub.   No murmur heard. Pulmonary/Chest: Effort normal and breath sounds normal. No respiratory distress. He has no wheezes. He has no rales. He exhibits no tenderness.  Abdominal: Soft. Bowel sounds are normal. He exhibits no distension and no mass. There is no tenderness. There is no rebound and no guarding.  Genitourinary: Guaiac negative stool.  Musculoskeletal: Normal range of motion. He exhibits no edema or tenderness.  Lymphadenopathy:    He has no cervical adenopathy.  Neurological: He is alert and oriented to person, place, and time. He has normal reflexes. No cranial nerve deficit. He exhibits normal muscle tone. Coordination normal.  Skin: Skin is warm and dry. No rash noted.  Psychiatric: He has a normal mood and affect. His behavior is normal. Judgment and thought content normal.  Prostate 1+, NT  Lab Results  Component Value Date   WBC 6.2 06/04/2014   HGB 13.4 06/04/2014   HCT 40.5 06/04/2014   PLT 224.0 06/04/2014   GLUCOSE 90 06/04/2014   CHOL 141 06/04/2014   TRIG 89.0 06/04/2014   HDL 32.20* 06/04/2014   LDLCALC 91 06/04/2014   ALT 12 06/04/2014   AST 13 06/04/2014   NA 141 06/04/2014   K 4.1 06/04/2014   CL 106 06/04/2014   CREATININE 1.24 06/04/2014   BUN 19 06/04/2014   CO2 27 06/04/2014   TSH 1.61 06/04/2014   PSA 1.70 04/29/2007   INR 1.02 12/28/2013        Assessment & Plan:

## 2014-06-10 NOTE — Telephone Encounter (Signed)
x

## 2014-07-29 ENCOUNTER — Other Ambulatory Visit (INDEPENDENT_AMBULATORY_CARE_PROVIDER_SITE_OTHER): Payer: Medicare Other

## 2014-07-29 ENCOUNTER — Ambulatory Visit (INDEPENDENT_AMBULATORY_CARE_PROVIDER_SITE_OTHER): Payer: Medicare Other | Admitting: Internal Medicine

## 2014-07-29 ENCOUNTER — Encounter: Payer: Self-pay | Admitting: Internal Medicine

## 2014-07-29 VITALS — BP 120/64 | HR 68 | Ht 70.0 in | Wt 229.2 lb

## 2014-07-29 DIAGNOSIS — Z860100 Personal history of colon polyps, unspecified: Secondary | ICD-10-CM

## 2014-07-29 DIAGNOSIS — K220 Achalasia of cardia: Secondary | ICD-10-CM | POA: Diagnosis not present

## 2014-07-29 DIAGNOSIS — R1030 Lower abdominal pain, unspecified: Secondary | ICD-10-CM

## 2014-07-29 DIAGNOSIS — R102 Pelvic and perineal pain: Secondary | ICD-10-CM

## 2014-07-29 LAB — BASIC METABOLIC PANEL
BUN: 22 mg/dL (ref 6–23)
CALCIUM: 9.2 mg/dL (ref 8.4–10.5)
CHLORIDE: 103 meq/L (ref 96–112)
CO2: 30 mEq/L (ref 19–32)
Creatinine, Ser: 1.12 mg/dL (ref 0.40–1.50)
GFR: 82.8 mL/min (ref >60.00)
Glucose, Bld: 92 mg/dL (ref 70–99)
Potassium: 4.1 mEq/L (ref 3.5–5.1)
Sodium: 138 mEq/L (ref 135–145)

## 2014-07-29 MED ORDER — NA SULFATE-K SULFATE-MG SULF 17.5-3.13-1.6 GM/177ML PO SOLN: 1.0000 | Freq: Once | ORAL | Status: DC

## 2014-07-29 NOTE — Patient Instructions (Addendum)
  Your physician has requested that you go to the basement for the following lab work before leaving today:  BMET    You have been scheduled for a CT scan of the abdomen and pelvis at [facility] ([address]---this is in the same building as Press [facility]).   You are scheduled on 08/10/2014 at 9:30am. You should arrive 15 minutes prior to your appointment time for registration. Please follow the written instructions below on the day of your exam:  WARNING: IF YOU ARE ALLERGIC TO IODINE/X-RAY DYE, PLEASE NOTIFY RADIOLOGY IMMEDIATELY AT [phone]! YOU WILL BE GIVEN A 13 HOUR PREMEDICATION PREP.  1) Do not eat or drink anything after 3:30am (4 hours prior to your test) 2) You have been given 2 bottles of oral contrast to drink. The solution may taste better if refrigerated, but do NOT add ice or any other liquid to this solution. Shake well before drinking.    Drink 1 bottle of contrast @ 7:30am (2 hours prior to your exam)  Drink 1 bottle of contrast @ 8:30am (1 hour prior to your exam)  You may take any medications as prescribed with a small amount of water except for the following: Metformin, Glucophage, Glucovance, Avandamet, Riomet, Fortamet, Actoplus Met, Janumet, Glumetza or Metaglip. The above medications must be held the day of the exam AND 48 hours after the exam.  The purpose of you drinking the oral contrast is to aid in the visualization of your intestinal tract. The contrast solution may cause some diarrhea. Before your exam is started, you will be given a small amount of fluid to drink. Depending on your individual set of symptoms, you may also receive an intravenous injection of x-ray contrast/dye. Plan on being at [facility] for 30 minutes or long, depending on the type of exam you are having performed.   You have been scheduled for a colonoscopy. Please follow written instructions given to you at your visit today.  Please pick up your prep  supplies at the pharmacy within the next 1-3 days. If you use inhalers (even only as needed), please bring them with you on the day of your procedure.  If you have any questions regarding your exam or if you need to reschedule, you may call the CT department at [phone] between the hours of 8:00 am and 5:00 pm, Monday-Friday.  ________________________________________________________________________

## 2014-07-29 NOTE — Progress Notes (Signed)
HISTORY OF PRESENT ILLNESS:  Dominic Shaffer is a 74 y.o. male with multiple medical problems as listed below. He is referred by his primary care provider, Dr. Alain Marion, for consultative evaluation of his chronic persistent lower abdominal pain. The patient has a history of achalasia for which she is status post laparoscopic Heller myotomy in 2003. He has done beautifully since. He is also had prior screening colonoscopy in March 2009. Mild sigmoid diverticulosis and 6 mm sessile polyp cecum removed and found to be hyperplastic. 5 year follow-up recommended. The current history is that of a very low abdominal/suprapubic pain which occurs 2-3 times per week. The discomfort seems to be across the lower portion of the abdomen or pelvis in a bandlike fashion. More noticeable in the morning and late afternoon. He describes it as a dull ache. He cannot identify any exacerbating or relieving factors. He has had no change in his bowel habits or bleeding. The patient states that his discomfort began shortly before undergoing radioactive seed implantation for early stage prostate cancer. To him, the problem seems somewhat progressive since. He has had no weight loss. No evaluation of this problem. He is due to see his urologist in one month. Review of outside data includes laboratories from May 2016. Normal CBC, comprehensive metabolic panel, and TSH.  REVIEW OF SYSTEMS:  All non-GI ROS negative except for back pain, excessive urination  Past Medical History  Diagnosis Date  . Hypertension   . LBP (low back pain)   . Internal hemorrhoid 2003    Dr. Henrene Pastor  . Prostate cancer   . Hematospermia   . Depression   . LBBB (left bundle branch block)   . History of DVT of lower extremity     06/ 2012  . BPH with urinary obstruction     hyperplasia  . Congenital renal cyst, single   . ED (erectile dysfunction)     secondary arterial insuffiency  . History of esophageal dilatation     and botox injection  tx's at [facility]  . Esophageal achalasia     s/p multiple dilatations and botox injection tx and surgical intervention 06-17-2011  [facility] Myotomy  . Asymptomatic stenosis of left carotid artery without infarction     per duplex LICA 53-61% ([id number])  . Nocturia   . Wears glasses   . At risk for sleep apnea     STOP-BANG= 4     SENT TO PCP 12-23-2013  . Coronary artery disease     cardiologist--  dr Johnsie Cancel-- myoview 07-05-2010 poss. small inferior wall infact/  no ischemia/ ef 51%/  cardic CT score 28 and <50% LAD/D! disease  . Colon polyps   . Diverticulitis     Past Surgical History  Procedure Laterality Date  . Inguinal hernia repair Left 10/13/2012    Procedure: OPEN LEFT INGUINAL HERNIA REPAIR WITH ON Q PUMP;  Surgeon: Odis Hollingshead, MD;  Location: [facility];  Service: General;  Laterality: Left;  . Insertion of mesh Left 10/13/2012    Procedure: INSERTION OF MESH;  Surgeon: Odis Hollingshead, MD;  Location: [facility];  Service: General;  Laterality: Left;  . Laparoscopic heller myotomy  06-16-2001    w/ intraoperative upper endoscopy [place] (for achalasia)  . Umbilical hernia repair  11-11-2005  . Colonoscopy  last one 2009  . Cardiovascular stress test  07-05-2010  dr Johnsie Cancel    Adenosine nuclear study/  no significant ST segment change suggestive of ischemia/  possible small inferior wall  infarct at mid and basal level/ ef 51%/  LBBB/  normal wall motion  . Transthoracic echocardiogram  07-05-2010    mild LVH/ septal motion consistent w/ LBBB/ ef 45-50%/  diffuse hypokinesis/  grade I diastolic dysfunction/ mild LAE/  trivial TR  . Radioactive seed implant N/A 12/31/2013    Procedure: RADIOACTIVE SEED IMPLANT;  Surgeon: Malka So, MD;  Location: [facility];  Service: Urology;  Laterality: N/A;  seeds implanted 78 no seeds found in bladder    Social History Dominic Shaffer  reports that he quit smoking about 49 years ago. His smoking use included  Cigarettes. He has a .125 pack-year smoking history. He has never used smokeless tobacco. He reports that he does not drink alcohol or use illicit drugs.  family history includes Cancer (age of onset: 43) in his father; Heart disease (age of onset: 52) in his mother; Hypertension in his other; Kidney disease in his mother.  Allergies  Allergen Reactions  . Iodinated Diagnostic Agents Hives  . Shellfish Allergy Hives    MAINLY -SHRIMP       PHYSICAL EXAMINATION: Vital signs: BP 120/64 mmHg  Pulse 68  Ht 5\' 10"  (1.778 m)  Wt 229 lb 3.2 oz (103.964 kg)  BMI 32.89 kg/m2  Constitutional: generally well-appearing, no acute distress Psychiatric: alert and oriented x3, cooperative Eyes: extraocular movements intact, anicteric, conjunctiva pink Mouth: oral pharynx moist, no lesions Neck: supple no lymphadenopathy Cardiovascular: heart regular rate and rhythm, no murmur Lungs: clear to auscultation bilaterally Abdomen: soft, nontender, nondistended, no obvious ascites, no peritoneal signs, normal bowel sounds, no organomegaly Rectal: Deferred until colonoscopy Extremities: no clubbing cyanosis or lower extremity edema bilaterally Skin: no lesions on visible extremities Neuro: No focal deficits. No asterixis.  ASSESSMENT:  #1. Lower abdominal/pelvic discomfort. Etiology unclear. No worrisome features, though progressive #2. History of sessile plastic polyp of the proximal colon greater than 5 mm. Due for follow-up #3. History of achalasia status post Heller myotomy 2003 with excellent results  PLAN:  #1. Schedule contrast-enhanced CT scan of the abdomen and pelvis to evaluate chronic progressive lower abdominal/pelvic pain #2. Schedule colonoscopy for polyp surveillance and to evaluate pain and CT unrevealing.The nature of the procedure, as well as the risks, benefits, and alternatives were carefully and thoroughly reviewed with the patient. Ample time for discussion and questions  allowed. The patient understood, was satisfied, and agreed to proceed.  A copy of this consultation note has been sent to Dr. Alain Marion

## 2014-08-03 ENCOUNTER — Telehealth: Payer: Self-pay

## 2014-08-03 MED ORDER — PREDNISONE 50 MG PO TABS: ORAL_TABLET | ORAL | Status: DC

## 2014-08-03 MED ORDER — DIPHENHYDRAMINE HCL 25 MG PO TABS: ORAL_TABLET | ORAL | Status: DC

## 2014-08-03 NOTE — Telephone Encounter (Signed)
-----   Message from Irene Shipper, MD sent at 08/03/2014 10:42 AM EDT ----- If he has done okay previously being premedicated, and it is okay with radiology, premedicate been CT would be fine. Otherwise, MRI. ----- Message -----    From: Audrea Muscat, CMA    Sent: 08/03/2014  10:20 AM      To: Irene Shipper, MD  Wanted to clarify before I change the test - patient's allergy to dye involves hives - he has had it before with a cardiac scan using the pre medication protocol of Prednisone and Benadryl with success.  Should he continue with the CT and the pre medication or should I schedule an MRI?   ----- Message -----    From: Irene Shipper, MD    Sent: 07/30/2014  10:49 AM      To: Audrea Muscat, CMA  If he has significant dye allergy, then MRI of the abdomen and pelvis would be recommended as an alternative ----- Message -----    From: Audrea Muscat, CMA    Sent: 07/30/2014  10:30 AM      To: Irene Shipper, MD  Patient is scheduled for upcoming CT but is allergic to the dye.  He can do the allergy prep we do for such patients that involves Benedryl and Prednison.  Would you like to proceed with him following that prep?

## 2014-08-03 NOTE — Telephone Encounter (Signed)
Spoke with patient to let him know we could proceed with the CT scheduled for 08/10/2014 at 9:30am with the pre-medication protocol because of his dye allergy.  I told him I would send an rx for Prednisone to his pharmacy to take as follows:  1 tablet 13 hours prior to test 8:30pm, 1 tablet 7 hours prior to test (2:30pm) and 1 tablet 1 hour prior to test (8:30am).  I also instructed him to buy Benadryl over the counter and take 2 tablets (50mg ) 1 hour prior to test (8:30am).  Patient acknowledged and understood.  I encouraged him to call with any questions and he agreed

## 2014-08-06 ENCOUNTER — Telehealth: Payer: Self-pay | Admitting: Internal Medicine

## 2014-08-06 NOTE — Telephone Encounter (Signed)
Spoke with pt and reviewed the prednisone and benadryl instructions with the pt for premeds for CT scan. Pt verbalized understanding.

## 2014-08-07 ENCOUNTER — Encounter: Payer: Self-pay | Admitting: Internal Medicine

## 2014-08-07 ENCOUNTER — Other Ambulatory Visit: Payer: Self-pay | Admitting: Internal Medicine

## 2014-08-09 ENCOUNTER — Telehealth: Payer: Self-pay | Admitting: Internal Medicine

## 2014-08-09 NOTE — Telephone Encounter (Signed)
Lm on vm that I had called the pharmacy directly and given them the Prednisone rx.  They already have the [place].  I told him to get the Benedryl over the counter unless he thought his insurance would cover it, in which case I would send it in for him at his request.

## 2014-08-10 ENCOUNTER — Other Ambulatory Visit: Payer: Medicare Other

## 2014-08-10 ENCOUNTER — Ambulatory Visit (INDEPENDENT_AMBULATORY_CARE_PROVIDER_SITE_OTHER)
Admission: RE | Admit: 2014-08-10 | Discharge: 2014-08-10 | Disposition: A | Discharge status: Home / Self Care | Payer: Medicare Other | Source: Ambulatory Visit | Attending: Internal Medicine | Admitting: Internal Medicine

## 2014-08-10 DIAGNOSIS — R102 Pelvic and perineal pain: Secondary | ICD-10-CM

## 2014-08-10 DIAGNOSIS — Z860100 Personal history of colon polyps, unspecified: Secondary | ICD-10-CM

## 2014-08-10 DIAGNOSIS — K429 Umbilical hernia without obstruction or gangrene: Secondary | ICD-10-CM | POA: Diagnosis not present

## 2014-08-10 DIAGNOSIS — R1030 Lower abdominal pain, unspecified: Secondary | ICD-10-CM

## 2014-08-10 DIAGNOSIS — K220 Achalasia of cardia: Secondary | ICD-10-CM | POA: Diagnosis not present

## 2014-08-10 DIAGNOSIS — N281 Cyst of kidney, acquired: Secondary | ICD-10-CM | POA: Diagnosis not present

## 2014-08-10 MED ORDER — IOHEXOL 300 MG/ML  SOLN
80.0000 mL | Freq: Once | INTRAMUSCULAR | Status: AC | PRN
  Administered 2014-08-10: 80 mL via INTRAVENOUS

## 2014-08-18 ENCOUNTER — Other Ambulatory Visit: Payer: Self-pay | Admitting: Internal Medicine

## 2014-08-18 DIAGNOSIS — C61 Malignant neoplasm of prostate: Secondary | ICD-10-CM | POA: Diagnosis not present

## 2014-08-19 ENCOUNTER — Other Ambulatory Visit: Payer: Self-pay | Admitting: Internal Medicine

## 2014-08-20 ENCOUNTER — Other Ambulatory Visit: Payer: Self-pay

## 2014-08-20 MED ORDER — TRIAMCINOLONE ACETONIDE 0.5 % EX OINT: TOPICAL_OINTMENT | CUTANEOUS | Status: DC

## 2014-08-25 DIAGNOSIS — N401 Enlarged prostate with lower urinary tract symptoms: Secondary | ICD-10-CM | POA: Diagnosis not present

## 2014-08-25 DIAGNOSIS — Z8546 Personal history of malignant neoplasm of prostate: Secondary | ICD-10-CM | POA: Diagnosis not present

## 2014-08-25 DIAGNOSIS — R351 Nocturia: Secondary | ICD-10-CM | POA: Diagnosis not present

## 2014-08-25 DIAGNOSIS — Q6101 Congenital single renal cyst: Secondary | ICD-10-CM | POA: Diagnosis not present

## 2014-09-17 DIAGNOSIS — N401 Enlarged prostate with lower urinary tract symptoms: Secondary | ICD-10-CM | POA: Diagnosis not present

## 2014-09-17 DIAGNOSIS — R351 Nocturia: Secondary | ICD-10-CM | POA: Diagnosis not present

## 2014-09-17 DIAGNOSIS — R3912 Poor urinary stream: Secondary | ICD-10-CM | POA: Diagnosis not present

## 2014-10-05 ENCOUNTER — Ambulatory Visit (AMBULATORY_SURGERY_CENTER): Payer: Medicare Other | Admitting: Internal Medicine

## 2014-10-05 ENCOUNTER — Encounter: Payer: Self-pay | Admitting: Internal Medicine

## 2014-10-05 VITALS — BP 125/62 | HR 54 | Temp 97.6°F | Resp 18 | Ht 70.0 in | Wt 229.0 lb

## 2014-10-05 DIAGNOSIS — D122 Benign neoplasm of ascending colon: Secondary | ICD-10-CM

## 2014-10-05 DIAGNOSIS — R109 Unspecified abdominal pain: Secondary | ICD-10-CM | POA: Diagnosis not present

## 2014-10-05 DIAGNOSIS — I2510 Atherosclerotic heart disease of native coronary artery without angina pectoris: Secondary | ICD-10-CM | POA: Diagnosis not present

## 2014-10-05 DIAGNOSIS — K635 Polyp of colon: Secondary | ICD-10-CM | POA: Diagnosis not present

## 2014-10-05 DIAGNOSIS — R102 Pelvic and perineal pain: Secondary | ICD-10-CM | POA: Diagnosis not present

## 2014-10-05 DIAGNOSIS — I10 Essential (primary) hypertension: Secondary | ICD-10-CM | POA: Diagnosis not present

## 2014-10-05 DIAGNOSIS — E669 Obesity, unspecified: Secondary | ICD-10-CM | POA: Diagnosis not present

## 2014-10-05 DIAGNOSIS — Z860100 Personal history of colon polyps, unspecified: Secondary | ICD-10-CM

## 2014-10-05 DIAGNOSIS — R1030 Lower abdominal pain, unspecified: Secondary | ICD-10-CM | POA: Diagnosis not present

## 2014-10-05 MED ORDER — SODIUM CHLORIDE 0.9 % IV SOLN: 500.0000 mL | INTRAVENOUS | Status: DC

## 2014-10-05 NOTE — Progress Notes (Signed)
Called to room to assist during endoscopic procedure.  Patient ID and intended procedure confirmed with present staff. Received instructions for my participation in the procedure from the performing physician.  

## 2014-10-05 NOTE — Op Note (Signed)
[facility]  [address] [place], [id number]   COLONOSCOPY PROCEDURE REPORT  PATIENT: Dominic Shaffer, Dominic Shaffer  MR#: [id number] BIRTHDATE: [birth year] , 74  yrs. old GENDER: male ENDOSCOPIST: Eustace Quail, MD REFERRED [id number] Program Recall PROCEDURE DATE:  10/05/2014 PROCEDURE:   Colonoscopy, surveillance and Colonoscopy with snare polypectomy x 1 First Screening Colonoscopy - Avg.  risk and is 50 yrs.  old or older - No.  Prior Negative Screening - Now for repeat screening. N/A  History of Adenoma - Now for follow-up colonoscopy & has been > or = to 3 yrs.  N/A  Polyps removed today? Yes ASA CLASS:   Class III INDICATIONS:Surveillance due to prior colonic neoplasia. . Previous exam March 2009 with 6 mm hyperplastic cecal polyp MEDICATIONS: Monitored anesthesia care and Propofol 200 mg IV  DESCRIPTION OF PROCEDURE:   After the risks benefits and alternatives of the procedure were thoroughly explained, informed consent was obtained.  The digital rectal exam revealed no abnormalities of the rectum.   The LB [id number]  endoscope was introduced through the anus and advanced to the cecum, which was identified by both the appendix and ileocecal valve. No adverse events experienced.   The quality of the prep was excellent. (Suprep was used)  The instrument was then slowly withdrawn as the colon was fully examined. Estimated blood loss is zero unless otherwise noted in this procedure report.      COLON FINDINGS: A single polyp measuring 5 mm in size was found.  A polypectomy was performed with a cold snare.  The resection was complete, the polyp tissue was completely retrieved and sent to histology.   There was mild diverticulosis noted in the sigmoid colon.   The examination was otherwise normal.  Retroflexed views revealed internal hemorrhoids. The time to cecum = 1.4 Withdrawal time = 8.3   The scope was withdrawn and the procedure  completed. COMPLICATIONS: There were no immediate complications.  ENDOSCOPIC IMPRESSION: 1.   Single polyp was found; polypectomy was performed with a cold snare 2.   Mild diverticulosis was noted in the sigmoid colon 3.   The examination was otherwise normal  RECOMMENDATIONS: 1. Repeat colonoscopy in 5 years if polyp adenomatous; otherwise prn   eSigned:  Eustace Quail, MD 10/05/2014 11:43 AM   cc: The Patient and Altamese Lorraine.  Plotnikov, MD

## 2014-10-05 NOTE — Progress Notes (Signed)
To recovery, report given to [place], RN, VSS

## 2014-10-05 NOTE — Patient Instructions (Signed)
YOU HAD AN ENDOSCOPIC PROCEDURE TODAY AT [facility]:   Refer to the procedure report that was given to you for any specific questions about what was found during the examination.  If the procedure report does not answer your questions, please call your gastroenterologist to clarify.  If you requested that your care partner not be given the details of your procedure findings, then the procedure report has been included in a sealed envelope for you to review at your convenience later.  YOU SHOULD EXPECT: Some feelings of bloating in the abdomen. Passage of more gas than usual.  Walking can help get rid of the air that was put into your GI tract during the procedure and reduce the bloating. If you had a lower endoscopy (such as a colonoscopy or flexible sigmoidoscopy) you may notice spotting of blood in your stool or on the toilet paper. If you underwent a bowel prep for your procedure, you may not have a normal bowel movement for a few days.  Please Note:  You might notice some irritation and congestion in your nose or some drainage.  This is from the oxygen used during your procedure.  There is no need for concern and it should clear up in a day or so.  SYMPTOMS TO REPORT IMMEDIATELY:   Following lower endoscopy (colonoscopy or flexible sigmoidoscopy):  Excessive amounts of blood in the stool  Significant tenderness or worsening of abdominal pains  Swelling of the abdomen that is new, acute  Fever of 100F or higher  For urgent or emergent issues, a gastroenterologist can be reached at any hour by calling [phone].   DIET: Your first meal following the procedure should be a small meal and then it is ok to progress to your normal diet. Heavy or fried foods are harder to digest and may make you feel nauseous or bloated.  Likewise, meals heavy in dairy and vegetables can increase bloating.  Drink plenty of fluids but you should avoid alcoholic beverages for 24  hours.  ACTIVITY:  You should plan to take it easy for the rest of today and you should NOT DRIVE or use heavy machinery until tomorrow (because of the sedation medicines used during the test).    FOLLOW UP: Our staff will call the number listed on your records the next business day following your procedure to check on you and address any questions or concerns that you may have regarding the information given to you following your procedure. If we do not reach you, we will leave a message.  However, if you are feeling well and you are not experiencing any problems, there is no need to return our call.  We will assume that you have returned to your regular daily activities without incident.  If any biopsies were taken you will be contacted by phone or by letter within the next 1-3 weeks.  Please call us at [phone] if you have not heard about the biopsies in 3 weeks.    SIGNATURES/CONFIDENTIALITY: You and/or your care partner have signed paperwork which will be entered into your electronic medical record.  These signatures attest to the fact that that the information above on your After Visit Summary has been reviewed and is understood.  Full responsibility of the confidentiality of this discharge information lies with you and/or your care-partner.  Polyp, diverticulosis, high fiber-handouts given  Repeat colonoscopy will be determined by pathology.

## 2014-10-06 ENCOUNTER — Telehealth: Payer: Self-pay

## 2014-10-06 NOTE — Telephone Encounter (Signed)
Left a message at [phone] for the pt to call us back if any questions or concerns. maw

## 2014-10-11 ENCOUNTER — Encounter: Payer: Self-pay | Admitting: Internal Medicine

## 2014-12-29 ENCOUNTER — Telehealth: Payer: Self-pay

## 2014-12-29 NOTE — Telephone Encounter (Signed)
Left message with spouse regarding AWV; Will call the practice to schedule

## 2015-02-28 DIAGNOSIS — Z8546 Personal history of malignant neoplasm of prostate: Secondary | ICD-10-CM | POA: Diagnosis not present

## 2015-03-07 DIAGNOSIS — R351 Nocturia: Secondary | ICD-10-CM | POA: Diagnosis not present

## 2015-03-07 DIAGNOSIS — Q6101 Congenital single renal cyst: Secondary | ICD-10-CM | POA: Diagnosis not present

## 2015-03-07 DIAGNOSIS — N138 Other obstructive and reflux uropathy: Secondary | ICD-10-CM | POA: Diagnosis not present

## 2015-03-07 DIAGNOSIS — N401 Enlarged prostate with lower urinary tract symptoms: Secondary | ICD-10-CM | POA: Diagnosis not present

## 2015-03-07 DIAGNOSIS — N5201 Erectile dysfunction due to arterial insufficiency: Secondary | ICD-10-CM | POA: Diagnosis not present

## 2015-03-07 DIAGNOSIS — Z8546 Personal history of malignant neoplasm of prostate: Secondary | ICD-10-CM | POA: Diagnosis not present

## 2015-03-07 DIAGNOSIS — Z0000 Encounter for general adult medical examination without abnormal findings: Secondary | ICD-10-CM | POA: Diagnosis not present

## 2015-03-13 IMAGING — CR DG CHEST 2V
2 series · 2 of 2 positions shown · non-contrast
Comparison: Chest CT July 19, 2010 and chest radiograph October 09, 2012

CLINICAL DATA: Prostate carcinoma.  History of achalasia

EXAM:
CHEST  2 VIEW

[w chest pa]
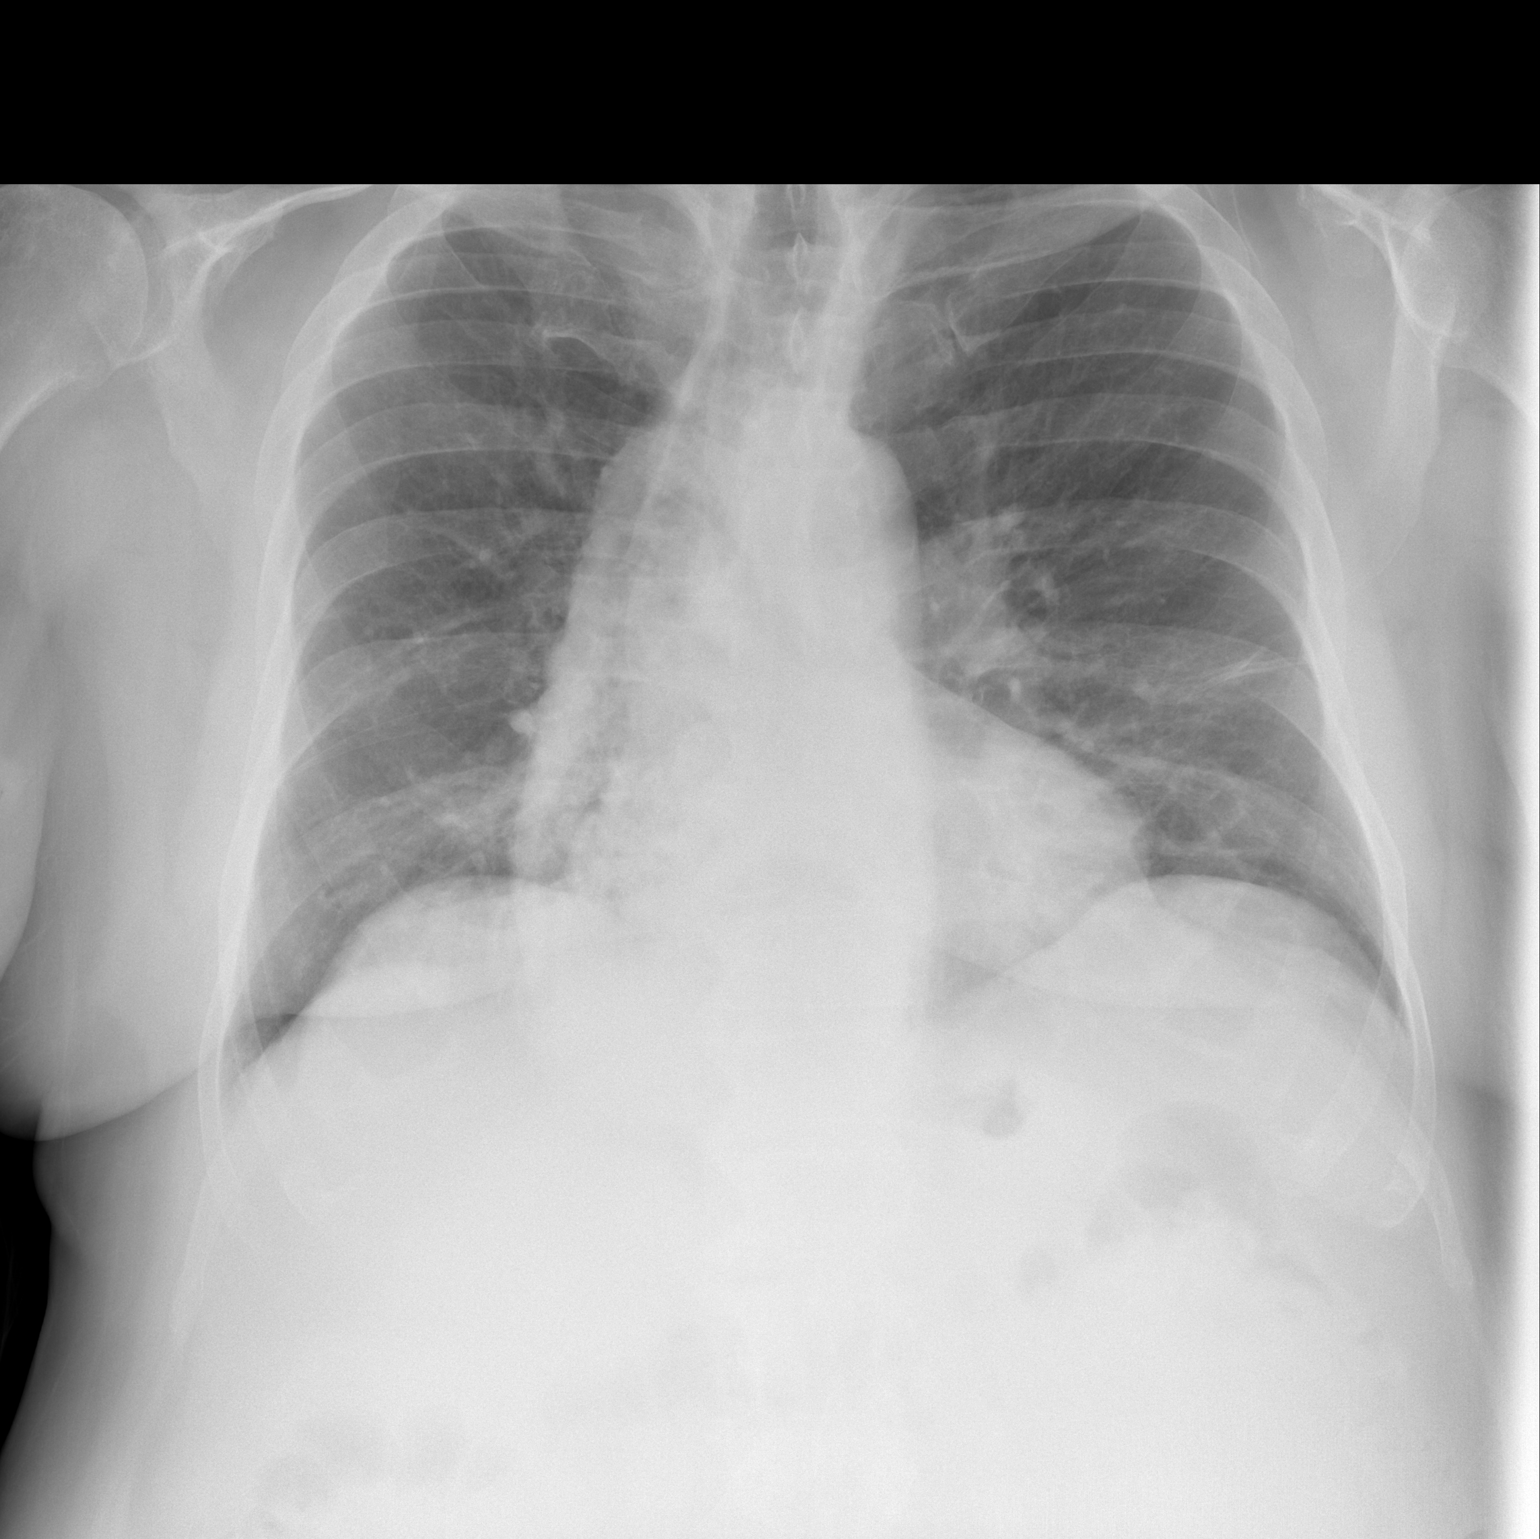

[w chest lat]
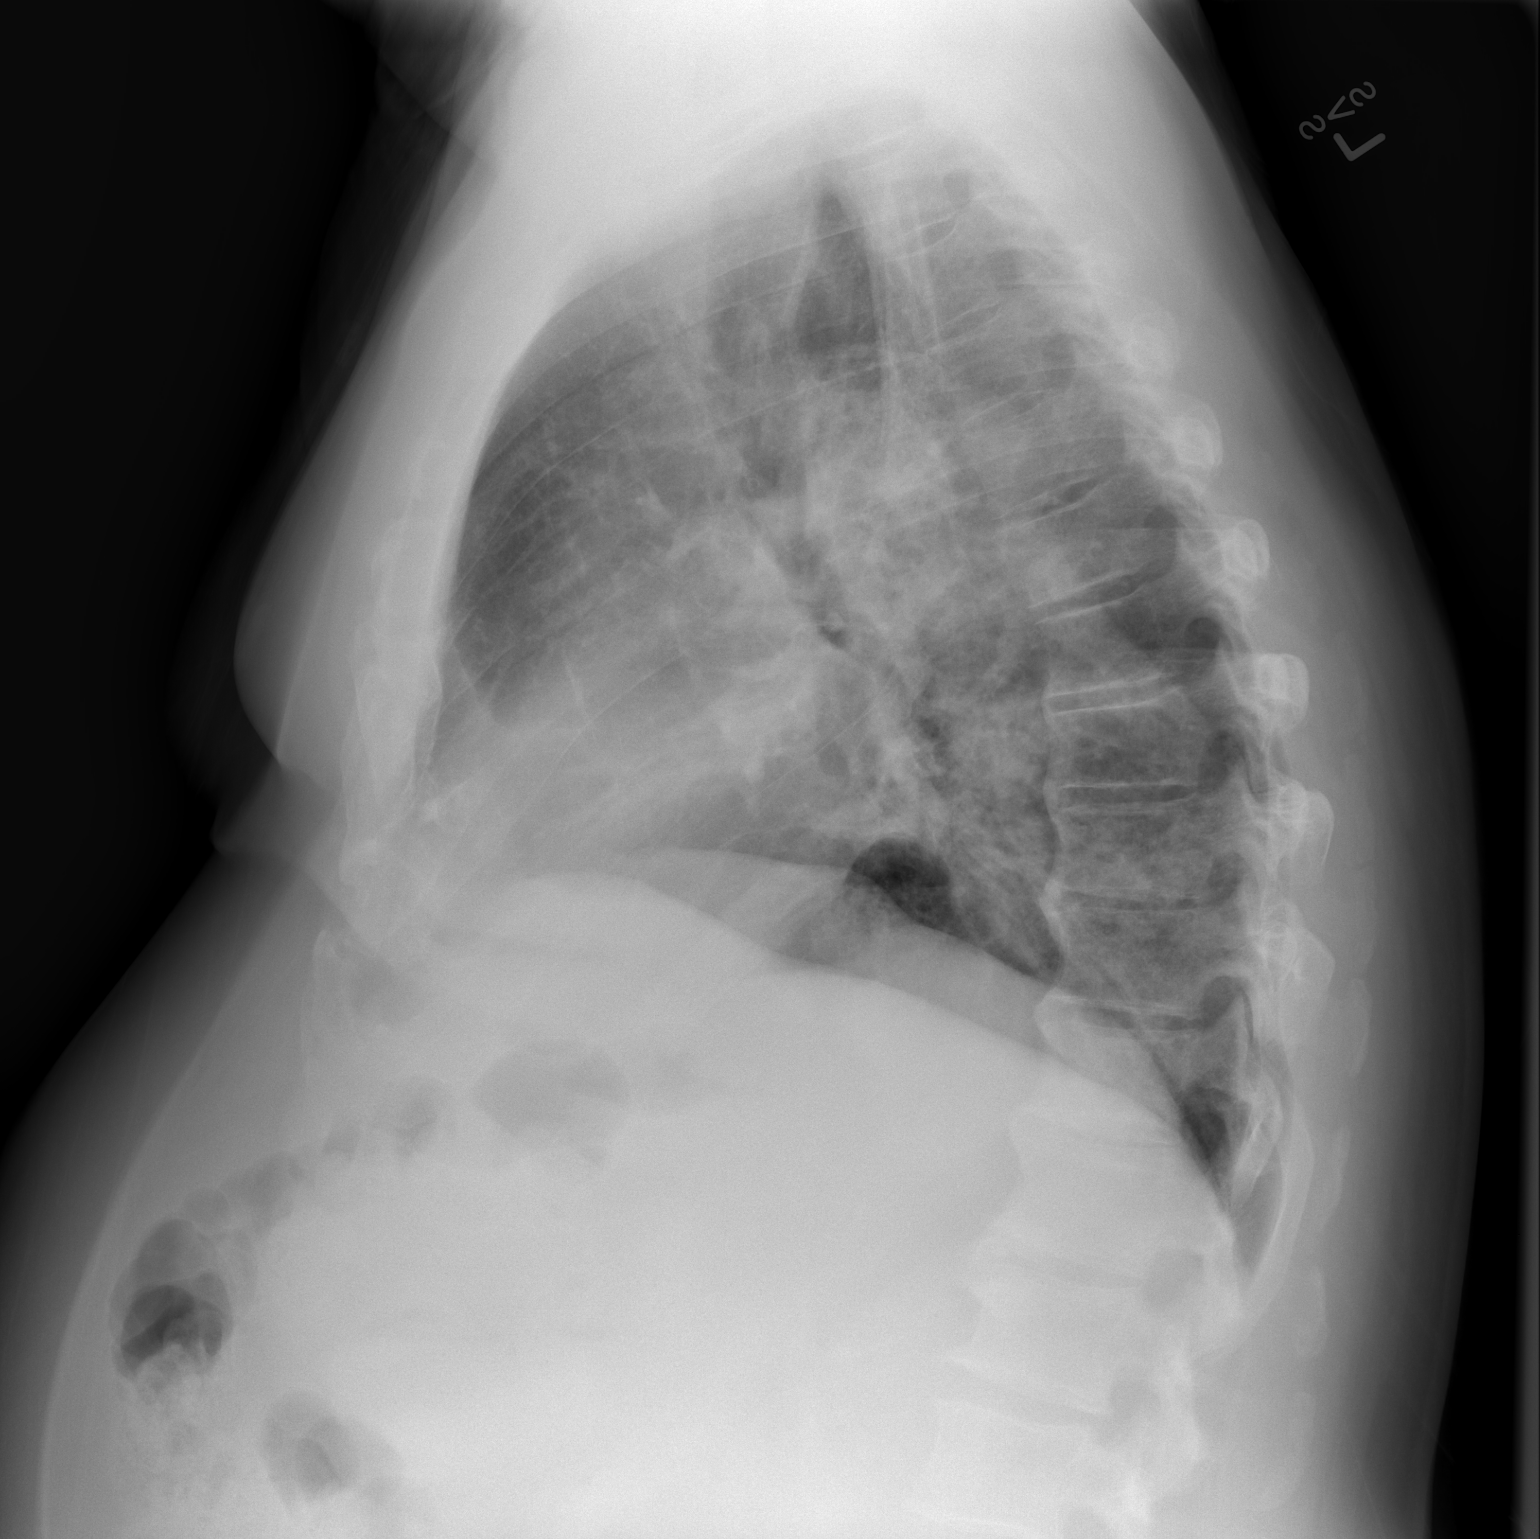

[2 of 2 positions shown; findings below may reference images not displayed]

FINDINGS: There is marked dilatation of the esophagus consistent with known
achalasia. There is mild scarring in the left lower lobe. There is
no edema or consolidation. The heart size and pulmonary vascularity
are normal. There is no appreciable adenopathy. There is
degenerative change in the thoracic spine. There are no demonstrable
blastic or lytic bone lesions.
IMPRESSION: Marked esophageal dilatation consistent with achalasia. Mild
scarring left lower lobe. No edema or consolidation. No neoplastic
focus appreciable.

## 2015-04-26 DIAGNOSIS — N138 Other obstructive and reflux uropathy: Secondary | ICD-10-CM | POA: Diagnosis not present

## 2015-04-26 DIAGNOSIS — M545 Low back pain: Secondary | ICD-10-CM | POA: Diagnosis not present

## 2015-04-26 DIAGNOSIS — Z0000 Encounter for general adult medical examination without abnormal findings: Secondary | ICD-10-CM | POA: Diagnosis not present

## 2015-04-26 DIAGNOSIS — R300 Dysuria: Secondary | ICD-10-CM | POA: Diagnosis not present

## 2015-04-26 DIAGNOSIS — N401 Enlarged prostate with lower urinary tract symptoms: Secondary | ICD-10-CM | POA: Diagnosis not present

## 2015-05-06 ENCOUNTER — Encounter: Payer: Self-pay | Admitting: Internal Medicine

## 2015-05-20 ENCOUNTER — Encounter: Payer: Self-pay | Admitting: Internal Medicine

## 2015-05-20 ENCOUNTER — Ambulatory Visit (INDEPENDENT_AMBULATORY_CARE_PROVIDER_SITE_OTHER): Payer: Medicare Other | Admitting: Internal Medicine

## 2015-05-20 ENCOUNTER — Other Ambulatory Visit (INDEPENDENT_AMBULATORY_CARE_PROVIDER_SITE_OTHER): Payer: Medicare Other

## 2015-05-20 ENCOUNTER — Ambulatory Visit (INDEPENDENT_AMBULATORY_CARE_PROVIDER_SITE_OTHER)
Admission: RE | Admit: 2015-05-20 | Discharge: 2015-05-20 | Disposition: A | Discharge status: Home / Self Care | Payer: Medicare Other | Source: Ambulatory Visit | Attending: Internal Medicine | Admitting: Internal Medicine

## 2015-05-20 VITALS — BP 110/60 | HR 64 | Wt 226.0 lb

## 2015-05-20 DIAGNOSIS — M25572 Pain in left ankle and joints of left foot: Secondary | ICD-10-CM

## 2015-05-20 DIAGNOSIS — M25579 Pain in unspecified ankle and joints of unspecified foot: Secondary | ICD-10-CM | POA: Insufficient documentation

## 2015-05-20 DIAGNOSIS — M7989 Other specified soft tissue disorders: Secondary | ICD-10-CM | POA: Diagnosis not present

## 2015-05-20 DIAGNOSIS — Z0000 Encounter for general adult medical examination without abnormal findings: Secondary | ICD-10-CM

## 2015-05-20 LAB — BASIC METABOLIC PANEL
BUN: 23 mg/dL (ref 6–23)
CALCIUM: 9.1 mg/dL (ref 8.4–10.5)
CO2: 28 mEq/L (ref 19–32)
CREATININE: 1.17 mg/dL (ref 0.40–1.50)
Chloride: 104 mEq/L (ref 96–112)
GFR: 78.55 mL/min (ref >60.00)
Glucose, Bld: 91 mg/dL (ref 70–99)
Potassium: 4.1 mEq/L (ref 3.5–5.1)
Sodium: 139 mEq/L (ref 135–145)

## 2015-05-20 LAB — SEDIMENTATION RATE: SED RATE: 25 mm/h — AB (ref 0–22)

## 2015-05-20 LAB — URIC ACID: URIC ACID, SERUM: 9.2 mg/dL — AB (ref 4.0–7.8)

## 2015-05-20 MED ORDER — HYDROCODONE-ACETAMINOPHEN 5-325 MG PO TABS: 1.0000 | ORAL_TABLET | Freq: Four times a day (QID) | ORAL | Status: DC | PRN

## 2015-05-20 MED ORDER — MELOXICAM 7.5 MG PO TABS: 7.5000 mg | ORAL_TABLET | Freq: Every day | ORAL | Status: DC

## 2015-05-20 NOTE — Progress Notes (Signed)
Pre visit review using our clinic review tool, if applicable. No additional management support is needed unless otherwise documented below in the visit note. 

## 2015-05-29 NOTE — Progress Notes (Signed)
Subjective:  Patient ID: Dominic Shaffer, male    DOB: 07/21/1940  Age: 75 y.o. MRN: [id number]  CC: No chief complaint on file.   HPI Dominic Shaffer presents for L ankle pai x 2-3 weeks - not better  Outpatient Prescriptions Prior to Visit  Medication Sig Dispense Refill  . amLODipine (NORVASC) 5 MG tablet Take 1 tablet (5 mg total) by mouth daily. 90 tablet 3  . aspirin EC 81 MG tablet Take 81 mg by mouth daily.    Marland Kitchen losartan-hydrochlorothiazide (HYZAAR) 100-25 MG per tablet Take 1 tablet by mouth daily. 90 tablet 3  . RAPAFLO 8 MG CAPS capsule     . terazosin (HYTRIN) 5 MG capsule Take 1 capsule (5 mg total) by mouth at bedtime. 90 capsule 3  . triamcinolone ointment (KENALOG) 0.5 % APPLY 1 APPLICATION TOPICALLY 2 TIMES DAILY 15 g 0  . HYDROcodone-acetaminophen (NORCO) 5-325 MG per tablet Take 1 tablet by mouth every 6 (six) hours as needed for moderate pain. 15 tablet 0  . meloxicam (MOBIC) 7.5 MG tablet Take 1-2 tablets (7.5-15 mg total) by mouth daily. 60 tablet 2   No facility-administered medications prior to visit.    ROS Review of Systems  Constitutional: Negative for appetite change, fatigue and unexpected weight change.  HENT: Negative for congestion, nosebleeds, sneezing, sore throat and trouble swallowing.   Eyes: Negative for itching and visual disturbance.  Respiratory: Negative for cough.   Cardiovascular: Negative for chest pain, palpitations and leg swelling.  Gastrointestinal: Negative for nausea, diarrhea, blood in stool and abdominal distention.  Genitourinary: Negative for frequency and hematuria.  Musculoskeletal: Positive for arthralgias and gait problem. Negative for back pain, joint swelling and neck pain.  Skin: Negative for rash.  Neurological: Negative for dizziness, tremors, speech difficulty and weakness.  Psychiatric/Behavioral: Negative for sleep disturbance, dysphoric mood and agitation. The patient is not nervous/anxious.      Objective:  BP 110/60 mmHg  Pulse 64  Wt 226 lb (102.513 kg)  SpO2 93%  BP Readings from Last 3 Encounters:  05/20/15 110/60  10/05/14 125/62  07/29/14 120/64    Wt Readings from Last 3 Encounters:  05/20/15 226 lb (102.513 kg)  10/05/14 229 lb (103.874 kg)  07/29/14 229 lb 3.2 oz (103.964 kg)    Physical Exam  Constitutional: He is oriented to person, place, and time. He appears well-developed. No distress.  NAD  HENT:  Mouth/Throat: Oropharynx is clear and moist.  Eyes: Conjunctivae are normal. Pupils are equal, round, and reactive to light.  Neck: Normal range of motion. No JVD present. No thyromegaly present.  Cardiovascular: Normal rate, regular rhythm, normal heart sounds and intact distal pulses.  Exam reveals no gallop and no friction rub.   No murmur heard. Pulmonary/Chest: Effort normal and breath sounds normal. No respiratory distress. He has no wheezes. He has no rales. He exhibits no tenderness.  Abdominal: Soft. Bowel sounds are normal. He exhibits no distension and no mass. There is no tenderness. There is no rebound and no guarding.  Musculoskeletal: Normal range of motion. He exhibits edema and tenderness.  Lymphadenopathy:    He has no cervical adenopathy.  Neurological: He is alert and oriented to person, place, and time. He has normal reflexes. No cranial nerve deficit. He exhibits normal muscle tone. He displays a negative Romberg sign. Coordination and gait normal.  Skin: Skin is warm and dry. No rash noted.  Psychiatric: He has a normal mood and affect. His behavior  is normal. Judgment and thought content normal.   L ankle is tender and swollen  Lab Results  Component Value Date   WBC 6.2 06/04/2014   HGB 13.4 06/04/2014   HCT 40.5 06/04/2014   PLT 224.0 06/04/2014   GLUCOSE 91 05/20/2015   CHOL 141 06/04/2014   TRIG 89.0 06/04/2014   HDL 32.20* 06/04/2014   LDLCALC 91 06/04/2014   ALT 12 06/04/2014   AST 13 06/04/2014   NA 139  05/20/2015   K 4.1 05/20/2015   CL 104 05/20/2015   CREATININE 1.17 05/20/2015   BUN 23 05/20/2015   CO2 28 05/20/2015   TSH 1.61 06/04/2014   PSA 1.70 04/29/2007   INR 1.02 12/28/2013    Dg Ankle Complete Left  05/20/2015  CLINICAL DATA:  Left ankle pain and swelling for 4 days. No known injury. EXAM: LEFT ANKLE COMPLETE - 3+ VIEW COMPARISON:  Tib-fib exam from 02/19/2007 FINDINGS: There is no evidence of fracture, dislocation, or joint effusion. There is no evidence of arthropathy or other focal bone abnormality. Soft tissues are unremarkable. IMPRESSION: Negative. Electronically Signed   By: Misty Stanley M.D.   On: 05/20/2015 15:42    Assessment & Plan:   Diagnoses and all orders for this visit:  Pain in joint, ankle and foot, left -     Basic metabolic panel; Future -     Uric acid; Future -     Sedimentation rate; Future -     DG Ankle Complete Left  Well adult exam  Other orders -     meloxicam (MOBIC) 7.5 MG tablet; Take 1-2 tablets (7.5-15 mg total) by mouth daily. -     HYDROcodone-acetaminophen (NORCO) 5-325 MG tablet; Take 1 tablet by mouth every 6 (six) hours as needed for moderate pain.   I have changed Dominic Shaffer's HYDROcodone-acetaminophen. I am also having him maintain his aspirin EC, amLODipine, losartan-hydrochlorothiazide, terazosin, triamcinolone ointment, RAPAFLO, and meloxicam.  Meds ordered this encounter  Medications  . meloxicam (MOBIC) 7.5 MG tablet    Sig: Take 1-2 tablets (7.5-15 mg total) by mouth daily.    Dispense:  60 tablet    Refill:  2  . HYDROcodone-acetaminophen (NORCO) 5-325 MG tablet    Sig: Take 1 tablet by mouth every 6 (six) hours as needed for moderate pain.    Dispense:  30 tablet    Refill:  0     Follow-up: No Follow-up on file.  Walker Kehr, MD

## 2015-06-10 ENCOUNTER — Other Ambulatory Visit: Payer: Self-pay | Admitting: Internal Medicine

## 2015-07-15 ENCOUNTER — Telehealth: Payer: Self-pay

## 2015-07-15 NOTE — Telephone Encounter (Signed)
Patient states he had a missed call from you. But i do not see any notes. Did you call? If so follow up, thank you

## 2015-07-15 NOTE — Telephone Encounter (Signed)
I have not called pt- I reviewed chart and I do not see where anyone has documented trying to contact him. I left a detailed mess advising of this.

## 2015-07-19 DIAGNOSIS — N401 Enlarged prostate with lower urinary tract symptoms: Secondary | ICD-10-CM | POA: Diagnosis not present

## 2015-07-19 DIAGNOSIS — Z8546 Personal history of malignant neoplasm of prostate: Secondary | ICD-10-CM | POA: Diagnosis not present

## 2015-07-19 DIAGNOSIS — R3912 Poor urinary stream: Secondary | ICD-10-CM | POA: Diagnosis not present

## 2015-07-19 DIAGNOSIS — Q6101 Congenital single renal cyst: Secondary | ICD-10-CM | POA: Diagnosis not present

## 2015-07-26 ENCOUNTER — Ambulatory Visit (INDEPENDENT_AMBULATORY_CARE_PROVIDER_SITE_OTHER): Payer: Medicare Other | Admitting: Internal Medicine

## 2015-07-26 ENCOUNTER — Encounter: Payer: Self-pay | Admitting: Internal Medicine

## 2015-07-26 ENCOUNTER — Other Ambulatory Visit (INDEPENDENT_AMBULATORY_CARE_PROVIDER_SITE_OTHER): Payer: Medicare Other

## 2015-07-26 VITALS — BP 140/70 | HR 67 | Temp 98.4°F | Ht 70.0 in | Wt 225.2 lb

## 2015-07-26 DIAGNOSIS — M79605 Pain in left leg: Secondary | ICD-10-CM

## 2015-07-26 DIAGNOSIS — I82411 Acute embolism and thrombosis of right femoral vein: Secondary | ICD-10-CM

## 2015-07-26 LAB — CBC WITH DIFFERENTIAL/PLATELET
BASOS ABS: 0 10*3/uL (ref 0.0–0.1)
Basophils Relative: 0.4 % (ref 0.0–3.0)
Eosinophils Absolute: 0.2 10*3/uL (ref 0.0–0.7)
Eosinophils Relative: 1.9 % (ref 0.0–5.0)
HEMATOCRIT: 38 % — AB (ref 39.0–52.0)
HEMOGLOBIN: 12.1 g/dL — AB (ref 13.0–17.0)
LYMPHS PCT: 18.9 % (ref 12.0–46.0)
Lymphs Abs: 2.1 10*3/uL (ref 0.7–4.0)
MCHC: 31.9 g/dL (ref 30.0–36.0)
MCV: 79.9 fl (ref 78.0–100.0)
MONOS PCT: 10.3 % (ref 3.0–12.0)
Monocytes Absolute: 1.2 10*3/uL — ABNORMAL HIGH (ref 0.1–1.0)
NEUTROS ABS: 7.8 10*3/uL — AB (ref 1.4–7.7)
Neutrophils Relative %: 68.5 % (ref 43.0–77.0)
PLATELETS: 212 10*3/uL (ref 150.0–400.0)
RBC: 4.76 Mil/uL (ref 4.22–5.81)
RDW: 14.7 % (ref 11.5–15.5)
WBC: 11.3 10*3/uL — ABNORMAL HIGH (ref 4.0–10.5)

## 2015-07-26 LAB — SEDIMENTATION RATE: Sed Rate: 9 mm/hr (ref 0–20)

## 2015-07-26 LAB — PROTIME-INR
INR: 1.3 ratio — ABNORMAL HIGH (ref 0.8–1.0)
PROTHROMBIN TIME: 13.2 s — AB (ref 9.6–13.1)

## 2015-07-26 LAB — BASIC METABOLIC PANEL
BUN: 25 mg/dL — ABNORMAL HIGH (ref 6–23)
CALCIUM: 9.1 mg/dL (ref 8.4–10.5)
CO2: 30 meq/L (ref 19–32)
Chloride: 102 mEq/L (ref 96–112)
Creatinine, Ser: 1.32 mg/dL (ref 0.40–1.50)
GFR: 68.31 mL/min (ref >60.00)
Glucose, Bld: 100 mg/dL — ABNORMAL HIGH (ref 70–99)
Potassium: 4.1 mEq/L (ref 3.5–5.1)
SODIUM: 136 meq/L (ref 135–145)

## 2015-07-26 MED ORDER — HYDROCODONE-ACETAMINOPHEN 5-325 MG PO TABS: 1.0000 | ORAL_TABLET | Freq: Four times a day (QID) | ORAL | Status: DC | PRN

## 2015-07-26 MED ORDER — RIVAROXABAN (XARELTO) VTE STARTER PACK (15 & 20 MG): ORAL_TABLET | ORAL | Status: DC

## 2015-07-26 NOTE — Progress Notes (Signed)
Subjective:  Patient ID: Dominic Shaffer, male    DOB: 17-Nov-1940  Age: 75 y.o. MRN: [id number]  CC: No chief complaint on file.   HPI Dominic Shaffer presents for c/o LLE pain and swelling 3 d ago. H/o DVT in 2012  Outpatient Prescriptions Prior to Visit  Medication Sig Dispense Refill  . amLODipine (NORVASC) 5 MG tablet TAKE ONE TABLET BY MOUTH ONCE DAILY 90 tablet 3  . aspirin EC 81 MG tablet Take 81 mg by mouth daily.    Marland Kitchen HYDROcodone-acetaminophen (NORCO) 5-325 MG tablet Take 1 tablet by mouth every 6 (six) hours as needed for moderate pain. 30 tablet 0  . losartan-hydrochlorothiazide (HYZAAR) 100-25 MG tablet TAKE ONE TABLET BY MOUTH ONCE DAILY 90 tablet 3  . meloxicam (MOBIC) 7.5 MG tablet Take 1-2 tablets (7.5-15 mg total) by mouth daily. 60 tablet 2  . triamcinolone ointment (KENALOG) 0.5 % APPLY 1 APPLICATION TOPICALLY 2 TIMES DAILY 15 g 0  . RAPAFLO 8 MG CAPS capsule     . terazosin (HYTRIN) 5 MG capsule Take 1 capsule (5 mg total) by mouth at bedtime. 90 capsule 3   No facility-administered medications prior to visit.    ROS Review of Systems  Constitutional: Negative for fatigue and unexpected weight change.  HENT: Negative for sore throat and trouble swallowing.   Eyes: Negative for itching and visual disturbance.  Respiratory: Negative for cough, shortness of breath and wheezing.   Cardiovascular: Positive for leg swelling. Negative for chest pain and palpitations.  Gastrointestinal: Negative for blood in stool.  Genitourinary: Negative for hematuria.  Musculoskeletal: Negative for back pain, joint swelling, gait problem and neck pain.  Skin: Negative for color change, rash and wound.  Neurological: Negative for dizziness and weakness.  Psychiatric/Behavioral: Negative for sleep disturbance, dysphoric mood and agitation. The patient is not nervous/anxious.     Objective:  BP 140/70 mmHg  Pulse 67  Temp(Src) 98.4 F (36.9 C) (Oral)  Ht 5\' 10"   (1.778 m)  Wt 225 lb 3 oz (102.144 kg)  BMI 32.31 kg/m2  SpO2 93%  BP Readings from Last 3 Encounters:  07/26/15 140/70  05/20/15 110/60  10/05/14 125/62    Wt Readings from Last 3 Encounters:  07/26/15 225 lb 3 oz (102.144 kg)  05/20/15 226 lb (102.513 kg)  10/05/14 229 lb (103.874 kg)    Physical Exam  Constitutional: No distress.  Cardiovascular: Exam reveals no gallop.   No murmur heard. Pulmonary/Chest: He has no wheezes.  Abdominal: There is no tenderness.  Musculoskeletal: He exhibits edema and tenderness.  Neurological: Coordination normal.  Skin: No rash noted. No erythema.  L calf is tender Edema trace to 1+ RLE - trace edema  Lab Results  Component Value Date   WBC 6.2 06/04/2014   HGB 13.4 06/04/2014   HCT 40.5 06/04/2014   PLT 224.0 06/04/2014   GLUCOSE 91 05/20/2015   CHOL 141 06/04/2014   TRIG 89.0 06/04/2014   HDL 32.20* 06/04/2014   LDLCALC 91 06/04/2014   ALT 12 06/04/2014   AST 13 06/04/2014   NA 139 05/20/2015   K 4.1 05/20/2015   CL 104 05/20/2015   CREATININE 1.17 05/20/2015   BUN 23 05/20/2015   CO2 28 05/20/2015   TSH 1.61 06/04/2014   PSA 1.70 04/29/2007   INR 1.02 12/28/2013    Dg Ankle Complete Left  05/20/2015  CLINICAL DATA:  Left ankle pain and swelling for 4 days. No known injury. EXAM: LEFT ANKLE  COMPLETE - 3+ VIEW COMPARISON:  Tib-fib exam from 02/19/2007 FINDINGS: There is no evidence of fracture, dislocation, or joint effusion. There is no evidence of arthropathy or other focal bone abnormality. Soft tissues are unremarkable. IMPRESSION: Negative. Electronically Signed   By: Misty Stanley M.D.   On: 05/20/2015 15:42    Assessment & Plan:   There are no diagnoses linked to this encounter. I have discontinued Mr. Kloth's RAPAFLO. I am also having him maintain his aspirin EC, triamcinolone ointment, meloxicam, HYDROcodone-acetaminophen, losartan-hydrochlorothiazide, amLODipine, and terazosin.  Meds ordered this  encounter  Medications  . terazosin (HYTRIN) 10 MG capsule    Sig:      Follow-up: No Follow-up on file.  Walker Kehr, MD

## 2015-07-26 NOTE — Progress Notes (Signed)
Pre visit review using our clinic review tool, if applicable. No additional management support is needed unless otherwise documented below in the visit note. 

## 2015-07-26 NOTE — Assessment & Plan Note (Addendum)
6/17 - RLE - new - r/o DVT D-dimer Empiric Xarelto starter pack Hold ASA, Meloxicam Norco prn

## 2015-07-26 NOTE — Assessment & Plan Note (Signed)
2012 RLE

## 2015-07-27 LAB — D-DIMER, QUANTITATIVE (NOT AT ARMC): D DIMER QUANT: 23.16 ug{FEU}/mL — AB (ref <0.50)

## 2015-07-27 NOTE — Addendum Note (Signed)
Addended by: Cassandria Anger on: 07/27/2015 08:05 AM   Modules accepted: Orders

## 2015-07-28 ENCOUNTER — Telehealth: Payer: Self-pay

## 2015-07-28 ENCOUNTER — Other Ambulatory Visit: Payer: Self-pay | Admitting: Internal Medicine

## 2015-07-28 ENCOUNTER — Telehealth: Payer: Self-pay | Admitting: *Deleted

## 2015-07-28 DIAGNOSIS — I82412 Acute embolism and thrombosis of left femoral vein: Secondary | ICD-10-CM | POA: Diagnosis not present

## 2015-07-28 DIAGNOSIS — I82432 Acute embolism and thrombosis of left popliteal vein: Secondary | ICD-10-CM | POA: Diagnosis not present

## 2015-07-28 DIAGNOSIS — I82442 Acute embolism and thrombosis of left tibial vein: Secondary | ICD-10-CM | POA: Diagnosis not present

## 2015-07-28 NOTE — Telephone Encounter (Signed)
Receive call report- [name] (L) Leg Pos for DVT Since Dr. Alain Marion is not in the office gave results to Dr. Sharlet Salina. Her response was to ask pt if he had started Xarelto started pack, if so ok to go home and continue Xarelto and f/u w/Dr. Plotniokov. Spoke w/patient he stated yes he started the Xarelto on yesterday. Made pt f/u appt for tomorrow since MD will be on vacation next week...Johny Chess

## 2015-07-28 NOTE — Telephone Encounter (Signed)
I called pt- I spoke to his wife who states he is not home. She wants to know when the LE venous doppler will be scheduled. I advised her our patient care coordinators will verify ins benefits and call them to schedule [name]. She is concerned. I have asked [facility] to work on this and call pt ASAP.

## 2015-07-28 NOTE — Telephone Encounter (Signed)
Can you give him a call. He has some questions about a mri scan he needs to have done? I dont see anything about it? Can you follow up.

## 2015-07-29 ENCOUNTER — Ambulatory Visit (INDEPENDENT_AMBULATORY_CARE_PROVIDER_SITE_OTHER): Payer: Medicare Other | Admitting: Internal Medicine

## 2015-07-29 ENCOUNTER — Encounter: Payer: Self-pay | Admitting: Internal Medicine

## 2015-07-29 VITALS — BP 120/58 | HR 65

## 2015-07-29 DIAGNOSIS — I82412 Acute embolism and thrombosis of left femoral vein: Secondary | ICD-10-CM

## 2015-07-29 DIAGNOSIS — I10 Essential (primary) hypertension: Secondary | ICD-10-CM

## 2015-07-29 DIAGNOSIS — M79605 Pain in left leg: Secondary | ICD-10-CM

## 2015-07-29 NOTE — Progress Notes (Signed)
Pre visit review using our clinic review tool, if applicable. No additional management support is needed unless otherwise documented below in the visit note. 

## 2015-07-29 NOTE — Assessment & Plan Note (Signed)
6/17 LLE - preliminary [name] Hematology consult Xarelto Rx

## 2015-07-29 NOTE — Progress Notes (Signed)
Subjective:  Patient ID: Dominic Shaffer, male    DOB: 1940/03/25  Age: 75 y.o. MRN: [id number]  CC: No chief complaint on file.   HPI Dominic Shaffer presents for a leg pain  And DVT in the LLE - feeling better. He had an US done - preliminary DVT   Outpatient Prescriptions Prior to Visit  Medication Sig Dispense Refill  . amLODipine (NORVASC) 5 MG tablet TAKE ONE TABLET BY MOUTH ONCE DAILY 90 tablet 3  . aspirin EC 81 MG tablet Take 81 mg by mouth daily.    Marland Kitchen HYDROcodone-acetaminophen (NORCO) 5-325 MG tablet Take 1 tablet by mouth every 6 (six) hours as needed for moderate pain. 60 tablet 0  . losartan-hydrochlorothiazide (HYZAAR) 100-25 MG tablet TAKE ONE TABLET BY MOUTH ONCE DAILY 90 tablet 3  . Rivaroxaban 15 & 20 MG TBPK Take as directed on package: Start with one 15mg  tablet by mouth twice a day with food. On Day 22, switch to one 20mg  tablet once a day with food. 51 each 0  . terazosin (HYTRIN) 10 MG capsule     . triamcinolone ointment (KENALOG) 0.5 % APPLY 1 APPLICATION TOPICALLY 2 TIMES DAILY 15 g 0   No facility-administered medications prior to visit.    ROS Review of Systems  Constitutional: Negative for appetite change, fatigue and unexpected weight change.  HENT: Negative for congestion, nosebleeds, sneezing, sore throat and trouble swallowing.   Eyes: Negative for itching and visual disturbance.  Respiratory: Negative for cough.   Cardiovascular: Negative for chest pain, palpitations and leg swelling.  Gastrointestinal: Negative for nausea, diarrhea, blood in stool and abdominal distention.  Genitourinary: Negative for frequency and hematuria.  Musculoskeletal: Negative for back pain, joint swelling, gait problem and neck pain.  Skin: Negative for rash.  Neurological: Negative for dizziness, tremors, speech difficulty and weakness.  Psychiatric/Behavioral: Negative for sleep disturbance, dysphoric mood and agitation. The patient is not nervous/anxious.       Objective:  BP 120/58 mmHg  Pulse 65  SpO2 97%  BP Readings from Last 3 Encounters:  07/29/15 120/58  07/26/15 140/70  05/20/15 110/60    Wt Readings from Last 3 Encounters:  07/26/15 225 lb 3 oz (102.144 kg)  05/20/15 226 lb (102.513 kg)  10/05/14 229 lb (103.874 kg)    Physical Exam  Constitutional: He is oriented to person, place, and time. He appears well-developed. No distress.  NAD  HENT:  Mouth/Throat: Oropharynx is clear and moist.  Eyes: Conjunctivae are normal. Pupils are equal, round, and reactive to light.  Neck: Normal range of motion. No JVD present. No thyromegaly present.  Cardiovascular: Normal rate, regular rhythm, normal heart sounds and intact distal pulses.  Exam reveals no gallop and no friction rub.   No murmur heard. Pulmonary/Chest: Effort normal and breath sounds normal. No respiratory distress. He has no wheezes. He has no rales. He exhibits no tenderness.  Abdominal: Soft. Bowel sounds are normal. He exhibits no distension and no mass. There is no tenderness. There is no rebound and no guarding.  Musculoskeletal: Normal range of motion. He exhibits edema and tenderness.  Lymphadenopathy:    He has no cervical adenopathy.  Neurological: He is alert and oriented to person, place, and time. He has normal reflexes. No cranial nerve deficit. He exhibits normal muscle tone. He displays a negative Romberg sign. Coordination and gait normal.  Skin: Skin is warm and dry. No rash noted.  Psychiatric: He has a normal mood and affect.  His behavior is normal. Judgment and thought content normal.  L calf NT, swelling is better    Lab Results  Component Value Date   WBC 11.3* 07/26/2015   HGB 12.1* 07/26/2015   HCT 38.0* 07/26/2015   PLT 212.0 07/26/2015   GLUCOSE 100* 07/26/2015   CHOL 141 06/04/2014   TRIG 89.0 06/04/2014   HDL 32.20* 06/04/2014   LDLCALC 91 06/04/2014   ALT 12 06/04/2014   AST 13 06/04/2014   NA 136 07/26/2015   K 4.1  07/26/2015   CL 102 07/26/2015   CREATININE 1.32 07/26/2015   BUN 25* 07/26/2015   CO2 30 07/26/2015   TSH 1.61 06/04/2014   PSA 1.70 04/29/2007   INR 1.3* 07/26/2015    Dg Ankle Complete Left  05/20/2015  CLINICAL DATA:  Left ankle pain and swelling for 4 days. No known injury. EXAM: LEFT ANKLE COMPLETE - 3+ VIEW COMPARISON:  Tib-fib exam from 02/19/2007 FINDINGS: There is no evidence of fracture, dislocation, or joint effusion. There is no evidence of arthropathy or other focal bone abnormality. Soft tissues are unremarkable. IMPRESSION: Negative. Electronically Signed   By: Misty Stanley M.D.   On: 05/20/2015 15:42    Assessment & Plan:   There are no diagnoses linked to this encounter. I have discontinued Mr. Tobia's meloxicam. I am also having him maintain his aspirin EC, triamcinolone ointment, losartan-hydrochlorothiazide, amLODipine, terazosin, Rivaroxaban, and HYDROcodone-acetaminophen.  Meds ordered this encounter  Medications  . DISCONTD: meloxicam (MOBIC) 7.5 MG tablet    Sig: as needed. Reported on 07/29/2015     Follow-up: No Follow-up on file.  Walker Kehr, MD

## 2015-07-29 NOTE — Assessment & Plan Note (Signed)
Losartan HCT, Amlodipine, Terazosyn 

## 2015-07-29 NOTE — Assessment & Plan Note (Signed)
6/17 LLE - preliminary [name] w/DVT Hematology consult Xarelto Rx

## 2015-07-29 NOTE — Telephone Encounter (Signed)
Agree. thanks

## 2015-07-30 ENCOUNTER — Other Ambulatory Visit: Payer: Self-pay

## 2015-07-30 ENCOUNTER — Encounter (HOSPITAL_COMMUNITY): Payer: Self-pay | Admitting: *Deleted

## 2015-07-30 ENCOUNTER — Encounter: Payer: Self-pay | Admitting: Internal Medicine

## 2015-07-30 ENCOUNTER — Encounter (HOSPITAL_COMMUNITY): Payer: Self-pay | Admitting: Emergency Medicine

## 2015-07-30 ENCOUNTER — Observation Stay (HOSPITAL_COMMUNITY)
Admission: EM | Admit: 2015-07-30 | Discharge: 2015-08-01 | Disposition: A | Discharge status: Home / Self Care | Payer: Medicare Other | Attending: Internal Medicine | Admitting: Internal Medicine

## 2015-07-30 DIAGNOSIS — D649 Anemia, unspecified: Secondary | ICD-10-CM | POA: Insufficient documentation

## 2015-07-30 DIAGNOSIS — I2510 Atherosclerotic heart disease of native coronary artery without angina pectoris: Secondary | ICD-10-CM | POA: Insufficient documentation

## 2015-07-30 DIAGNOSIS — K921 Melena: Principal | ICD-10-CM

## 2015-07-30 DIAGNOSIS — K922 Gastrointestinal hemorrhage, unspecified: Secondary | ICD-10-CM | POA: Diagnosis present

## 2015-07-30 DIAGNOSIS — I10 Essential (primary) hypertension: Secondary | ICD-10-CM | POA: Diagnosis present

## 2015-07-30 DIAGNOSIS — I447 Left bundle-branch block, unspecified: Secondary | ICD-10-CM | POA: Insufficient documentation

## 2015-07-30 DIAGNOSIS — I82402 Acute embolism and thrombosis of unspecified deep veins of left lower extremity: Secondary | ICD-10-CM | POA: Diagnosis not present

## 2015-07-30 DIAGNOSIS — I82409 Acute embolism and thrombosis of unspecified deep veins of unspecified lower extremity: Secondary | ICD-10-CM | POA: Diagnosis not present

## 2015-07-30 DIAGNOSIS — K5903 Drug induced constipation: Secondary | ICD-10-CM | POA: Diagnosis not present

## 2015-07-30 DIAGNOSIS — T402X5A Adverse effect of other opioids, initial encounter: Secondary | ICD-10-CM | POA: Diagnosis not present

## 2015-07-30 DIAGNOSIS — K5900 Constipation, unspecified: Secondary | ICD-10-CM | POA: Diagnosis not present

## 2015-07-30 DIAGNOSIS — Z87891 Personal history of nicotine dependence: Secondary | ICD-10-CM | POA: Insufficient documentation

## 2015-07-30 DIAGNOSIS — D500 Iron deficiency anemia secondary to blood loss (chronic): Secondary | ICD-10-CM | POA: Diagnosis not present

## 2015-07-30 DIAGNOSIS — Z79899 Other long term (current) drug therapy: Secondary | ICD-10-CM | POA: Insufficient documentation

## 2015-07-30 DIAGNOSIS — Z7901 Long term (current) use of anticoagulants: Secondary | ICD-10-CM | POA: Insufficient documentation

## 2015-07-30 DIAGNOSIS — K649 Unspecified hemorrhoids: Secondary | ICD-10-CM | POA: Diagnosis present

## 2015-07-30 DIAGNOSIS — K5901 Slow transit constipation: Secondary | ICD-10-CM

## 2015-07-30 DIAGNOSIS — K625 Hemorrhage of anus and rectum: Secondary | ICD-10-CM | POA: Diagnosis not present

## 2015-07-30 DIAGNOSIS — K644 Residual hemorrhoidal skin tags: Secondary | ICD-10-CM | POA: Diagnosis not present

## 2015-07-30 DIAGNOSIS — K648 Other hemorrhoids: Secondary | ICD-10-CM | POA: Insufficient documentation

## 2015-07-30 DIAGNOSIS — Z8546 Personal history of malignant neoplasm of prostate: Secondary | ICD-10-CM | POA: Insufficient documentation

## 2015-07-30 LAB — CBC
HEMATOCRIT: 35.2 % — AB (ref 39.0–52.0)
Hemoglobin: 11.6 g/dL — ABNORMAL LOW (ref 13.0–17.0)
MCH: 26.2 pg (ref 26.0–34.0)
MCHC: 33 g/dL (ref 30.0–36.0)
MCV: 79.6 fL (ref 78.0–100.0)
Platelets: 243 10*3/uL (ref 150–400)
RBC: 4.42 MIL/uL (ref 4.22–5.81)
RDW: 14.2 % (ref 11.5–15.5)
WBC: 10.2 10*3/uL (ref 4.0–10.5)

## 2015-07-30 LAB — COMPREHENSIVE METABOLIC PANEL
ALK PHOS: 66 U/L (ref 38–126)
ALT: 13 U/L — AB (ref 17–63)
ANION GAP: 7 (ref 5–15)
AST: 14 U/L — ABNORMAL LOW (ref 15–41)
Albumin: 3.3 g/dL — ABNORMAL LOW (ref 3.5–5.0)
BUN: 28 mg/dL — ABNORMAL HIGH (ref 6–20)
CALCIUM: 8.5 mg/dL — AB (ref 8.9–10.3)
CO2: 23 mmol/L (ref 22–32)
CREATININE: 1.22 mg/dL (ref 0.61–1.24)
Chloride: 107 mmol/L (ref 101–111)
GFR, EST NON AFRICAN AMERICAN: 57 mL/min — AB (ref >60)
Glucose, Bld: 107 mg/dL — ABNORMAL HIGH (ref 65–99)
Potassium: 3.8 mmol/L (ref 3.5–5.1)
SODIUM: 137 mmol/L (ref 135–145)
Total Bilirubin: 0.8 mg/dL (ref 0.3–1.2)
Total Protein: 6.9 g/dL (ref 6.5–8.1)

## 2015-07-30 LAB — CBC WITH DIFFERENTIAL/PLATELET
Basophils Absolute: 0 10*3/uL (ref 0.0–0.1)
Basophils Relative: 0 %
EOS ABS: 0.2 10*3/uL (ref 0.0–0.7)
EOS PCT: 2 %
HCT: 36.9 % — ABNORMAL LOW (ref 39.0–52.0)
HEMOGLOBIN: 11.9 g/dL — AB (ref 13.0–17.0)
LYMPHS ABS: 1.9 10*3/uL (ref 0.7–4.0)
LYMPHS PCT: 19 %
MCH: 25.7 pg — AB (ref 26.0–34.0)
MCHC: 32.2 g/dL (ref 30.0–36.0)
MCV: 79.7 fL (ref 78.0–100.0)
MONOS PCT: 7 %
Monocytes Absolute: 0.6 10*3/uL (ref 0.1–1.0)
Neutro Abs: 7.1 10*3/uL (ref 1.7–7.7)
Neutrophils Relative %: 72 %
PLATELETS: 255 10*3/uL (ref 150–400)
RBC: 4.63 MIL/uL (ref 4.22–5.81)
RDW: 14.4 % (ref 11.5–15.5)
WBC: 9.9 10*3/uL (ref 4.0–10.5)

## 2015-07-30 LAB — POC OCCULT BLOOD, ED: Fecal Occult Bld: POSITIVE — AB

## 2015-07-30 LAB — PROTIME-INR
INR: 2.58 — AB (ref 0.00–1.49)
PROTHROMBIN TIME: 26.5 s — AB (ref 11.6–15.2)

## 2015-07-30 MED ORDER — TERAZOSIN HCL 5 MG PO CAPS
10.0000 mg | ORAL_CAPSULE | Freq: Every day | ORAL | Status: DC
  Administered 2015-07-30 – 2015-07-31 (×2): 10 mg via ORAL
  Filled 2015-07-30 (×2): qty 2

## 2015-07-30 MED ORDER — SODIUM CHLORIDE 0.9 % IV SOLN
INTRAVENOUS | Status: AC
  Administered 2015-07-30: 19:00:00 via INTRAVENOUS

## 2015-07-30 MED ORDER — ONDANSETRON HCL 4 MG/2ML IJ SOLN: 4.0000 mg | Freq: Four times a day (QID) | INTRAMUSCULAR | Status: DC | PRN

## 2015-07-30 MED ORDER — SODIUM CHLORIDE 0.9 % IV BOLUS (SEPSIS)
1000.0000 mL | Freq: Once | INTRAVENOUS | Status: AC
  Administered 2015-07-30: 1000 mL via INTRAVENOUS

## 2015-07-30 MED ORDER — AMLODIPINE BESYLATE 5 MG PO TABS
5.0000 mg | ORAL_TABLET | Freq: Every day | ORAL | Status: DC
  Administered 2015-07-31 – 2015-08-01 (×2): 5 mg via ORAL
  Filled 2015-07-30 (×2): qty 1

## 2015-07-30 MED ORDER — ACETAMINOPHEN 650 MG RE SUPP: 650.0000 mg | Freq: Four times a day (QID) | RECTAL | Status: DC | PRN

## 2015-07-30 MED ORDER — HYDROCODONE-ACETAMINOPHEN 5-325 MG PO TABS
1.0000 | ORAL_TABLET | Freq: Four times a day (QID) | ORAL | Status: DC | PRN
  Administered 2015-07-31: 1 via ORAL
  Filled 2015-07-30: qty 1

## 2015-07-30 MED ORDER — SODIUM CHLORIDE 0.9 % IV SOLN: INTRAVENOUS | Status: DC

## 2015-07-30 MED ORDER — ACETAMINOPHEN 325 MG PO TABS: 650.0000 mg | ORAL_TABLET | Freq: Four times a day (QID) | ORAL | Status: DC | PRN

## 2015-07-30 MED ORDER — ONDANSETRON HCL 4 MG PO TABS: 4.0000 mg | ORAL_TABLET | Freq: Four times a day (QID) | ORAL | Status: DC | PRN

## 2015-07-30 NOTE — H&P (Addendum)
History and Physical    Dominic Shaffer [id number] DOB: April 02, 1940 DOA: 07/30/2015    PCP: Dominic Kehr, MD  Patient coming from: home  Chief Complaint: bloody stool  HPI: Dominic Shaffer is a 75 y.o. male with medical history significant of DVT in left leg (unprovoked) on Xarelto, h/o right leg DVT in the past, Hemorrhoids, Prostate cancer, BPH, ED, achalasia s/p dilatations and botox who presents with 2 episodes of bloody stool today. It was bright red blood mixed with stool with blood in the tissue when he wiped. He has been constipated from Hydrocodone that he has been taking recently. No abdominal pain or nausea. Not lightheaded. Last colonoscopy on 9/16 showed a singe polyp and diverticulosis. Never had bloody stool in the past.   ED Course:  Hb 11.9, BUN 28, Cr 1.22, FOB +, vitals stable, orthostatic vitals negative.   Review of Systems:  Has been having lumbar pain recently- sometimes this is in the center and sometimes on the sides- pain does not radiate down the legs.  All other systems reviewed and apart from HPI, are negative.  Past Medical History  Diagnosis Date  . Hypertension   . LBP (low back pain)   . Internal hemorrhoid 2003    Dr. Henrene Shaffer  . Prostate cancer ([place])   . Hematospermia   . Depression   . LBBB (left bundle branch block)   . History of DVT of lower extremity     06/ 2012  . BPH with urinary obstruction     hyperplasia  . Congenital renal cyst, single   . ED (erectile dysfunction)     secondary arterial insuffiency  . History of esophageal dilatation     and botox injection tx's at [facility]  . Esophageal achalasia     s/p multiple dilatations and botox injection tx and surgical intervention 06-17-2011  [facility] Myotomy  . Asymptomatic stenosis of left carotid artery without infarction     per duplex LICA [id number] ([id number])  . Nocturia   . Wears glasses   . At risk for sleep apnea     STOP-BANG= 4     SENT TO PCP 12-23-2013  . Coronary  artery disease     cardiologist--  dr Johnsie Cancel-- myoview 07-05-2010 poss. small inferior wall infact/  no ischemia/ ef 51%/  cardic CT score 28 and <50% LAD/D! disease  . Colon polyps   . Diverticulitis     Past Surgical History  Procedure Laterality Date  . Inguinal hernia repair Left 10/13/2012    Procedure: OPEN LEFT INGUINAL HERNIA REPAIR WITH ON Q PUMP;  Surgeon: Odis Hollingshead, MD;  Location: [facility];  Service: General;  Laterality: Left;  . Insertion of mesh Left 10/13/2012    Procedure: INSERTION OF MESH;  Surgeon: Odis Hollingshead, MD;  Location: [facility];  Service: General;  Laterality: Left;  . Laparoscopic heller myotomy  06-16-2001    w/ intraoperative upper endoscopy [place] (for achalasia)  . Umbilical hernia repair  11-11-2005  . Colonoscopy  last one 2009  . Cardiovascular stress test  07-05-2010  dr Johnsie Cancel    Adenosine nuclear study/  no significant ST segment change suggestive of ischemia/  possible small inferior wall infarct at mid and basal level/ ef 51%/  LBBB/  normal wall motion  . Transthoracic echocardiogram  07-05-2010    mild LVH/ septal motion consistent w/ LBBB/ ef 45-50%/  diffuse hypokinesis/  grade I diastolic dysfunction/ mild LAE/  trivial TR  . Radioactive  seed implant N/A 12/31/2013    Procedure: RADIOACTIVE SEED IMPLANT;  Surgeon: Malka So, MD;  Location: [facility];  Service: Urology;  Laterality: N/A;  seeds implanted 78 no seeds found in bladder    Social History:   reports that he quit smoking about 50 years ago. His smoking use included Cigarettes. He has a .125 pack-year smoking history. He has never used smokeless tobacco. He reports that he does not drink alcohol or use illicit drugs.  Allergies  Allergen Reactions  . Iodinated Diagnostic Agents Hives  . Shellfish Allergy Hives    MAINLY -SHRIMP    Family History  Problem Relation Age of Onset  . Hypertension Other   . Heart disease Mother 62    CAD  . Kidney  disease Mother   . Cancer Father 65    bladder ca; passed in 1999      Prior to Admission medications   Medication Sig Start Date End Date Taking? Authorizing Provider  amLODipine (NORVASC) 5 MG tablet TAKE ONE TABLET BY MOUTH ONCE DAILY 06/13/15   Cassandria Anger, MD  HYDROcodone-acetaminophen (NORCO) 5-325 MG tablet Take 1 tablet by mouth every 6 (six) hours as needed for moderate pain. 07/26/15   Cassandria Anger, MD  losartan-hydrochlorothiazide (HYZAAR) 100-25 MG tablet TAKE ONE TABLET BY MOUTH ONCE DAILY 06/13/15   Cassandria Anger, MD  Rivaroxaban 15 & 20 MG TBPK Take as directed on package: Start with one 15mg  tablet by mouth twice a day with food. On Day 22, switch to one 20mg  tablet once a day with food. 07/26/15   Cassandria Anger, MD  terazosin (HYTRIN) 10 MG capsule  07/23/15   Historical Provider, MD  triamcinolone ointment (KENALOG) 0.5 % APPLY 1 APPLICATION TOPICALLY 2 TIMES DAILY 08/20/14   Cassandria Anger, MD    Physical Exam: Filed Vitals:   07/30/15 1556 07/30/15 1559  BP: 140/62   Pulse: 81   Temp:  98.3 F (36.8 C)  TempSrc:  Oral  Resp: 16   SpO2: 99%       Constitutional: NAD, calm, comfortable Eyes: PERTLA, lids and conjunctivae normal ENMT: Mucous membranes are moist. Posterior pharynx clear of any exudate or lesions. Normal dentition.  Neck: normal, supple, no masses, no thyromegaly Respiratory: clear to auscultation bilaterally, no wheezing, no crackles. Normal respiratory effort. No accessory muscle use.  Cardiovascular: S1 & S2 heard, regular rate and rhythm, no murmurs / rubs / gallops.  . 2+ pedal pulses. No carotid bruits. - 1 + edema of left leg Abdomen: No distension, no tenderness, no masses palpated. No hepatosplenomegaly. Bowel sounds normal.  Musculoskeletal: no clubbing / cyanosis. No joint deformity upper and lower extremities. Good ROM, no contractures. Normal muscle tone.  Skin: no rashes, lesions, ulcers. No  induration Neurologic: CN 2-12 grossly intact. Sensation intact, DTR normal. Strength 5/5 in all 4 limbs.  Psychiatric: Normal judgment and insight. Alert and oriented x 3. Normal mood.     Labs on Admission: I have personally reviewed following labs and imaging studies  CBC:  Recent Labs Lab 07/26/15 1615 07/30/15 1610  WBC 11.3* 9.9  NEUTROABS 7.8* 7.1  HGB 12.1* 11.9*  HCT 38.0* 36.9*  MCV 79.9 79.7  PLT 212.0 123456   Basic Metabolic Panel:  Recent Labs Lab 07/26/15 1615 07/30/15 1610  NA 136 137  K 4.1 3.8  CL 102 107  CO2 30 23  GLUCOSE 100* 107*  BUN 25* 28*  CREATININE 1.32  1.22  CALCIUM 9.1 8.5*   GFR: Estimated Creatinine Clearance: 64.5 mL/min (by C-G formula based on Cr of 1.22). Liver Function Tests:  Recent Labs Lab 07/30/15 1610  AST 14*  ALT 13*  ALKPHOS 66  BILITOT 0.8  PROT 6.9  ALBUMIN 3.3*   No results for input(s): LIPASE, AMYLASE in the last 168 hours. No results for input(s): AMMONIA in the last 168 hours. Coagulation Profile:  Recent Labs Lab 07/26/15 1615 07/30/15 1610  INR 1.3* 2.58*   Cardiac Enzymes: No results for input(s): CKTOTAL, CKMB, CKMBINDEX, TROPONINI in the last 168 hours. BNP (last 3 results) No results for input(s): PROBNP in the last 8760 hours. HbA1C: No results for input(s): HGBA1C in the last 72 hours. CBG: No results for input(s): GLUCAP in the last 168 hours. Lipid Profile: No results for input(s): CHOL, HDL, LDLCALC, TRIG, CHOLHDL, LDLDIRECT in the last 72 hours. Thyroid Function Tests: No results for input(s): TSH, T4TOTAL, FREET4, T3FREE, THYROIDAB in the last 72 hours. Anemia Panel: No results for input(s): VITAMINB12, FOLATE, FERRITIN, TIBC, IRON, RETICCTPCT in the last 72 hours. Urine analysis:    Component Value Date/Time   COLORURINE YELLOW 06/04/2014 0823   APPEARANCEUR CLEAR 06/04/2014 0823   LABSPEC >=1.030* 06/04/2014 0823   PHURINE 6.0 06/04/2014 0823   GLUCOSEU NEGATIVE 06/04/2014  0823   HGBUR NEGATIVE 06/04/2014 0823   BILIRUBINUR NEGATIVE 06/04/2014 0823   KETONESUR NEGATIVE 06/04/2014 0823   UROBILINOGEN 0.2 06/04/2014 0823   NITRITE NEGATIVE 06/04/2014 0823   LEUKOCYTESUR NEGATIVE 06/04/2014 0823   Sepsis Labs: @LABRCNTIP (procalcitonin:4,lacticidven:4) )No results found for this or any previous visit (from the past 240 hour(s)).   Radiological Exams on Admission: No results found.  EKG: Independently reviewed. pending  Assessment/Plan Principal Problem:   Hematochezia / Hemorrhoids - possibly due to hemorrhoids- hold Xarelto- follow Hb every 12 hrs - GI called by ER - vitals stable  Active Problems:    Left leg DVT - unsure of which veins involved as imaging was done at [place] - hold Xarelto until source of bleed can be identified    Constipation - Miralax PRN     Essential hypertension - Norvasc - hold Hyzaar  DVT prophylaxis: SCDs  Code Status: Full code  Family Communication: wife  Disposition Plan: home in 1-2 days  Consults called: GI, Dr Benson Norway called by ER  Admission status: observation     [facility] MD [facility] Pager: [email or web address] Password [email or web address] 7PM-7AM, please contact night-coverage   07/30/2015, 5:52 PM

## 2015-07-30 NOTE — ED Notes (Signed)
[name] at bedside.

## 2015-07-30 NOTE — ED Notes (Signed)
Pt states starting Xarelto 4 days ago and this morning noticed blood in his stool. Pt states he has some mild abdominal discomfort.

## 2015-07-30 NOTE — ED Notes (Signed)
Spoke to RN -  CBC ordered 17:24 -  Last one resulted 16:43.  RN said too soon to draw another one.

## 2015-07-30 NOTE — ED Provider Notes (Signed)
CSN: [id number]     Arrival date & time 07/30/15  1546 History   First MD Initiated Contact with Patient 07/30/15 1552     Chief Complaint  Patient presents with  . Abdominal Pain  . Blood In Stools     (Consider location/radiation/quality/duration/timing/severity/associated sxs/prior Treatment) The history is provided by the patient.  ANTWAINE PUTNAM is a 75 y.o. male hx of HTN, prostate cancer, internal hemorrhoids, Here presenting with blood in his stool. Patient states that he was recently diagnosed with left leg DVT and was started on some route to 4 days ago. This morning she noticed the blood in his stools. States that the toilet bowl was red and he noticed some blood when he wipes. He had another episode this afternoon when he had another bowel movement. States that he has some rectal pain when he tried to have a bowel movement but that is very typical of his hemorrhoids. Denies any blood in his urine. Denies any fevers or vomiting. Had colonoscopy in 2009 that showed diverticulosis. Has seen Dr. Henrene Pastor previously.    Past Medical History  Diagnosis Date  . Hypertension   . LBP (low back pain)   . Internal hemorrhoid 2003    Dr. Henrene Pastor  . Prostate cancer ([place])   . Hematospermia   . Depression   . LBBB (left bundle branch block)   . History of DVT of lower extremity     06/ 2012  . BPH with urinary obstruction     hyperplasia  . Congenital renal cyst, single   . ED (erectile dysfunction)     secondary arterial insuffiency  . History of esophageal dilatation     and botox injection tx's at [facility]  . Esophageal achalasia     s/p multiple dilatations and botox injection tx and surgical intervention 06-17-2011  [facility] Myotomy  . Asymptomatic stenosis of left carotid artery without infarction     per duplex LICA [id number] ([id number])  . Nocturia   . Wears glasses   . At risk for sleep apnea     STOP-BANG= 4     SENT TO PCP 12-23-2013  . Coronary artery disease      cardiologist--  dr Johnsie Cancel-- myoview 07-05-2010 poss. small inferior wall infact/  no ischemia/ ef 51%/  cardic CT score 28 and <50% LAD/D! disease  . Colon polyps   . Diverticulitis    Past Surgical History  Procedure Laterality Date  . Inguinal hernia repair Left 10/13/2012    Procedure: OPEN LEFT INGUINAL HERNIA REPAIR WITH ON Q PUMP;  Surgeon: Odis Hollingshead, MD;  Location: [facility];  Service: General;  Laterality: Left;  . Insertion of mesh Left 10/13/2012    Procedure: INSERTION OF MESH;  Surgeon: Odis Hollingshead, MD;  Location: [facility];  Service: General;  Laterality: Left;  . Laparoscopic heller myotomy  06-16-2001    w/ intraoperative upper endoscopy [place] (for achalasia)  . Umbilical hernia repair  11-11-2005  . Colonoscopy  last one 2009  . Cardiovascular stress test  07-05-2010  dr Johnsie Cancel    Adenosine nuclear study/  no significant ST segment change suggestive of ischemia/  possible small inferior wall infarct at mid and basal level/ ef 51%/  LBBB/  normal wall motion  . Transthoracic echocardiogram  07-05-2010    mild LVH/ septal motion consistent w/ LBBB/ ef 45-50%/  diffuse hypokinesis/  grade I diastolic dysfunction/ mild LAE/  trivial TR  . Radioactive seed implant N/A 12/31/2013  Procedure: RADIOACTIVE SEED IMPLANT;  Surgeon: Malka So, MD;  Location: [facility];  Service: Urology;  Laterality: N/A;  seeds implanted 78 no seeds found in bladder   Family History  Problem Relation Age of Onset  . Hypertension Other   . Heart disease Mother 60    CAD  . Kidney disease Mother   . Cancer Father 49    bladder ca; passed in [place] History  Substance Use Topics  . Smoking status: Former Smoker -- 0.25 packs/day for .5 years    Types: Cigarettes    Quit date: 07/03/1965  . Smokeless tobacco: Never Used  . Alcohol Use: No    Review of Systems  Gastrointestinal: Positive for blood in stool and rectal pain.  All other systems reviewed and  are negative.     Allergies  Iodinated diagnostic agents and Shellfish allergy  Home Medications   Prior to Admission medications   Medication Sig Start Date End Date Taking? Authorizing Provider  amLODipine (NORVASC) 5 MG tablet TAKE ONE TABLET BY MOUTH ONCE DAILY 06/13/15   Cassandria Anger, MD  aspirin EC 81 MG tablet Take 81 mg by mouth daily.    Historical Provider, MD  HYDROcodone-acetaminophen (NORCO) 5-325 MG tablet Take 1 tablet by mouth every 6 (six) hours as needed for moderate pain. 07/26/15   Cassandria Anger, MD  losartan-hydrochlorothiazide (HYZAAR) 100-25 MG tablet TAKE ONE TABLET BY MOUTH ONCE DAILY 06/13/15   Cassandria Anger, MD  Rivaroxaban 15 & 20 MG TBPK Take as directed on package: Start with one 15mg  tablet by mouth twice a day with food. On Day 22, switch to one 20mg  tablet once a day with food. 07/26/15   Cassandria Anger, MD  terazosin (HYTRIN) 10 MG capsule  07/23/15   Historical Provider, MD  triamcinolone ointment (KENALOG) 0.5 % APPLY 1 APPLICATION TOPICALLY 2 TIMES DAILY 08/20/14   Evie Lacks Plotnikov, MD   BP 140/62 mmHg  Pulse 81  Temp(Src) 98.3 F (36.8 C) (Oral)  Resp 16  SpO2 99% Physical Exam  Constitutional: He is oriented to person, place, and time. He appears well-developed and well-nourished.  HENT:  Head: Normocephalic.  Mouth/Throat: Oropharynx is clear and moist.  Eyes: Conjunctivae are normal. Pupils are equal, round, and reactive to light.  Neck: Normal range of motion. Neck supple.  Cardiovascular: Normal rate, regular rhythm and normal heart sounds.   Pulmonary/Chest: Effort normal and breath sounds normal. No respiratory distress. He has no wheezes. He has no rales.  Abdominal: Soft. Bowel sounds are normal. He exhibits no distension. There is no tenderness. There is no rebound.  Genitourinary:  Some external hemorrhoids that is soft, not thrombosed. There is some dark stool with blood around it.   Musculoskeletal: Normal  range of motion.  L leg swollen with calf tenderness (recently diagnosed with L leg DVT)   Neurological: He is alert and oriented to person, place, and time. No cranial nerve deficit. Coordination normal.  Skin: Skin is warm and dry.  Psychiatric: He has a normal mood and affect. His behavior is normal. Judgment and thought content normal.  Nursing note and vitals reviewed.   ED Course  Procedures (including critical care time) Labs Review Labs Reviewed  CBC WITH DIFFERENTIAL/PLATELET - Abnormal; Notable for the following:    Hemoglobin 11.9 (*)    HCT 36.9 (*)    MCH 25.7 (*)    All other components within normal limits  COMPREHENSIVE  METABOLIC PANEL - Abnormal; Notable for the following:    Glucose, Bld 107 (*)    BUN 28 (*)    Calcium 8.5 (*)    Albumin 3.3 (*)    AST 14 (*)    ALT 13 (*)    GFR calc non Af Amer 57 (*)    All other components within normal limits  PROTIME-INR - Abnormal; Notable for the following:    Prothrombin Time 26.5 (*)    INR 2.58 (*)    All other components within normal limits  POC OCCULT BLOOD, ED - Abnormal; Notable for the following:    Fecal Occult Bld POSITIVE (*)    All other components within normal limits    Imaging Review No results found. I have personally reviewed and evaluated these images and lab results as part of my medical decision-making.   EKG Interpretation None      MDM   Final diagnoses:  None    ANANT GOODRIDGE is a 75 y.o. male here with rectal bleeding. Recently placed on xarelto for L leg DVT. Had two episodes of rectal bleeding already. Will get labs, guiac. Likely admit for observation given that he is on anticogulation.  5:11 PM Patient not orthostatic. Hg 11.9, baseline. Occ positive. INR 2.6. Called Dr. Benson Norway from GI, who will see patient as consult. Will admit to hospitalist for observation.     Wandra Arthurs, MD 07/30/15 [phone]

## 2015-07-30 NOTE — Consult Note (Signed)
Consult for [facility]  Reason for Consult: Hematochezia Referring Physician: [facility]  [name] HPI: This is a 75 year old male with a PMH of DVT in the RLE 06/2010 on Xarelto, LLE 07/28/2015, HTN, LBBB, history of prostate cancer, and achalasia admitted for hematochezia.  The patient states that he had 2 episodes of hematochezia today and both were with bowel movements.  He felt constipated and he did strain with the bowel movements precipitated the bleeding.  No further bleeding since his admission and he feels well.  Xarelto was started last Tuesday when he complained of LLE swelling and the ultrasound, per Dr. Alain Marion was positive for a DVT.  The plan is for him to see Hematology.  His last colonoscopy with Dr. Henrene Pastor was 09/2014 and it was significant for mild sigmoid diverticula.  No complaints of any abdominal pain, nausea, vomiting, hematemesis, or melena.  His HGB is essentially stable, but there is a mild drop.  Past Medical History  Diagnosis Date  . Hypertension   . LBP (low back pain)   . Internal hemorrhoid 2003    Dr. Henrene Pastor  . Prostate cancer ([place])   . Hematospermia   . Depression   . LBBB (left bundle branch block)   . History of DVT of lower extremity     06/ 2012  . BPH with urinary obstruction     hyperplasia  . Congenital renal cyst, single   . ED (erectile dysfunction)     secondary arterial insuffiency  . History of esophageal dilatation     and botox injection tx's at [facility]  . Esophageal achalasia     s/p multiple dilatations and botox injection tx and surgical intervention 06-17-2011  [facility] Myotomy  . Asymptomatic stenosis of left carotid artery without infarction     per duplex LICA [id number] ([id number])  . Nocturia   . Wears glasses   . At risk for sleep apnea     STOP-BANG= 4     SENT TO PCP 12-23-2013  . Coronary artery disease     cardiologist--  dr Johnsie Cancel-- myoview 07-05-2010 poss. small inferior wall infact/  no ischemia/ ef 51%/   cardic CT score 28 and <50% LAD/D! disease  . Colon polyps   . Diverticulitis     Past Surgical History  Procedure Laterality Date  . Inguinal hernia repair Left 10/13/2012    Procedure: OPEN LEFT INGUINAL HERNIA REPAIR WITH ON Q PUMP;  Surgeon: Odis Hollingshead, MD;  Location: [facility];  Service: General;  Laterality: Left;  . Insertion of mesh Left 10/13/2012    Procedure: INSERTION OF MESH;  Surgeon: Odis Hollingshead, MD;  Location: [facility];  Service: General;  Laterality: Left;  . Laparoscopic heller myotomy  06-16-2001    w/ intraoperative upper endoscopy [place] (for achalasia)  . Umbilical hernia repair  11-11-2005  . Colonoscopy  last one 2009  . Cardiovascular stress test  07-05-2010  dr Johnsie Cancel    Adenosine nuclear study/  no significant ST segment change suggestive of ischemia/  possible small inferior wall infarct at mid and basal level/ ef 51%/  LBBB/  normal wall motion  . Transthoracic echocardiogram  07-05-2010    mild LVH/ septal motion consistent w/ LBBB/ ef 45-50%/  diffuse hypokinesis/  grade I diastolic dysfunction/ mild LAE/  trivial TR  . Radioactive seed implant N/A 12/31/2013    Procedure: RADIOACTIVE SEED IMPLANT;  Surgeon: Malka So, MD;  Location: [facility];  Service: Urology;  Laterality: N/A;  seeds implanted 80 no seeds found in bladder    Family History  Problem Relation Age of Onset  . Hypertension Other   . Heart disease Mother 39    CAD  . Kidney disease Mother   . Cancer Father 76    bladder ca; passed in 1999    Social History:  reports that he quit smoking about 50 years ago. His smoking use included Cigarettes. He has a .125 pack-year smoking history. He has never used smokeless tobacco. He reports that he does not drink alcohol or use illicit drugs.  Allergies:  Allergies  Allergen Reactions  . Iodinated Diagnostic Agents Hives  . Shellfish Allergy Hives    MAINLY -SHRIMP    Medications:  Scheduled: . sodium  chloride   Intravenous STAT  . [START ON 07/31/2015] amLODipine  5 mg Oral Daily  . terazosin  10 mg Oral QHS   Continuous: . sodium chloride      Results for orders placed or performed during the hospital encounter of 07/30/15 (from the past 24 hour(s))  CBC with Differential     Status: Abnormal   Collection Time: 07/30/15  4:10 PM  Result Value Ref Range   WBC 9.9 4.0 - 10.5 K/uL   RBC 4.63 4.22 - 5.81 MIL/uL   Hemoglobin 11.9 (L) 13.0 - 17.0 g/dL   HCT 36.9 (L) 39.0 - 52.0 %   MCV 79.7 78.0 - 100.0 fL   MCH 25.7 (L) 26.0 - 34.0 pg   MCHC 32.2 30.0 - 36.0 g/dL   RDW 14.4 11.5 - 15.5 %   Platelets 255 150 - 400 K/uL   Neutrophils Relative % 72 %   Neutro Abs 7.1 1.7 - 7.7 K/uL   Lymphocytes Relative 19 %   Lymphs Abs 1.9 0.7 - 4.0 K/uL   Monocytes Relative 7 %   Monocytes Absolute 0.6 0.1 - 1.0 K/uL   Eosinophils Relative 2 %   Eosinophils Absolute 0.2 0.0 - 0.7 K/uL   Basophils Relative 0 %   Basophils Absolute 0.0 0.0 - 0.1 K/uL  Comprehensive metabolic panel     Status: Abnormal   Collection Time: 07/30/15  4:10 PM  Result Value Ref Range   Sodium 137 135 - 145 mmol/L   Potassium 3.8 3.5 - 5.1 mmol/L   Chloride 107 101 - 111 mmol/L   CO2 23 22 - 32 mmol/L   Glucose, Bld 107 (H) 65 - 99 mg/dL   BUN 28 (H) 6 - 20 mg/dL   Creatinine, Ser 1.22 0.61 - 1.24 mg/dL   Calcium 8.5 (L) 8.9 - 10.3 mg/dL   Total Protein 6.9 6.5 - 8.1 g/dL   Albumin 3.3 (L) 3.5 - 5.0 g/dL   AST 14 (L) 15 - 41 U/L   ALT 13 (L) 17 - 63 U/L   Alkaline Phosphatase 66 38 - 126 U/L   Total Bilirubin 0.8 0.3 - 1.2 mg/dL   GFR calc non Af Amer 57 (L) >60 mL/min   GFR calc Af Amer >60 >60 mL/min   Anion gap 7 5 - 15  Protime-INR     Status: Abnormal   Collection Time: 07/30/15  4:10 PM  Result Value Ref Range   Prothrombin Time 26.5 (H) 11.6 - 15.2 seconds   INR 2.58 (H) 0.00 - 1.49  POC occult blood, ED     Status: Abnormal   Collection Time: 07/30/15  4:10 PM  Result Value Ref Range  Fecal  Occult Bld POSITIVE (A) NEGATIVE  CBC     Status: Abnormal   Collection Time: 07/30/15  6:30 PM  Result Value Ref Range   WBC 10.2 4.0 - 10.5 K/uL   RBC 4.42 4.22 - 5.81 MIL/uL   Hemoglobin 11.6 (L) 13.0 - 17.0 g/dL   HCT 35.2 (L) 39.0 - 52.0 %   MCV 79.6 78.0 - 100.0 fL   MCH 26.2 26.0 - 34.0 pg   MCHC 33.0 30.0 - 36.0 g/dL   RDW 14.2 11.5 - 15.5 %   Platelets 243 150 - 400 K/uL     No results found.  ROS:  As stated above in the HPI otherwise negative.  Blood pressure 142/71, pulse 68, temperature 98.2 F (36.8 C), temperature source Oral, resp. rate 18, height 5\' 10"  (1.778 m), weight 100.9 kg (222 lb 7.1 oz), SpO2 100 %.    PE: Gen: NAD, Alert and Oriented HEENT:  [place] 6/AT, EOMI Neck: Supple, no LAD Lungs: CTA Bilaterally CV: RRR without M/G/R ABM: Soft, NTND, +BS Ext: No C/C/E  Assessment/Plan: 1) Hematochezia. 2) History of DVT on Xarelto. 3) Constipation.   I think he his straining precipitated the bleeding and it was exacerbated by Xarelto.  I agree with hold the anticoagulant at this time, but if he does not have any further bleeding I would recommend that he restart the medication.  It is a high morbidity with a PE than with hemorrhoidal or even diverticular bleeding.  If the bleeding recurs with resumption of Xarelto a FFS will be in order to help evaluate the issue.  Plan: 1) Follow HGB. 2) If no further bleeding by tomorrow AM he can resume Xarelto.     Danilyn Cocke D 07/30/2015, 8:29 PM

## 2015-07-31 DIAGNOSIS — K649 Unspecified hemorrhoids: Secondary | ICD-10-CM | POA: Diagnosis not present

## 2015-07-31 DIAGNOSIS — K625 Hemorrhage of anus and rectum: Secondary | ICD-10-CM | POA: Diagnosis not present

## 2015-07-31 DIAGNOSIS — I10 Essential (primary) hypertension: Secondary | ICD-10-CM | POA: Diagnosis not present

## 2015-07-31 DIAGNOSIS — K921 Melena: Secondary | ICD-10-CM | POA: Diagnosis not present

## 2015-07-31 DIAGNOSIS — K5900 Constipation, unspecified: Secondary | ICD-10-CM | POA: Diagnosis not present

## 2015-07-31 DIAGNOSIS — I82409 Acute embolism and thrombosis of unspecified deep veins of unspecified lower extremity: Secondary | ICD-10-CM | POA: Diagnosis not present

## 2015-07-31 DIAGNOSIS — D500 Iron deficiency anemia secondary to blood loss (chronic): Secondary | ICD-10-CM | POA: Diagnosis not present

## 2015-07-31 DIAGNOSIS — K5901 Slow transit constipation: Secondary | ICD-10-CM | POA: Diagnosis not present

## 2015-07-31 LAB — CBC
HCT: 33.9 % — ABNORMAL LOW (ref 39.0–52.0)
Hemoglobin: 11 g/dL — ABNORMAL LOW (ref 13.0–17.0)
MCH: 25.9 pg — ABNORMAL LOW (ref 26.0–34.0)
MCHC: 32.4 g/dL (ref 30.0–36.0)
MCV: 80 fL (ref 78.0–100.0)
PLATELETS: 230 10*3/uL (ref 150–400)
RBC: 4.24 MIL/uL (ref 4.22–5.81)
RDW: 14.3 % (ref 11.5–15.5)
WBC: 7 10*3/uL (ref 4.0–10.5)

## 2015-07-31 LAB — BASIC METABOLIC PANEL
ANION GAP: 3 — AB (ref 5–15)
BUN: 19 mg/dL (ref 6–20)
CALCIUM: 8.1 mg/dL — AB (ref 8.9–10.3)
CO2: 24 mmol/L (ref 22–32)
CREATININE: 1.08 mg/dL (ref 0.61–1.24)
Chloride: 112 mmol/L — ABNORMAL HIGH (ref 101–111)
Glucose, Bld: 97 mg/dL (ref 65–99)
Potassium: 4 mmol/L (ref 3.5–5.1)
Sodium: 139 mmol/L (ref 135–145)

## 2015-07-31 MED ORDER — RIVAROXABAN 15 MG PO TABS
15.0000 mg | ORAL_TABLET | Freq: Two times a day (BID) | ORAL | Status: DC
Start: 2015-07-31 — End: 2015-08-01
  Administered 2015-07-31 – 2015-08-01 (×2): 15 mg via ORAL
  Filled 2015-07-31 (×2): qty 1

## 2015-07-31 MED ORDER — HYDROCORTISONE 2.5 % RE CREA
TOPICAL_CREAM | Freq: Three times a day (TID) | RECTAL | Status: DC
  Administered 2015-07-31: 1 via RECTAL
  Administered 2015-07-31 – 2015-08-01 (×2): via RECTAL
  Filled 2015-07-31: qty 28.35

## 2015-07-31 MED ORDER — HYDROCORTISONE ACETATE 25 MG RE SUPP
25.0000 mg | Freq: Two times a day (BID) | RECTAL | Status: DC
  Administered 2015-07-31 – 2015-08-01 (×3): 25 mg via RECTAL
  Filled 2015-07-31 (×3): qty 1

## 2015-07-31 NOTE — Progress Notes (Signed)
Subjective: No further bleeding.    Objective: Vital signs in last 24 hours: Temp:  [97.9 F (36.6 C)-98.3 F (36.8 C)] 98.3 F (36.8 C) (07/02 0554) Pulse Rate:  [57-81] 58 (07/02 0554) Resp:  [16-18] 16 (07/02 0554) BP: (113-142)/(54-71) 113/54 mmHg (07/02 0554) SpO2:  [96 %-100 %] 96 % (07/02 0554) Weight:  [100.9 kg (222 lb 7.1 oz)] 100.9 kg (222 lb 7.1 oz) (07/01 1832) Last BM Date: 07/30/15  Intake/Output from previous day: 07/01 0701 - 07/02 0700 In: 500 [I.V.:500] Out: 1401 [Urine:1400; Stool:1] Intake/Output this shift: Total I/O In: 240 [P.O.:240] Out: 350 [Urine:350]  General appearance: alert and no distress GI: soft, non-tender; bowel sounds normal; no masses,  no organomegaly  Lab Results:  Recent Labs  07/30/15 1610 07/30/15 1830 07/31/15 0503  WBC 9.9 10.2 7.0  HGB 11.9* 11.6* 11.0*  HCT 36.9* 35.2* 33.9*  PLT 255 243 230   BMET  Recent Labs  07/30/15 1610 07/31/15 0503  NA 137 139  K 3.8 4.0  CL 107 112*  CO2 23 24  GLUCOSE 107* 97  BUN 28* 19  CREATININE 1.22 1.08  CALCIUM 8.5* 8.1*   LFT  Recent Labs  07/30/15 1610  PROT 6.9  ALBUMIN 3.3*  AST 14*  ALT 13*  ALKPHOS 66  BILITOT 0.8   PT/INR  Recent Labs  07/30/15 1610  LABPROT 26.5*  INR 2.58*   Hepatitis Panel No results for input(s): HEPBSAG, HCVAB, HEPAIGM, HEPBIGM in the last 72 hours. C-Diff No results for input(s): CDIFFTOX in the last 72 hours. Fecal Lactopherrin No results for input(s): FECLLACTOFRN in the last 72 hours.  Studies/Results: No results found.  Medications:  Scheduled: . amLODipine  5 mg Oral Daily  . terazosin  10 mg Oral QHS   Continuous: . sodium chloride      Assessment/Plan: 1) Hematochezia. 2) Anemia - Mild drop in HGB. 3) LLE DVT.   The patient denies any further bleeding.  Down trend in his HGB.  I am suspicious that he has a hemorrhoidal source, but I cannot be certain at this time.  The rectal examination did reveal a  streak of blood on the examination glove.  No evidence of an anal fissure.  I am recommended that he be restarted on his Xarelto as he has the DVT and monitor him for hematochezia over the next 24 hours.  If he bleeds and/or continues to have a down trend in his HGB a FFS can be performed, but the decision will ultimately be up to Dr. Henrene Pastor.  He is to take over in the AM.  The risks off of anticoagulation are higher than his bleeding risks at this time.  The patient and wife agree with the plan.     Stanislawa Gaffin D 07/31/2015, 9:25 AM

## 2015-07-31 NOTE — Progress Notes (Signed)
PROGRESS NOTE    Dominic Shaffer  [id number] DOB: 1940-04-16 DOA: 07/30/2015  PCP: Walker Kehr, MD   Brief Narrative:  Dominic Shaffer is a 75 y.o. male with medical history significant of DVT in left leg (unprovoked) on Xarelto, h/o right leg DVT in the past, Hemorrhoids, Prostate cancer, BPH, ED, achalasia s/p dilatations and botox who presents with 2 episodes of bloody stool today. It was bright red blood mixed with stool with blood in the tissue when he wiped. He has been constipated from Hydrocodone that he has been taking recently. No abdominal pain or nausea. Not lightheaded. Last colonoscopy on 9/16 showed a singe polyp and diverticulosis. Never had bloody stool in the past.   Subjective: Had blood on toilet paper when he wiped but no blood on the toilet with BM this AM. No abdominal pain or nausea.    Assessment & Plan:   Principal Problem:   Hematochezia/ Hemorrhoids/ constipation  - start Anusol- if no further bleeding, start Xarelto tonight - has stopped Vicodin  Active Problems:   Essential hypertension -BP slightly low - holding meds     Left leg DVT ( - acute with mild edema - holding Xarelto     DVT prophylaxis: SCDs Code Status: Full code Family Communication: wife Disposition Plan: home tomorrow Consultants:   GI Procedures:   none Antimicrobials:  Anti-infectives    None       Objective: Filed Vitals:   07/30/15 1832 07/30/15 1901 07/30/15 2121 07/31/15 0554  BP: 142/71 142/71 116/58 113/54  Pulse: 66 68 57 58  Temp: 98.2 F (36.8 C) 98.2 F (36.8 C) 97.9 F (36.6 C) 98.3 F (36.8 C)  TempSrc: Oral Oral Oral Oral  Resp: 18 18 18 16   Height: 5\' 10"  (1.778 m)     Weight: 100.9 kg (222 lb 7.1 oz)     SpO2: 99% 100% 98% 96%    Intake/Output Summary (Last 24 hours) at 07/31/15 1320 Last data filed at 07/31/15 0900  Gross per 24 hour  Intake    740 ml  Output   1751 ml  Net  -1011 ml   Filed Weights   07/30/15 1832    Weight: 100.9 kg (222 lb 7.1 oz)    Examination: General exam: Appears comfortable  HEENT: PERRLA, oral mucosa moist, no sclera icterus or thrush Respiratory system: Clear to auscultation. Respiratory effort normal. Cardiovascular system: S1 & S2 heard, RRR.  No murmurs  Gastrointestinal system: Abdomen soft, non-tender, nondistended. Normal bowel sound. No organomegaly Central nervous system: Alert and oriented. No focal neurological deficits. Extremities: No cyanosis, clubbing or edema Skin: No rashes or ulcers Psychiatry:  Mood & affect appropriate.     Data Reviewed: I have personally reviewed following labs and imaging studies  CBC:  Recent Labs Lab 07/26/15 1615 07/30/15 1610 07/30/15 1830 07/31/15 0503  WBC 11.3* 9.9 10.2 7.0  NEUTROABS 7.8* 7.1  --   --   HGB 12.1* 11.9* 11.6* 11.0*  HCT 38.0* 36.9* 35.2* 33.9*  MCV 79.9 79.7 79.6 80.0  PLT 212.0 255 243 [id number]   Basic Metabolic Panel:  Recent Labs Lab 07/26/15 1615 07/30/15 1610 07/31/15 0503  NA 136 137 139  K 4.1 3.8 4.0  CL 102 107 112*  CO2 30 23 24   GLUCOSE 100* 107* 97  BUN 25* 28* 19  CREATININE 1.32 1.22 1.08  CALCIUM 9.1 8.5* 8.1*   GFR: Estimated Creatinine Clearance: 72.5 mL/min (by C-G formula based on Cr of  1.08). Liver Function Tests:  Recent Labs Lab 07/30/15 1610  AST 14*  ALT 13*  ALKPHOS 66  BILITOT 0.8  PROT 6.9  ALBUMIN 3.3*   No results for input(s): LIPASE, AMYLASE in the last 168 hours. No results for input(s): AMMONIA in the last 168 hours. Coagulation Profile:  Recent Labs Lab 07/26/15 1615 07/30/15 1610  INR 1.3* 2.58*   Cardiac Enzymes: No results for input(s): CKTOTAL, CKMB, CKMBINDEX, TROPONINI in the last 168 hours. BNP (last 3 results) No results for input(s): PROBNP in the last 8760 hours. HbA1C: No results for input(s): HGBA1C in the last 72 hours. CBG: No results for input(s): GLUCAP in the last 168 hours. Lipid Profile: No results for  input(s): CHOL, HDL, LDLCALC, TRIG, CHOLHDL, LDLDIRECT in the last 72 hours. Thyroid Function Tests: No results for input(s): TSH, T4TOTAL, FREET4, T3FREE, THYROIDAB in the last 72 hours. Anemia Panel: No results for input(s): VITAMINB12, FOLATE, FERRITIN, TIBC, IRON, RETICCTPCT in the last 72 hours. Urine analysis:    Component Value Date/Time   COLORURINE YELLOW 06/04/2014 0823   APPEARANCEUR CLEAR 06/04/2014 0823   LABSPEC >=1.030* 06/04/2014 0823   PHURINE 6.0 06/04/2014 0823   GLUCOSEU NEGATIVE 06/04/2014 0823   HGBUR NEGATIVE 06/04/2014 0823   BILIRUBINUR NEGATIVE 06/04/2014 0823   KETONESUR NEGATIVE 06/04/2014 0823   UROBILINOGEN 0.2 06/04/2014 0823   NITRITE NEGATIVE 06/04/2014 0823   LEUKOCYTESUR NEGATIVE 06/04/2014 0823   Sepsis Labs: @LABRCNTIP (procalcitonin:4,lacticidven:4) )No results found for this or any previous visit (from the past 240 hour(s)).       Radiology Studies: No results found.    Scheduled Meds: . amLODipine  5 mg Oral Daily  . hydrocortisone   Rectal TID  . hydrocortisone  25 mg Rectal BID  . terazosin  10 mg Oral QHS   Continuous Infusions: . sodium chloride          Time spent in minutes: 35 min    Deema Juncaj, MD [facility] Pager: [email or web address] Password [email or web address] 07/31/2015, 1:20 PM

## 2015-08-01 ENCOUNTER — Other Ambulatory Visit: Payer: Self-pay

## 2015-08-01 DIAGNOSIS — I10 Essential (primary) hypertension: Secondary | ICD-10-CM | POA: Diagnosis not present

## 2015-08-01 DIAGNOSIS — K921 Melena: Secondary | ICD-10-CM | POA: Diagnosis not present

## 2015-08-01 DIAGNOSIS — K5901 Slow transit constipation: Secondary | ICD-10-CM | POA: Diagnosis not present

## 2015-08-01 DIAGNOSIS — K625 Hemorrhage of anus and rectum: Secondary | ICD-10-CM

## 2015-08-01 DIAGNOSIS — K5909 Other constipation: Secondary | ICD-10-CM | POA: Diagnosis not present

## 2015-08-01 DIAGNOSIS — K649 Unspecified hemorrhoids: Secondary | ICD-10-CM | POA: Diagnosis not present

## 2015-08-01 LAB — CBC
HCT: 36.1 % — ABNORMAL LOW (ref 39.0–52.0)
Hemoglobin: 11.7 g/dL — ABNORMAL LOW (ref 13.0–17.0)
MCH: 26 pg (ref 26.0–34.0)
MCHC: 32.4 g/dL (ref 30.0–36.0)
MCV: 80.2 fL (ref 78.0–100.0)
PLATELETS: 247 10*3/uL (ref 150–400)
RBC: 4.5 MIL/uL (ref 4.22–5.81)
RDW: 14.3 % (ref 11.5–15.5)
WBC: 7.4 10*3/uL (ref 4.0–10.5)

## 2015-08-01 MED ORDER — POLYETHYLENE GLYCOL 3350 17 GM/SCOOP PO POWD: 1.0000 | Freq: Every day | ORAL | Status: DC | PRN

## 2015-08-01 MED ORDER — BISACODYL 5 MG PO TBEC: 5.0000 mg | DELAYED_RELEASE_TABLET | Freq: Every day | ORAL | Status: DC | PRN

## 2015-08-01 MED ORDER — SENNA 8.6 MG PO TABS: 1.0000 | ORAL_TABLET | Freq: Every day | ORAL | Status: DC | PRN

## 2015-08-01 MED ORDER — TRIAMCINOLONE ACETONIDE 0.025 % EX OINT: TOPICAL_OINTMENT | Freq: Three times a day (TID) | CUTANEOUS | Status: DC

## 2015-08-01 MED ORDER — BISACODYL 10 MG RE SUPP: 10.0000 mg | RECTAL | Status: DC | PRN

## 2015-08-01 NOTE — Progress Notes (Signed)
    Progress Note   Subjective  Feels fine, no bleeding- normal brown stool this am with no blood- no abdominal or rectal discomfort HGB 11.7 ,better  Objective   Vital signs in last 24 hours: Temp:  [98 F (36.7 C)-98.4 F (36.9 C)] 98.4 F (36.9 C) (07/03 0525) Pulse Rate:  [60-70] 61 (07/03 0525) Resp:  [16-18] 18 (07/03 0525) BP: (112-131)/(45-63) 116/56 mmHg (07/03 0525) SpO2:  [97 %-98 %] 97 % (07/03 0525) Last BM Date: 08/01/15 General:   AA male in NAD Heart:  Regular rate and rhythm; no murmurs Lungs: Respirations even and unlabored, lungs CTA bilaterally Abdomen:  Soft, nontender and nondistended. Normal bowel sounds. Extremities:  Without edema. Neurologic:  Alert and oriented,  grossly normal neurologically. Psych:  Cooperative. Normal mood and affect.  Intake/Output from previous day: 07/02 0701 - 07/03 0700 In: 2040 [P.O.:2040] Out: 1250 [Urine:1250] Intake/Output this shift:    Lab Results:  Recent Labs  07/30/15 1830 07/31/15 0503 08/01/15 0444  WBC 10.2 7.0 7.4  HGB 11.6* 11.0* 11.7*  HCT 35.2* 33.9* 36.1*  PLT 243 230 247   BMET  Recent Labs  07/30/15 1610 07/31/15 0503  NA 137 139  K 3.8 4.0  CL 107 112*  CO2 23 24  GLUCOSE 107* 97  BUN 28* 19  CREATININE 1.22 1.08  CALCIUM 8.5* 8.1*   LFT  Recent Labs  07/30/15 1610  PROT 6.9  ALBUMIN 3.3*  AST 14*  ALT 13*  ALKPHOS 66  BILITOT 0.8   PT/INR  Recent Labs  07/30/15 1610  LABPROT 26.5*  INR 2.58*    Studies/Results: No results found.     Assessment / Plan:    #1 75 yo male with hematochezia - On Xarelto for DVT- no bleeding in over 24 hours and hgb trending up This is likely secondary to internal hemorrhoid  Ok for discharge today   please discharge on Anusol HC supp qhs x 7 days then as needed Will have him come to office lab for CBC next week Follow up with Dr Henrene Pastor as needed  Principal Problem:   Hematochezia Active Problems:   Essential  hypertension   Hemorrhoids   Left leg DVT ([place])   Constipation       Amy Esterwood  08/01/2015, 9:07 AM   GI ATTENDING  Agree with interval data review, progress note, and plans. [facility] can follow up with me as needed.  Docia Chuck. Geri Seminole., M.D. [facility] Division of Gastroenterology

## 2015-08-01 NOTE — Discharge Summary (Signed)
Physician Discharge Summary  Dominic Shaffer [id number] DOB: [birth year] DOA: 07/30/2015  PCP: Walker Kehr, MD  Admit date: 07/30/2015 Discharge date: 08/01/2015  Admitted From: home Disposition:  home   Recommendations for Outpatient Follow-up:  1. Prevent constipation  Home Health:  none  Equipment/Devices:  none    Discharge Condition:  stable   CODE STATUS:  Full code   Diet recommendation:  Heart healthy  Consultations:  GI    Discharge Diagnoses:  Principal Problem:   Hematochezia Active Problems:   Hemorrhoids   Left leg DVT ([place])   Constipation   Essential hypertension    Subjective: No further bleeding since yesterday AM.   Brief Summary: Dominic Shaffer is a 75 y.o. male with medical history significant of DVT in left leg (unprovoked) on Xarelto, h/o right leg DVT in the past, Hemorrhoids, Prostate cancer, BPH, ED, achalasia s/p dilatations and botox who presents with 2 episodes of bloody stool today. It was bright red blood mixed with stool with blood in the tissue when he wiped. He has been constipated from Hydrocodone that he has been taking recently. No abdominal pain or nausea. Not lightheaded. Last colonoscopy on 9/16 showed a singe polyp and diverticulosis. Never had bloody stool in the past.   Hospital Course:  Principal Problem:  Hematochezia/ Hemorrhoids/ constipation  - start Anusol- if no further bleeding, started Xarelto last night- no resultant bleeding yet - has stopped Vicodin  Active Problems:  Essential hypertension -BP slightly low - held meds while in the hospital    Left leg DVT  - acute with mild edema - resume Xarelto   Discharge Instructions      Discharge Instructions    Diet - low sodium heart healthy    Complete by:  As directed      Discharge instructions    Complete by:  As directed   Can use Hydrocortisone 2.5% TID- your insurance is not paying for this and you can obtain it over the counter if you  cannot find Triamolone     Increase activity slowly    Complete by:  As directed             Medication List    TAKE these medications        amLODipine 5 MG tablet  Commonly known as:  NORVASC  TAKE ONE TABLET BY MOUTH ONCE DAILY     bisacodyl 5 MG EC tablet  Commonly known as:  DULCOLAX  Take 1 tablet (5 mg total) by mouth daily as needed for moderate constipation.     bisacodyl 10 MG suppository  Commonly known as:  DULCOLAX  Place 1 suppository (10 mg total) rectally as needed for moderate constipation.     HYDROcodone-acetaminophen 5-325 MG tablet  Commonly known as:  NORCO  Take 1 tablet by mouth every 6 (six) hours as needed for moderate pain.     losartan-hydrochlorothiazide 100-25 MG tablet  Commonly known as:  HYZAAR  TAKE ONE TABLET BY MOUTH ONCE DAILY     polyethylene glycol powder powder  Commonly known as:  GLYCOLAX/MIRALAX  Take 255 g by mouth daily as needed.     Rivaroxaban 15 & 20 MG Tbpk  Take as directed on package: Start with one 15mg  tablet by mouth twice a day with food. On Day 22, switch to one 20mg  tablet once a day with food.     senna 8.6 MG Tabs tablet  Commonly known as:  SENOKOT  Take 1 tablet (  8.6 mg total) by mouth daily as needed for mild constipation.     terazosin 10 MG capsule  Commonly known as:  HYTRIN  Take 10 mg by mouth at bedtime.     triamcinolone ointment 0.5 %  Commonly known as:  KENALOG  APPLY 1 APPLICATION TOPICALLY 2 TIMES DAILY     triamcinolone 0.025 % ointment  Commonly known as:  KENALOG  Apply topically 3 (three) times daily.        Allergies  Allergen Reactions  . Iodinated Diagnostic Agents Hives  . Shellfish Allergy Hives    MAINLY -SHRIMP     Procedures/Studies:   No results found.     Discharge Exam: Filed Vitals:   07/31/15 2105 08/01/15 0525  BP: 112/45 116/56  Pulse: 60 61  Temp: 98 F (36.7 C) 98.4 F (36.9 C)  Resp: 18 18   Filed Vitals:   07/31/15 0554 07/31/15 1354  07/31/15 2105 08/01/15 0525  BP: 113/54 131/63 112/45 116/56  Pulse: 58 70 60 61  Temp: 98.3 F (36.8 C) 98.2 F (36.8 C) 98 F (36.7 C) 98.4 F (36.9 C)  TempSrc: Oral Oral Oral Oral  Resp: 16 16 18 18   Height:      Weight:      SpO2: 96% 98% 97% 97%    General: Pt is alert, awake, not in acute distress Cardiovascular: RRR, S1/S2 +, no rubs, no gallops Respiratory: CTA bilaterally, no wheezing, no rhonchi Abdominal: Soft, NT, ND, bowel sounds + Extremities: no edema, no cyanosis    The results of significant diagnostics from this hospitalization (including imaging, microbiology, ancillary and laboratory) are listed below for reference.     Microbiology: No results found for this or any previous visit (from the past 240 hour(s)).   Labs: BNP (last 3 results) No results for input(s): BNP in the last 8760 hours. Basic Metabolic Panel:  Recent Labs Lab 07/26/15 1615 07/30/15 1610 07/31/15 0503  NA 136 137 139  K 4.1 3.8 4.0  CL 102 107 112*  CO2 30 23 24   GLUCOSE 100* 107* 97  BUN 25* 28* 19  CREATININE 1.32 1.22 1.08  CALCIUM 9.1 8.5* 8.1*   Liver Function Tests:  Recent Labs Lab 07/30/15 1610  AST 14*  ALT 13*  ALKPHOS 66  BILITOT 0.8  PROT 6.9  ALBUMIN 3.3*   No results for input(s): LIPASE, AMYLASE in the last 168 hours. No results for input(s): AMMONIA in the last 168 hours. CBC:  Recent Labs Lab 07/26/15 1615 07/30/15 1610 07/30/15 1830 07/31/15 0503 08/01/15 0444  WBC 11.3* 9.9 10.2 7.0 7.4  NEUTROABS 7.8* 7.1  --   --   --   HGB 12.1* 11.9* 11.6* 11.0* 11.7*  HCT 38.0* 36.9* 35.2* 33.9* 36.1*  MCV 79.9 79.7 79.6 80.0 80.2  PLT 212.0 255 243 230 247   Cardiac Enzymes: No results for input(s): CKTOTAL, CKMB, CKMBINDEX, TROPONINI in the last 168 hours. BNP: Invalid input(s): POCBNP CBG: No results for input(s): GLUCAP in the last 168 hours. D-Dimer No results for input(s): DDIMER in the last 72 hours. Hgb A1c No results for  input(s): HGBA1C in the last 72 hours. Lipid Profile No results for input(s): CHOL, HDL, LDLCALC, TRIG, CHOLHDL, LDLDIRECT in the last 72 hours. Thyroid function studies No results for input(s): TSH, T4TOTAL, T3FREE, THYROIDAB in the last 72 hours.  Invalid input(s): FREET3 Anemia work up No results for input(s): VITAMINB12, FOLATE, FERRITIN, TIBC, IRON, RETICCTPCT in the last 72 hours.  Urinalysis    Component Value Date/Time   COLORURINE YELLOW 06/04/2014 0823   APPEARANCEUR CLEAR 06/04/2014 0823   LABSPEC >=1.030* 06/04/2014 0823   PHURINE 6.0 06/04/2014 0823   GLUCOSEU NEGATIVE 06/04/2014 0823   HGBUR NEGATIVE 06/04/2014 0823   BILIRUBINUR NEGATIVE 06/04/2014 0823   KETONESUR NEGATIVE 06/04/2014 0823   UROBILINOGEN 0.2 06/04/2014 0823   NITRITE NEGATIVE 06/04/2014 0823   LEUKOCYTESUR NEGATIVE 06/04/2014 0823   Sepsis Labs Invalid input(s): PROCALCITONIN,  WBC,  LACTICIDVEN Microbiology No results found for this or any previous visit (from the past 240 hour(s)).   Time coordinating discharge: Over 30 minutes  SIGNED:   Debbe Odea, MD  [facility] 08/01/2015, 11:19 AM Pager   If 7PM-7AM, please contact night-coverage [email or web address] Password [email or web address]

## 2015-08-01 NOTE — Progress Notes (Signed)
Had a medium, brown, formed stool this AM.  No visible blood

## 2015-08-03 ENCOUNTER — Other Ambulatory Visit: Payer: Self-pay

## 2015-08-03 ENCOUNTER — Telehealth: Payer: Self-pay | Admitting: Internal Medicine

## 2015-08-03 DIAGNOSIS — D62 Acute posthemorrhagic anemia: Secondary | ICD-10-CM

## 2015-08-03 NOTE — Telephone Encounter (Signed)
I called pt- he wanted PCP aware of his recent hospital admission. Also, he is requesting [name] to complete FMLA forms from [facility]. Pt is aware that PCP is out of the office until 08/08/15.

## 2015-08-03 NOTE — Telephone Encounter (Signed)
Pt request to speak to the assistant concern about information he wants to provide when he was in the hospital. Please call him back

## 2015-08-08 ENCOUNTER — Other Ambulatory Visit (INDEPENDENT_AMBULATORY_CARE_PROVIDER_SITE_OTHER): Payer: Medicare Other

## 2015-08-08 DIAGNOSIS — K921 Melena: Secondary | ICD-10-CM | POA: Diagnosis not present

## 2015-08-08 LAB — CBC WITH DIFFERENTIAL/PLATELET
BASOS ABS: 0 10*3/uL (ref 0.0–0.1)
Basophils Relative: 0.4 % (ref 0.0–3.0)
Eosinophils Absolute: 0.2 10*3/uL (ref 0.0–0.7)
Eosinophils Relative: 2 % (ref 0.0–5.0)
HCT: 36.7 % — ABNORMAL LOW (ref 39.0–52.0)
Hemoglobin: 11.9 g/dL — ABNORMAL LOW (ref 13.0–17.0)
LYMPHS ABS: 2 10*3/uL (ref 0.7–4.0)
Lymphocytes Relative: 23.7 % (ref 12.0–46.0)
MCHC: 32.5 g/dL (ref 30.0–36.0)
MCV: 79.4 fl (ref 78.0–100.0)
MONOS PCT: 8.2 % (ref 3.0–12.0)
Monocytes Absolute: 0.7 10*3/uL (ref 0.1–1.0)
NEUTROS PCT: 65.7 % (ref 43.0–77.0)
Neutro Abs: 5.6 10*3/uL (ref 1.4–7.7)
Platelets: 298 10*3/uL (ref 150.0–400.0)
RBC: 4.62 Mil/uL (ref 4.22–5.81)
RDW: 14.6 % (ref 11.5–15.5)
WBC: 8.5 10*3/uL (ref 4.0–10.5)

## 2015-08-09 ENCOUNTER — Encounter: Payer: Self-pay | Admitting: Internal Medicine

## 2015-08-09 NOTE — Telephone Encounter (Signed)
FMLA papers received/given to PCP on 08/08/15.

## 2015-08-15 ENCOUNTER — Encounter: Payer: Self-pay | Admitting: Internal Medicine

## 2015-08-15 NOTE — Telephone Encounter (Signed)
Done. Thx.

## 2015-08-19 ENCOUNTER — Encounter: Payer: Self-pay | Admitting: Internal Medicine

## 2015-08-22 ENCOUNTER — Other Ambulatory Visit: Payer: Self-pay | Admitting: Internal Medicine

## 2015-08-22 MED ORDER — RIVAROXABAN 20 MG PO TABS: 20.0000 mg | ORAL_TABLET | Freq: Every day | ORAL | 5 refills | Status: DC

## 2015-08-23 ENCOUNTER — Encounter: Payer: Self-pay | Admitting: Internal Medicine

## 2015-08-29 ENCOUNTER — Encounter: Payer: Self-pay | Admitting: Oncology

## 2015-08-29 ENCOUNTER — Telehealth: Payer: Self-pay | Admitting: Oncology

## 2015-08-29 NOTE — Telephone Encounter (Signed)
Returned pt call, confirmed appt date/time, in basket referring provider with appt date/time, completed intake Mailed pt letter.

## 2015-08-31 ENCOUNTER — Other Ambulatory Visit: Payer: Self-pay | Admitting: Internal Medicine

## 2015-08-31 MED ORDER — APIXABAN 5 MG PO TABS: 5.0000 mg | ORAL_TABLET | Freq: Two times a day (BID) | ORAL | 4 refills | Status: DC

## 2015-09-07 ENCOUNTER — Ambulatory Visit (HOSPITAL_BASED_OUTPATIENT_CLINIC_OR_DEPARTMENT_OTHER): Payer: Medicare Other | Admitting: Oncology

## 2015-09-07 ENCOUNTER — Telehealth: Payer: Self-pay | Admitting: Oncology

## 2015-09-07 VITALS — BP 121/58 | HR 74 | Temp 98.4°F | Resp 18 | Wt 223.5 lb

## 2015-09-07 DIAGNOSIS — I2510 Atherosclerotic heart disease of native coronary artery without angina pectoris: Secondary | ICD-10-CM

## 2015-09-07 DIAGNOSIS — I82412 Acute embolism and thrombosis of left femoral vein: Secondary | ICD-10-CM

## 2015-09-07 DIAGNOSIS — C61 Malignant neoplasm of prostate: Secondary | ICD-10-CM | POA: Diagnosis not present

## 2015-09-07 NOTE — Telephone Encounter (Signed)
gav ept cal & avs

## 2015-09-07 NOTE — Progress Notes (Signed)
Reason for Referral: Deep vein thrombosis.   HPI: 75 year old gentleman currently of [place] where he lived the majority of his life. She gentleman with a history of coronary artery disease and osteoarthritis but has been in reasonably good health. He remains active and works full time. He reports he developed a right sided deep vein thrombosis of 5 years ago that was essentially unprovoked. He noticed a swelling in that leg and his workup revealed a clot that was treated with warfarin for 6 months. He has been off anticoagulation since that time. He was diagnosed with prostate cancer in 2015 and was treated with external beam radiation and seed implant. He had a Gleason score 3+3 = 6 and a PSA 4.87. His cancer remains in remission at this time continues to follow with Dr. Jeffie Pollock regarding this issue. In June 2017 presented with left leg edema and Dopplers of his left lower extremity showed a from all, popliteal and posterior tibial veins to have deep vein thrombosis. He was subsequently started on Xarelto and was hospitalized briefly between July 1 and July 3 of 2017 for rectal bleeding. It was felt that he had hemorrhoidal bleed which have resolved at this time. He continues to be on Xarelto for planning transition to Eliquis for financial reasons.  He denied any provoking symptoms of his recent blood clot. He denied any recent travel, surgery or hormone supplements. He did have minor trauma while gardening with his wife. His mother had a deep vein thrombosis but no other family history. No malignancy noted in his family either. He reports persistent left leg edema that is currently improving.  He does not report any headaches, blurry vision, syncope or seizures. He does not report any fevers, chills, sweats or weight loss. He does not report any chest pain, palpitation, orthopnea or dyspnea on exertion. He does not report any cough, wheezing or hemoptysis. He does not report any nausea, vomiting,  abdominal pain or other satiety. He does not report any frequency urgency or hesitancy. Remaining review of systems unremarkable.   Past Medical History:  Diagnosis Date  . Asymptomatic stenosis of left carotid artery without infarction    per duplex LICA [id number] ([id number])  . At risk for sleep apnea    STOP-BANG= 4     SENT TO PCP 12-23-2013  . BPH with urinary obstruction    hyperplasia  . Colon polyps   . Congenital renal cyst, single   . Coronary artery disease    cardiologist--  dr Johnsie Cancel-- myoview 07-05-2010 poss. small inferior wall infact/  no ischemia/ ef 51%/  cardic CT score 28 and <50% LAD/D! disease  . Depression   . Diverticulitis   . ED (erectile dysfunction)    secondary arterial insuffiency  . Esophageal achalasia    s/p multiple dilatations and botox injection tx and surgical intervention 06-17-2011  [facility] Myotomy  . Hematospermia   . History of DVT of lower extremity    06/ 2012  . History of esophageal dilatation    and [facility] tx's at [facility]  . Hypertension   . Internal hemorrhoid 2003   Dr. Henrene Pastor  . LBBB (left bundle branch block)   . LBP (low back pain)   . Nocturia   . Prostate cancer ([place])   . Wears glasses   :  Past Surgical History:  Procedure Laterality Date  . CARDIOVASCULAR STRESS TEST  07-05-2010  dr Johnsie Cancel   Adenosine nuclear study/  no significant ST segment change suggestive of ischemia/  possible small inferior wall infarct at mid and basal level/ ef 51%/  LBBB/  normal wall motion  . COLONOSCOPY  last one 2009  . INGUINAL HERNIA REPAIR Left 10/13/2012   Procedure: OPEN LEFT INGUINAL HERNIA REPAIR WITH ON Q PUMP;  Surgeon: Odis Hollingshead, MD;  Location: [facility];  Service: General;  Laterality: Left;  . INSERTION OF MESH Left 10/13/2012   Procedure: INSERTION OF MESH;  Surgeon: Odis Hollingshead, MD;  Location: [facility];  Service: General;  Laterality: Left;  . LAPAROSCOPIC HELLER MYOTOMY  06-16-2001   w/ intraoperative upper  endoscopy guidence (for achalasia)  . RADIOACTIVE SEED IMPLANT N/A 12/31/2013   Procedure: RADIOACTIVE SEED IMPLANT;  Surgeon: Malka So, MD;  Location[facility];  Service: Urology;  Laterality: N/A;  seeds implanted 78 no seeds found in bladder  . TRANSTHORACIC ECHOCARDIOGRAM  07-05-2010   mild LVH/ septal motion consistent w/ LBBB/ ef 45-50%/  diffuse hypokinesis/  grade I diastolic dysfunction/ mild LAE/  trivial TR  . UMBILICAL HERNIA REPAIR  11-11-2005  :   Current Outpatient Prescriptions:  .  amLODipine (NORVASC) 5 MG tablet, TAKE ONE TABLET BY MOUTH ONCE DAILY, Disp: 90 tablet, Rfl: 3 .  IRON PO, Take 65 mg by mouth daily., Disp: , Rfl:  .  losartan-hydrochlorothiazide (HYZAAR) 100-25 MG tablet, TAKE ONE TABLET BY MOUTH ONCE DAILY, Disp: 90 tablet, Rfl: 3 .  Multiple Vitamin (MULTIVITAMIN) tablet, Take 1 tablet by mouth daily., Disp: , Rfl:  .  rivaroxaban (XARELTO) 20 MG TABS tablet, Take 20 mg by mouth daily with supper., Disp: , Rfl:  .  terazosin (HYTRIN) 10 MG capsule, Take 10 mg by mouth at bedtime. , Disp: , Rfl:  .  apixaban (ELIQUIS) 5 MG TABS tablet, Take 1 tablet (5 mg total) by mouth 2 (two) times daily. (Patient not taking: Reported on 09/07/2015), Disp: 60 tablet, Rfl: 4 .  bisacodyl (DULCOLAX) 10 MG suppository, Place 1 suppository (10 mg total) rectally as needed for moderate constipation. (Patient not taking: Reported on 09/07/2015), Disp: 12 suppository, Rfl: 0 .  bisacodyl (DULCOLAX) 5 MG EC tablet, Take 1 tablet (5 mg total) by mouth daily as needed for moderate constipation. (Patient not taking: Reported on 09/07/2015), Disp: 30 tablet, Rfl: 0 .  HYDROcodone-acetaminophen (NORCO) 5-325 MG tablet, Take 1 tablet by mouth every 6 (six) hours as needed for moderate pain. (Patient not taking: Reported on 09/07/2015), Disp: 60 tablet, Rfl: 0 .  polyethylene glycol powder (GLYCOLAX/MIRALAX) powder, Take 255 g by mouth daily as needed. (Patient not taking:  Reported on 09/07/2015), Disp: 255 g, Rfl: 0 .  senna (SENOKOT) 8.6 MG TABS tablet, Take 1 tablet (8.6 mg total) by mouth daily as needed for mild constipation. (Patient not taking: Reported on 09/07/2015), Disp: 120 each, Rfl: 0 .  triamcinolone (KENALOG) 0.025 % ointment, Apply topically 3 (three) times daily. (Patient not taking: Reported on 09/07/2015), Disp: 30 g, Rfl: 0 .  triamcinolone ointment (KENALOG) 0.5 %, APPLY 1 APPLICATION TOPICALLY 2 TIMES DAILY (Patient not taking: Reported on 09/07/2015), Disp: 15 g, Rfl: 0:  Allergies  Allergen Reactions  . Iodinated Diagnostic Agents Hives  . Shellfish Allergy Hives    MAINLY -SHRIMP  :  Family History  Problem Relation Age of Onset  . Hypertension Other   . Heart disease Mother 50    CAD  . Kidney disease Mother   . Cancer Father 56    bladder ca; passed in 1999  :  Social History   Social History  . Marital status: Married    Spouse name: N/A  . Number of children: 3  . Years of education: N/A   Occupational History  . HR    Social History Main Topics  . Smoking status: Former Smoker    Packs/day: 0.25    Years: 0.50    Types: Cigarettes    Quit date: 07/03/1965  . Smokeless tobacco: Never Used  . Alcohol use No  . Drug use: No  . Sexual activity: Not on file     Comment: one pack of cigarettes would last a week   Other Topics Concern  . Not on file   Social History Narrative   Regular exercise-no  :  Pertinent items are noted in HPI.  Exam: Blood pressure (!) 121/58, pulse 74, temperature 98.4 F (36.9 C), temperature source Oral, resp. rate 18, weight 223 lb 8 oz (101.4 kg), SpO2 98 %. ECOG 0 General appearance: alert and cooperative. Appeared without distress. Head: Normocephalic, without obvious abnormality Throat: lips, mucosa, and tongue normal; no thrush or ulcers. Neck: no adenopathy and no carotid bruit Back: negative Resp: clear to auscultation bilaterally Chest wall: no tenderness Cardio: regular  rate and rhythm, S1, S2 normal, no murmur, click, rub or gallop GI: soft, non-tender; bowel sounds normal; no masses,  no organomegaly Extremities: Lower extremity edema noted bilaterally left more than the right.    CBC    Component Value Date/Time   WBC 8.5 08/08/2015 1515   RBC 4.62 08/08/2015 1515   HGB 11.9 (L) 08/08/2015 1515   HCT 36.7 (L) 08/08/2015 1515   PLT 298.0 08/08/2015 1515   MCV 79.4 08/08/2015 1515   MCH 26.0 08/01/2015 0444   MCHC 32.5 08/08/2015 1515   RDW 14.6 08/08/2015 1515   LYMPHSABS 2.0 08/08/2015 1515   MONOABS 0.7 08/08/2015 1515   EOSABS 0.2 08/08/2015 1515   BASOSABS 0.0 08/08/2015 1515     Chemistry      Component Value Date/Time   NA 139 07/31/2015 0503   K 4.0 07/31/2015 0503   CL 112 (H) 07/31/2015 0503   CO2 24 07/31/2015 0503   BUN 19 07/31/2015 0503   CREATININE 1.08 07/31/2015 0503      Component Value Date/Time   CALCIUM 8.1 (L) 07/31/2015 0503   ALKPHOS 66 07/30/2015 1610   AST 14 (L) 07/30/2015 1610   ALT 13 (L) 07/30/2015 1610   BILITOT 0.8 07/30/2015 1610       Assessment and Plan:   75 year old gentleman with the following issues  1. Recurrent deep vein thrombosis. He was diagnosed with a right-sided lower extremity DVT 5 years ago and was treated with 6 months of warfarin. The clot appears to be unprovoked and most recently developed a left lower extremity DVT in June 2017. There is very little provoking symptoms and his most recent clot. He does have 1 brother with a deep vein thrombosis but no family history of other malignancies.  The natural course of recurrent thrombophilia was discussed with the patient and his wife. He appeared to have 2 unprovoked deep vein thrombosis which puts him at risk of recurrent deep vein thrombosis and pulmonary embolism off anticoagulation. His risk of recurrent thrombosis is related to the high and that puts him at a danger of having a life-threatening pulmonary embolism.  The etiology  of his recurrent thrombosis is unclear. He could have familial or acquired thrombophilia. We have discussed these conditions today as well  as the screening associated with these conditions. I discussed with him the risks and benefits of obtaining these blood tests and he declined at this time. We have discussed the ramifications and the implication of having inherited thrombophilia for his children. Occult malignancy such as colon or pancreatic cancer is less likely given his up-to-date screening as well as imaging studies obtained in July 2016.  Given these findings, I have recommended long-term anticoagulation at least beyond 6 months. We will discuss obtaining hypercoagulable panel with the next visit if he is agreeable at that time.  I have no objections to long-term Eliquis therapy and we have discussed the risks and benefits compared to warfarin. At this time he is comfortable switching Xarelto to Eliquis.  2. Prostate cancer: He was diagnosed in 2015 and appear to be an early-stage disease. It has no influence on his recurrent thrombosis at this time.

## 2015-09-14 DIAGNOSIS — N401 Enlarged prostate with lower urinary tract symptoms: Secondary | ICD-10-CM | POA: Diagnosis not present

## 2015-09-14 DIAGNOSIS — Z8546 Personal history of malignant neoplasm of prostate: Secondary | ICD-10-CM | POA: Diagnosis not present

## 2015-09-14 DIAGNOSIS — R351 Nocturia: Secondary | ICD-10-CM | POA: Diagnosis not present

## 2015-09-14 DIAGNOSIS — N281 Cyst of kidney, acquired: Secondary | ICD-10-CM | POA: Diagnosis not present

## 2015-09-14 DIAGNOSIS — N5201 Erectile dysfunction due to arterial insufficiency: Secondary | ICD-10-CM | POA: Diagnosis not present

## 2015-10-04 ENCOUNTER — Other Ambulatory Visit: Payer: Self-pay

## 2015-10-24 ENCOUNTER — Ambulatory Visit: Payer: Medicare Other | Admitting: Internal Medicine

## 2015-10-26 ENCOUNTER — Encounter: Payer: Self-pay | Admitting: Internal Medicine

## 2015-10-26 ENCOUNTER — Ambulatory Visit (INDEPENDENT_AMBULATORY_CARE_PROVIDER_SITE_OTHER): Payer: Medicare Other | Admitting: Internal Medicine

## 2015-10-26 DIAGNOSIS — M5441 Lumbago with sciatica, right side: Secondary | ICD-10-CM

## 2015-10-26 DIAGNOSIS — G8929 Other chronic pain: Secondary | ICD-10-CM

## 2015-10-26 DIAGNOSIS — C61 Malignant neoplasm of prostate: Secondary | ICD-10-CM

## 2015-10-26 DIAGNOSIS — K921 Melena: Secondary | ICD-10-CM | POA: Diagnosis not present

## 2015-10-26 DIAGNOSIS — I82412 Acute embolism and thrombosis of left femoral vein: Secondary | ICD-10-CM | POA: Diagnosis not present

## 2015-10-26 DIAGNOSIS — I10 Essential (primary) hypertension: Secondary | ICD-10-CM

## 2015-10-26 NOTE — Assessment & Plan Note (Signed)
Meloxicam - rare use prn  Potential benefits of a long term NSAID  use as well as potential risks  and complications were explained to the patient and were aknowledged.   Marland Kitchen

## 2015-10-26 NOTE — Assessment & Plan Note (Signed)
Losartan HCT, Amlodipine, Terazosyn 

## 2015-10-26 NOTE — Patient Instructions (Signed)
Stop Amlodipine 

## 2015-10-26 NOTE — Progress Notes (Signed)
Pre visit review using our clinic review tool, if applicable. No additional management support is needed unless otherwise documented below in the visit note. 

## 2015-10-26 NOTE — Assessment & Plan Note (Signed)
No relapse 

## 2015-10-26 NOTE — Assessment & Plan Note (Signed)
Doing well - last PSA was 0.51

## 2015-10-26 NOTE — Assessment & Plan Note (Signed)
On Eliquis

## 2015-10-26 NOTE — Progress Notes (Signed)
Subjective:  Patient ID: Dominic Shaffer, male    DOB: 06-25-1940  Age: 75 y.o. MRN: [id number]  CC: Leg Pain (3 month follow up   Pt stated leg pain/cramp especially at night.)   HPI Dominic Shaffer presents for DVT, HTN, prostate ca  Outpatient Medications Prior to Visit  Medication Sig Dispense Refill  . amLODipine (NORVASC) 5 MG tablet TAKE ONE TABLET BY MOUTH ONCE DAILY 90 tablet 3  . apixaban (ELIQUIS) 5 MG TABS tablet Take 1 tablet (5 mg total) by mouth 2 (two) times daily. 60 tablet 4  . bisacodyl (DULCOLAX) 10 MG suppository Place 1 suppository (10 mg total) rectally as needed for moderate constipation. 12 suppository 0  . bisacodyl (DULCOLAX) 5 MG EC tablet Take 1 tablet (5 mg total) by mouth daily as needed for moderate constipation. 30 tablet 0  . HYDROcodone-acetaminophen (NORCO) 5-325 MG tablet Take 1 tablet by mouth every 6 (six) hours as needed for moderate pain. 60 tablet 0  . IRON PO Take 65 mg by mouth daily.    Marland Kitchen losartan-hydrochlorothiazide (HYZAAR) 100-25 MG tablet TAKE ONE TABLET BY MOUTH ONCE DAILY 90 tablet 3  . Multiple Vitamin (MULTIVITAMIN) tablet Take 1 tablet by mouth daily.    . polyethylene glycol powder (GLYCOLAX/MIRALAX) powder Take 255 g by mouth daily as needed. 255 g 0  . senna (SENOKOT) 8.6 MG TABS tablet Take 1 tablet (8.6 mg total) by mouth daily as needed for mild constipation. 120 each 0  . terazosin (HYTRIN) 10 MG capsule Take 10 mg by mouth at bedtime.     . triamcinolone (KENALOG) 0.025 % ointment Apply topically 3 (three) times daily. 30 g 0  . triamcinolone ointment (KENALOG) 0.5 % APPLY 1 APPLICATION TOPICALLY 2 TIMES DAILY 15 g 0  . rivaroxaban (XARELTO) 20 MG TABS tablet Take 20 mg by mouth daily with supper.     No facility-administered medications prior to visit.     ROS Review of Systems  Constitutional: Negative for appetite change, fatigue and unexpected weight change.  HENT: Negative for congestion, nosebleeds,  sneezing, sore throat and trouble swallowing.   Eyes: Negative for itching and visual disturbance.  Respiratory: Negative for cough.   Cardiovascular: Positive for leg swelling. Negative for chest pain and palpitations.  Gastrointestinal: Negative for abdominal distention, blood in stool, diarrhea and nausea.  Genitourinary: Negative for frequency and hematuria.  Musculoskeletal: Negative for back pain, gait problem, joint swelling and neck pain.  Skin: Negative for rash.  Neurological: Negative for dizziness, tremors, speech difficulty and weakness.  Psychiatric/Behavioral: Negative for agitation, dysphoric mood and sleep disturbance. The patient is not nervous/anxious.     Objective:  BP 128/76 (BP Location: Left Arm, Patient Position: Sitting, Cuff Size: Normal)   Pulse 88   Temp 98.2 F (36.8 C)   Ht 5\' 10"  (1.778 m)   Wt 224 lb (101.6 kg)   SpO2 94%   BMI 32.14 kg/m   BP Readings from Last 3 Encounters:  10/26/15 128/76  09/07/15 (!) 121/58  08/01/15 (!) 116/56    Wt Readings from Last 3 Encounters:  10/26/15 224 lb (101.6 kg)  09/07/15 223 lb 8 oz (101.4 kg)  07/30/15 222 lb 7.1 oz (100.9 kg)    Physical Exam  Constitutional: He is oriented to person, place, and time. He appears well-developed. No distress.  NAD  HENT:  Mouth/Throat: Oropharynx is clear and moist.  Eyes: Conjunctivae are normal. Pupils are equal, round, and reactive to light.  Neck: Normal range of motion. No JVD present. No thyromegaly present.  Cardiovascular: Normal rate, regular rhythm, normal heart sounds and intact distal pulses.  Exam reveals no gallop and no friction rub.   No murmur heard. Pulmonary/Chest: Effort normal and breath sounds normal. No respiratory distress. He has no wheezes. He has no rales. He exhibits no tenderness.  Abdominal: Soft. Bowel sounds are normal. He exhibits no distension and no mass. There is no tenderness. There is no rebound and no guarding.  Musculoskeletal:  Normal range of motion. He exhibits edema. He exhibits no tenderness.  Lymphadenopathy:    He has no cervical adenopathy.  Neurological: He is alert and oriented to person, place, and time. He has normal reflexes. No cranial nerve deficit. He exhibits normal muscle tone. He displays a negative Romberg sign. Coordination and gait normal.  Skin: Skin is warm and dry. No rash noted.  Psychiatric: He has a normal mood and affect. His behavior is normal. Judgment and thought content normal.  R ankle w/swelling  Lab Results  Component Value Date   WBC 8.5 08/08/2015   HGB 11.9 (L) 08/08/2015   HCT 36.7 (L) 08/08/2015   PLT 298.0 08/08/2015   GLUCOSE 97 07/31/2015   CHOL 141 06/04/2014   TRIG 89.0 06/04/2014   HDL 32.20 (L) 06/04/2014   LDLCALC 91 06/04/2014   ALT 13 (L) 07/30/2015   AST 14 (L) 07/30/2015   NA 139 07/31/2015   K 4.0 07/31/2015   CL 112 (H) 07/31/2015   CREATININE 1.08 07/31/2015   BUN 19 07/31/2015   CO2 24 07/31/2015   TSH 1.61 06/04/2014   PSA 1.70 04/29/2007   INR 2.58 (H) 07/30/2015    No results found.  Assessment & Plan:   There are no diagnoses linked to this encounter. I am having Dominic Shaffer maintain his triamcinolone ointment, losartan-hydrochlorothiazide, amLODipine, terazosin, HYDROcodone-acetaminophen, triamcinolone, senna, polyethylene glycol powder, bisacodyl, bisacodyl, apixaban, rivaroxaban, IRON PO, and multivitamin.  No orders of the defined types were placed in this encounter.    Follow-up: No Follow-up on file.  Walker Kehr, MD

## 2015-11-05 ENCOUNTER — Encounter: Payer: Self-pay | Admitting: Internal Medicine

## 2016-03-05 ENCOUNTER — Telehealth: Payer: Self-pay | Admitting: Oncology

## 2016-03-05 NOTE — Telephone Encounter (Signed)
Patient called to cancel appointment. Will reschedule possibly at a later time.

## 2016-03-09 ENCOUNTER — Ambulatory Visit: Payer: Medicare Other | Admitting: Oncology

## 2016-03-20 DIAGNOSIS — Z8546 Personal history of malignant neoplasm of prostate: Secondary | ICD-10-CM | POA: Diagnosis not present

## 2016-03-28 DIAGNOSIS — N281 Cyst of kidney, acquired: Secondary | ICD-10-CM | POA: Diagnosis not present

## 2016-03-28 DIAGNOSIS — Z8546 Personal history of malignant neoplasm of prostate: Secondary | ICD-10-CM | POA: Diagnosis not present

## 2016-06-01 ENCOUNTER — Telehealth: Payer: Self-pay | Admitting: Internal Medicine

## 2016-06-01 MED ORDER — TRIAMCINOLONE ACETONIDE 0.025 % EX OINT: TOPICAL_OINTMENT | Freq: Three times a day (TID) | CUTANEOUS | 1 refills | Status: DC

## 2016-06-01 NOTE — Telephone Encounter (Signed)
Pt would like a refill of triamcinolone ointment (KENALOG) 0.5 %   Last Rx By Dr. Wynelle Cleveland. [facility] by [facility] in 2016. Pt does not use everyday just when he needs it.   [facility] on [address]

## 2016-06-01 NOTE — Telephone Encounter (Signed)
Sent ointment rx  to [facility]...Dominic Shaffer

## 2016-06-01 NOTE — Telephone Encounter (Signed)
OK to fill this/these prescription(s) with additional refills x1 Thx

## 2016-06-01 NOTE — Telephone Encounter (Signed)
Pls advise if ok to refill../lmb 

## 2016-06-30 ENCOUNTER — Other Ambulatory Visit: Payer: Self-pay | Admitting: Internal Medicine

## 2016-07-19 ENCOUNTER — Encounter: Payer: Self-pay | Admitting: Internal Medicine

## 2016-07-19 ENCOUNTER — Ambulatory Visit (INDEPENDENT_AMBULATORY_CARE_PROVIDER_SITE_OTHER): Payer: Medicare Other | Admitting: Internal Medicine

## 2016-07-19 VITALS — BP 132/82 | HR 60 | Ht 70.0 in | Wt 228.0 lb

## 2016-07-19 DIAGNOSIS — I10 Essential (primary) hypertension: Secondary | ICD-10-CM | POA: Diagnosis not present

## 2016-07-19 DIAGNOSIS — Z0279 Encounter for issue of other medical certificate: Secondary | ICD-10-CM

## 2016-07-19 DIAGNOSIS — F411 Generalized anxiety disorder: Secondary | ICD-10-CM | POA: Diagnosis not present

## 2016-07-19 DIAGNOSIS — I82412 Acute embolism and thrombosis of left femoral vein: Secondary | ICD-10-CM | POA: Diagnosis not present

## 2016-07-19 NOTE — Progress Notes (Signed)
Subjective:    Patient ID: Dominic Shaffer, male    DOB: 04-22-1940, 76 y.o.   MRN: [id number]  HPI  Here to f/u as he is still working and needs FMLA renewed;  Pt denies chest pain, increasing sob or doe, wheezing, orthopnea, PND, increased LE swelling, palpitations, dizziness or syncope.  Pt denies new neurological symptoms such as new headache, or facial or extremity weakness or numbness.  Pt denies polydipsia, polyuria, .  Pt states overall good compliance with meds. Pt continues to have recurring LBP without change in severity, bowel or bladder change, fever, wt loss,  worsening LE pain/numbness/weakness, gait change or falls.  Denies worsening depressive symptoms, suicidal ideation, or pani Past Medical History:  Diagnosis Date  . Asymptomatic stenosis of left carotid artery without infarction    per duplex LICA 02-63% ([id number])  . At risk for sleep apnea    STOP-BANG= 4     SENT TO PCP 12-23-2013  . BPH with urinary obstruction    hyperplasia  . Colon polyps   . Congenital renal cyst, single   . Coronary artery disease    cardiologist--  dr Johnsie Cancel-- myoview 07-05-2010 poss. small inferior wall infact/  no ischemia/ ef 51%/  cardic CT score 28 and <50% LAD/D! disease  . Depression   . Diverticulitis   . ED (erectile dysfunction)    secondary arterial insuffiency  . Esophageal achalasia    s/p multiple dilatations and botox injection tx and surgical intervention 06-17-2011  [facility] Myotomy  . Hematospermia   . History of DVT of lower extremity    06/ 2012  . History of esophageal dilatation    and botox injection tx's at [facility]  . Hypertension   . Internal hemorrhoid 2003   Dr. Henrene Pastor  . LBBB (left bundle branch block)   . LBP (low back pain)   . Nocturia   . Prostate cancer ([place])   . Wears glasses    Past Surgical History:  Procedure Laterality Date  . CARDIOVASCULAR STRESS TEST  07-05-2010  dr Johnsie Cancel   Adenosine nuclear study/  no significant ST segment change  suggestive of ischemia/  possible small inferior wall infarct at mid and basal level/ ef 51%/  LBBB/  normal wall motion  . COLONOSCOPY  last one 2009  . INGUINAL HERNIA REPAIR Left 10/13/2012   Procedure: OPEN LEFT INGUINAL HERNIA REPAIR WITH ON Q PUMP;  Surgeon: Odis Hollingshead, MD;  Location: [facility];  Service: General;  Laterality: Left;  . INSERTION OF MESH Left 10/13/2012   Procedure: INSERTION OF MESH;  Surgeon: Odis Hollingshead, MD;  Location: [facility];  Service: General;  Laterality: Left;  . LAPAROSCOPIC HELLER MYOTOMY  06-16-2001   w/ intraoperative upper endoscopy [place] (for achalasia)  . RADIOACTIVE SEED IMPLANT N/A 12/31/2013   Procedure: RADIOACTIVE SEED IMPLANT;  Surgeon: Malka So, MD;  Location[facility];  Service: Urology;  Laterality: N/A;  seeds implanted 78 no seeds found in bladder  . TRANSTHORACIC ECHOCARDIOGRAM  07-05-2010   mild LVH/ septal motion consistent w/ LBBB/ ef 45-50%/  diffuse hypokinesis/  grade I diastolic dysfunction/ mild LAE/  trivial TR  . UMBILICAL HERNIA REPAIR  11-11-2005    reports that he quit smoking about 51 years ago. His smoking use included Cigarettes. He has a 0.12 pack-year smoking history. He has never used smokeless tobacco. He reports that he does not drink alcohol or use drugs. family history includes Cancer (age of onset:  60) in his father; Heart disease (age of onset: 35) in his mother; Hypertension in his other; Kidney disease in his mother. Allergies  Allergen Reactions  . Iodinated Diagnostic Agents Hives  . Shellfish Allergy Hives    MAINLY -SHRIMP   Current Outpatient Prescriptions on File Prior to Visit  Medication Sig Dispense Refill  . amLODipine (NORVASC) 5 MG tablet TAKE ONE TABLET BY MOUTH ONCE DAILY 90 tablet 3  . apixaban (ELIQUIS) 5 MG TABS tablet Take 1 tablet (5 mg total) by mouth 2 (two) times daily. 60 tablet 4  . bisacodyl (DULCOLAX) 10 MG suppository Place 1 suppository (10 mg total)  rectally as needed for moderate constipation. 12 suppository 0  . bisacodyl (DULCOLAX) 5 MG EC tablet Take 1 tablet (5 mg total) by mouth daily as needed for moderate constipation. 30 tablet 0  . HYDROcodone-acetaminophen (NORCO) 5-325 MG tablet Take 1 tablet by mouth every 6 (six) hours as needed for moderate pain. 60 tablet 0  . IRON PO Take 65 mg by mouth daily.    Marland Kitchen losartan-hydrochlorothiazide (HYZAAR) 100-25 MG tablet TAKE ONE TABLET BY MOUTH ONCE DAILY 90 tablet 3  . Multiple Vitamin (MULTIVITAMIN) tablet Take 1 tablet by mouth daily.    . polyethylene glycol powder (GLYCOLAX/MIRALAX) powder Take 255 g by mouth daily as needed. 255 g 0  . senna (SENOKOT) 8.6 MG TABS tablet Take 1 tablet (8.6 mg total) by mouth daily as needed for mild constipation. 120 each 0  . terazosin (HYTRIN) 10 MG capsule Take 10 mg by mouth at bedtime.     . triamcinolone (KENALOG) 0.025 % ointment Apply topically 3 (three) times daily. 30 g 1  . triamcinolone ointment (KENALOG) 0.5 % APPLY 1 APPLICATION TOPICALLY 2 TIMES DAILY 15 g 0   No current facility-administered medications on file prior to visit.    Review of Systems  Constitutional: Negative for other unusual diaphoresis or sweats HENT: Negative for ear discharge or swelling Eyes: Negative for other worsening visual disturbances Respiratory: Negative for stridor or other swelling  Gastrointestinal: Negative for worsening distension or other blood Genitourinary: Negative for retention or other urinary change Musculoskeletal: Negative for other MSK pain or swelling Skin: Negative for color change or other new lesions Neurological: Negative for worsening tremors and other numbness  Psychiatric/Behavioral: Negative for worsening agitation or other fatigue No other exam findings    Objective:   Physical Exam BP 132/82   Pulse 60   Ht 5\' 10"  (1.778 m)   Wt 228 lb (103.4 kg)   SpO2 98%   BMI 32.71 kg/m  VS noted,  Constitutional: Pt appears in  NAD HENT: Head: NCAT.  Right Ear: External ear normal.  Left Ear: External ear normal.  Eyes: . Pupils are equal, round, and reactive to light. Conjunctivae and EOM are normal Nose: without d/c or deformity Neck: Neck supple. Gross normal ROM Cardiovascular: Normal rate and regular rhythm.   Pulmonary/Chest: Effort normal and breath sounds without rales or wheezing.  Neurological: Pt is alert. At baseline orientation, motor grossly intact Skin: Skin is warm. No rashes, other new lesions, no LE edema Psychiatric: Pt behavior is normal without agitation  No other exam findings    Assessment & Plan:

## 2016-07-19 NOTE — Patient Instructions (Signed)
Your FMLA was re-done today  Please continue all other medications as before, and refills have been done if requested.  Please have the pharmacy call with any other refills you may need.  Please keep your appointments with your specialists as you may have planned

## 2016-07-21 NOTE — Assessment & Plan Note (Signed)
D/w pt, FMLA renewed, cont same tx,  to f/u any worsening symptoms or concerns

## 2016-07-21 NOTE — Assessment & Plan Note (Signed)
Overall mild persistent stable, declines med change, to f/u any worsening symptoms or concerns

## 2016-07-21 NOTE — Assessment & Plan Note (Signed)
stable overall by history and exam, recent data reviewed with pt, and pt to continue medical treatment as before,  to f/u any worsening symptoms or concerns BP Readings from Last 3 Encounters:  07/19/16 132/82  10/26/15 128/76  09/07/15 (!) 121/58

## 2016-07-23 ENCOUNTER — Other Ambulatory Visit: Payer: Self-pay

## 2016-07-23 MED ORDER — AMLODIPINE BESYLATE 5 MG PO TABS: 5.0000 mg | ORAL_TABLET | Freq: Every day | ORAL | 1 refills | Status: DC

## 2016-07-23 MED ORDER — APIXABAN 5 MG PO TABS: 5.0000 mg | ORAL_TABLET | Freq: Two times a day (BID) | ORAL | 1 refills | Status: DC

## 2016-07-23 MED ORDER — TRIAMCINOLONE ACETONIDE 0.025 % EX OINT: TOPICAL_OINTMENT | Freq: Three times a day (TID) | CUTANEOUS | 1 refills | Status: DC

## 2016-08-20 ENCOUNTER — Telehealth: Payer: Self-pay | Admitting: Internal Medicine

## 2016-08-20 DIAGNOSIS — H9190 Unspecified hearing loss, unspecified ear: Secondary | ICD-10-CM

## 2016-08-20 NOTE — Telephone Encounter (Signed)
Referral created.

## 2016-08-20 NOTE — Telephone Encounter (Signed)
Pt would like a refferal to get his hearing checked , he has ana ppt already for 8/30  [facility] [place] [phone]

## 2016-08-23 NOTE — Telephone Encounter (Signed)
Pt called regarding this  He has an appt and without the referral his ins will not pay for it . Please send over  Fax Number [phone]

## 2016-08-23 NOTE — Telephone Encounter (Signed)
Dominic Shaffer is faxing over the referral now.

## 2016-08-27 ENCOUNTER — Ambulatory Visit: Payer: Medicare Other | Admitting: Internal Medicine

## 2016-08-27 DIAGNOSIS — H903 Sensorineural hearing loss, bilateral: Secondary | ICD-10-CM | POA: Diagnosis not present

## 2016-09-04 ENCOUNTER — Ambulatory Visit: Payer: Medicare Other | Admitting: Internal Medicine

## 2016-09-16 IMAGING — DX DG ANKLE COMPLETE 3+V*L*
3 series · 3 of 3 positions shown · non-contrast
Comparison: Tib-fib exam from 02/19/2007

CLINICAL DATA: Left ankle pain and swelling for 4 days. No known
injury.

EXAM:
LEFT ANKLE COMPLETE - 3+ VIEW

[ankle ap]
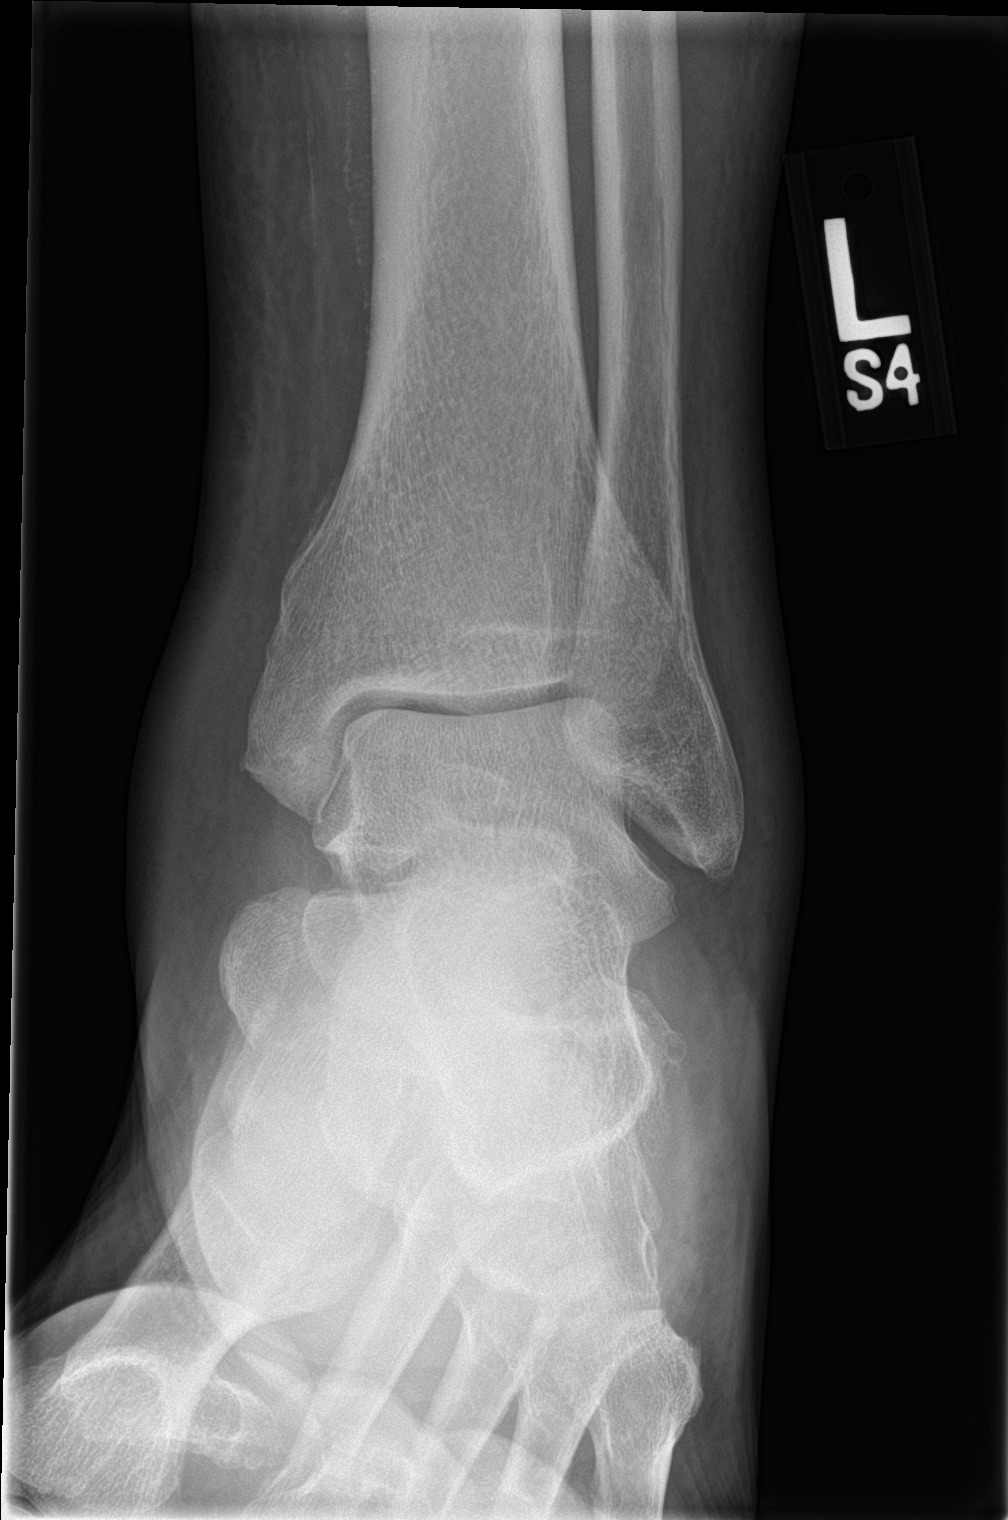

[ankle obl]
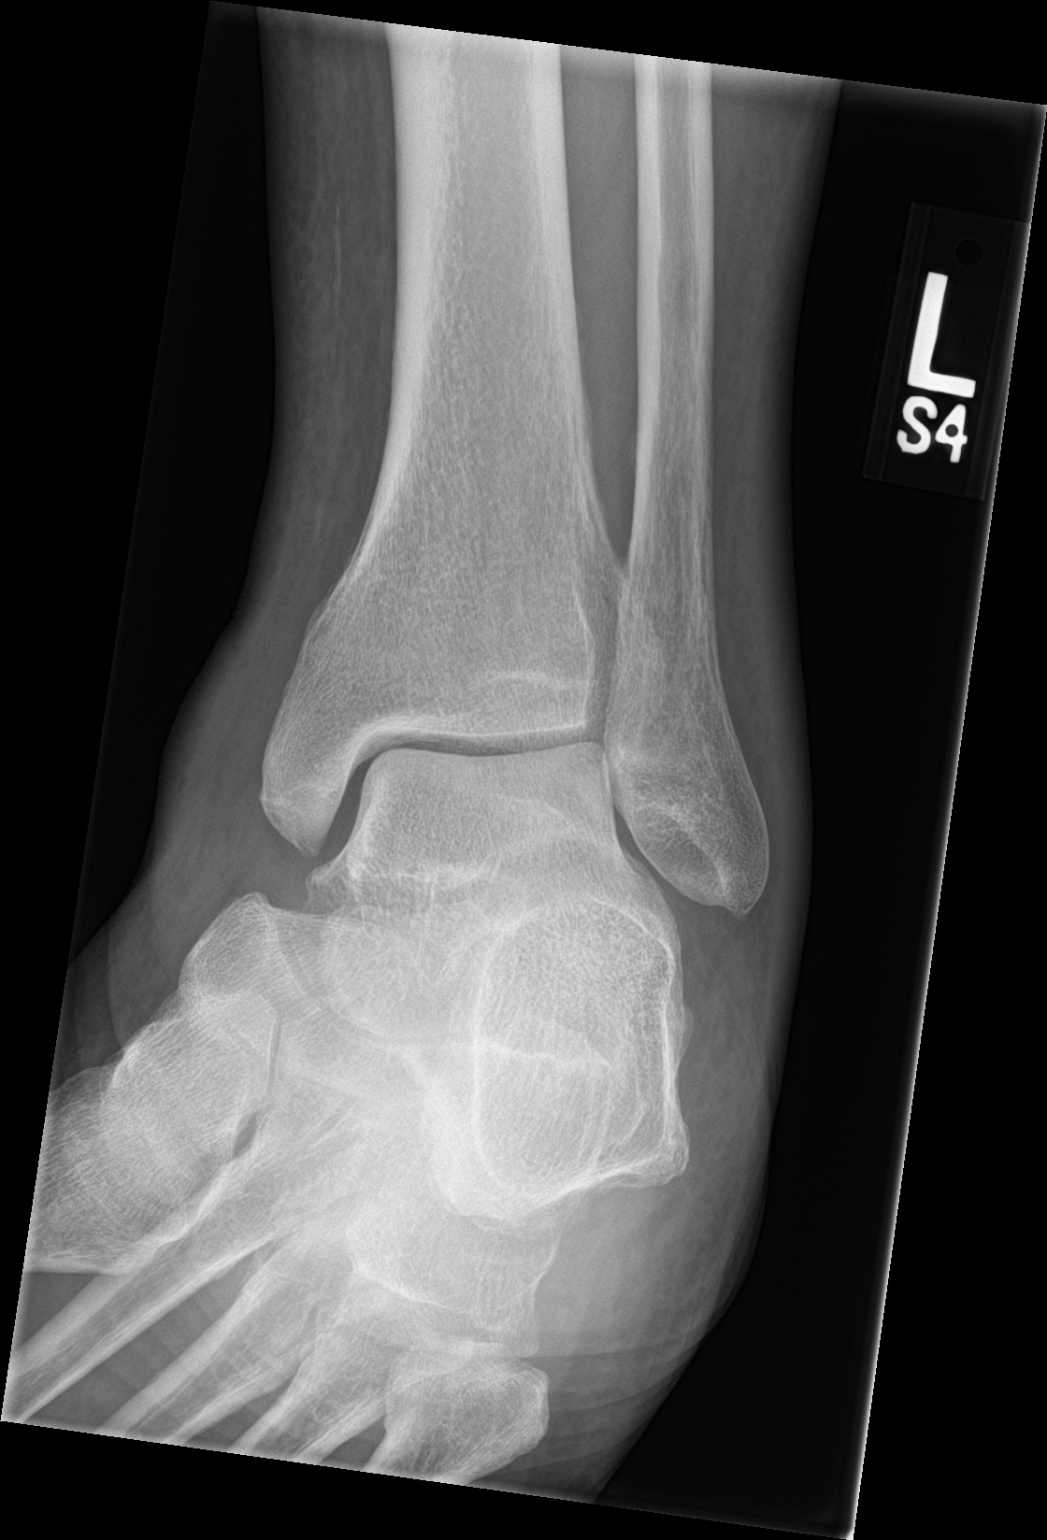

[ankle lat]
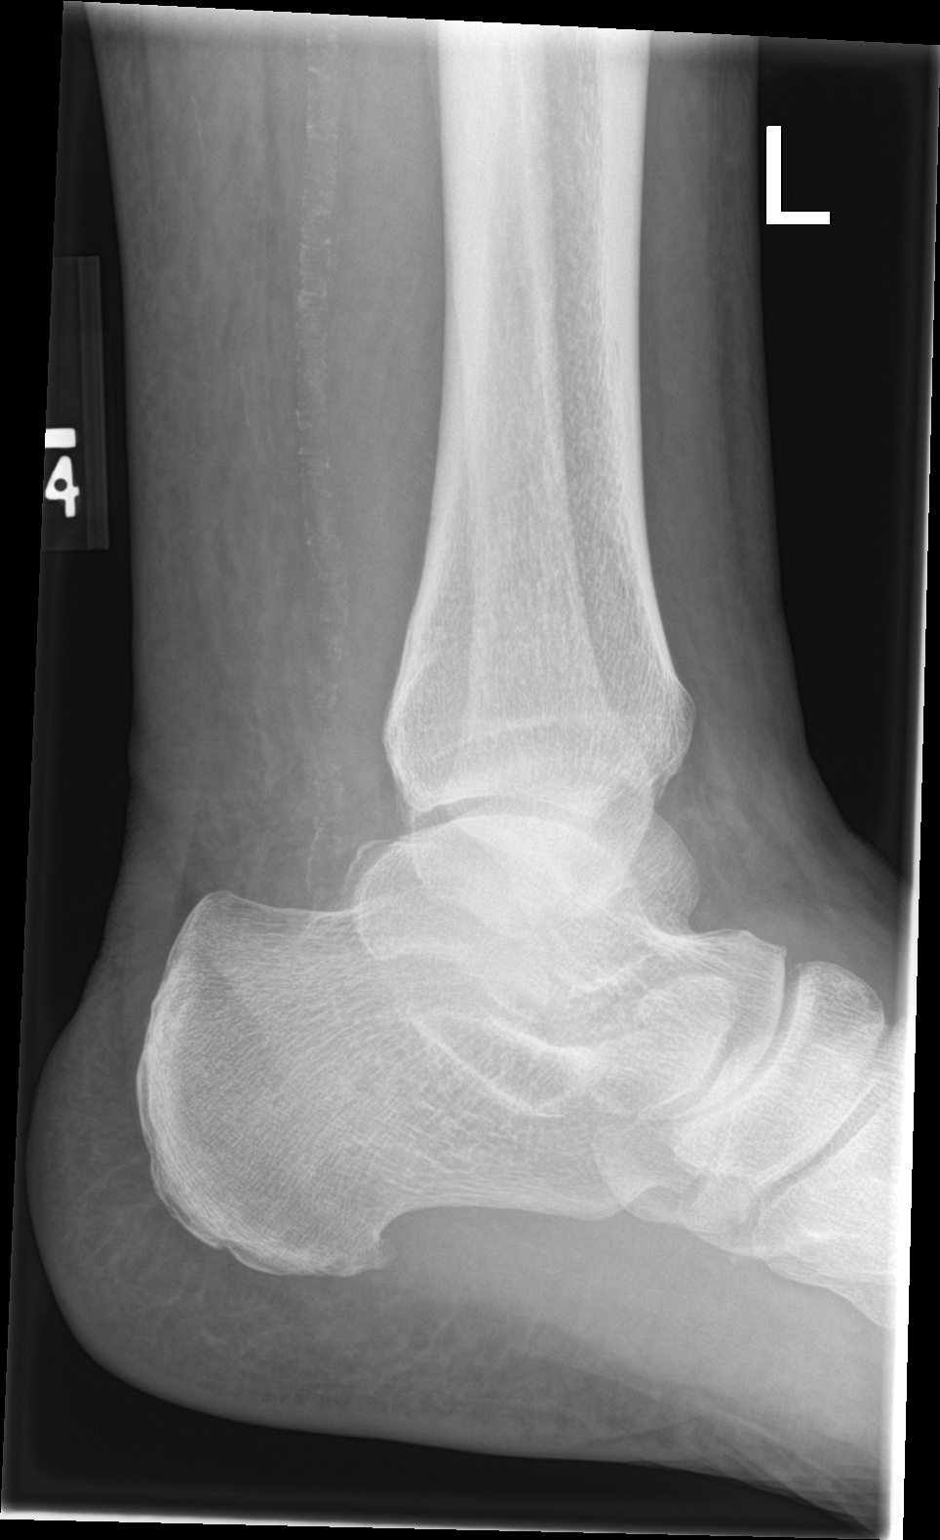

[3 of 3 positions shown; findings below may reference images not displayed]

FINDINGS: There is no evidence of fracture, dislocation, or joint effusion.
There is no evidence of arthropathy or other focal bone abnormality.
Soft tissues are unremarkable.
IMPRESSION: Negative.

## 2016-09-19 DIAGNOSIS — Z8546 Personal history of malignant neoplasm of prostate: Secondary | ICD-10-CM | POA: Diagnosis not present

## 2016-09-26 DIAGNOSIS — Z8546 Personal history of malignant neoplasm of prostate: Secondary | ICD-10-CM | POA: Diagnosis not present

## 2016-09-26 DIAGNOSIS — N401 Enlarged prostate with lower urinary tract symptoms: Secondary | ICD-10-CM | POA: Diagnosis not present

## 2016-09-26 DIAGNOSIS — R351 Nocturia: Secondary | ICD-10-CM | POA: Diagnosis not present

## 2016-09-26 DIAGNOSIS — N281 Cyst of kidney, acquired: Secondary | ICD-10-CM | POA: Diagnosis not present

## 2016-09-27 ENCOUNTER — Other Ambulatory Visit: Payer: Self-pay | Admitting: Urology

## 2016-09-27 DIAGNOSIS — N281 Cyst of kidney, acquired: Secondary | ICD-10-CM

## 2016-10-11 ENCOUNTER — Other Ambulatory Visit (HOSPITAL_COMMUNITY): Payer: Self-pay | Admitting: Interventional Radiology

## 2016-10-11 ENCOUNTER — Ambulatory Visit
Admission: RE | Admit: 2016-10-11 | Discharge: 2016-10-11 | Disposition: A | Discharge status: Home / Self Care | Payer: Medicare Other | Source: Ambulatory Visit | Attending: Urology | Admitting: Urology

## 2016-10-11 ENCOUNTER — Other Ambulatory Visit: Payer: Self-pay | Admitting: Urology

## 2016-10-11 DIAGNOSIS — N281 Cyst of kidney, acquired: Secondary | ICD-10-CM | POA: Diagnosis not present

## 2016-10-11 HISTORY — PX: IR RADIOLOGIST EVAL & MGMT: IMG5224

## 2016-10-11 NOTE — Consult Note (Signed)
Chief Complaint: Patient was seen in consultation today for left renal cyst  Referring Physician(s): Wrenn,John  History of Present Illness: Dominic Shaffer is a 76 y.o. male with past medical history of CAD, DVT, HTN, prostate cancer s/p treatment in 2015, and congenital renal cyst who is known to IR service from previous renal cyst aspiration 06/09/10 at which time 650 mL of fluid was aspirated from the left renal cyst. He did have significant relief of symptoms after procedure. Patient returns to IR clinic today for assessment due to the return of occasional soreness and tightness in his left flank which he attributes to his cyst.  His last available imaging was a CT Abdomen/Pelvis in 2016 which showed reaccumulation of fluid in the cyst.  He denies fever, chills, abdominal pain, hematuria.  He has been treated for his prostate cancer and continues in follow-up with Dr. Jeffie Pollock for this.  He denies other complaints today.   Past Medical History:  Diagnosis Date  . Asymptomatic stenosis of left carotid artery without infarction    per duplex LICA 40-34% ([id number])  . At risk for sleep apnea    STOP-BANG= 4     SENT TO PCP 12-23-2013  . BPH with urinary obstruction    hyperplasia  . Colon polyps   . Congenital renal cyst, single   . Coronary artery disease    cardiologist--  dr Johnsie Cancel-- myoview 07-05-2010 poss. small inferior wall infact/  no ischemia/ ef 51%/  cardic CT score 28 and <50% LAD/D! disease  . Depression   . Diverticulitis   . ED (erectile dysfunction)    secondary arterial insuffiency  . Esophageal achalasia    s/p multiple dilatations and botox injection tx and surgical intervention 06-17-2011  [facility] Myotomy  . Hematospermia   . History of DVT of lower extremity    06/ 2012  . History of esophageal dilatation    and botox injection tx's at [facility]  . Hypertension   . Internal hemorrhoid 2003   Dr. Henrene Pastor  . LBBB (left bundle branch block)   . LBP (low  back pain)   . Nocturia   . Prostate cancer ([place])   . Wears glasses     Past Surgical History:  Procedure Laterality Date  . CARDIOVASCULAR STRESS TEST  07-05-2010  dr Johnsie Cancel   Adenosine nuclear study/  no significant ST segment change suggestive of ischemia/  possible small inferior wall infarct at mid and basal level/ ef 51%/  LBBB/  normal wall motion  . COLONOSCOPY  last one 2009  . INGUINAL HERNIA REPAIR Left 10/13/2012   Procedure: OPEN LEFT INGUINAL HERNIA REPAIR WITH ON Q PUMP;  Surgeon: Odis Hollingshead, MD;  [facility];  Service: General;  Laterality: Left;  . INSERTION OF MESH Left 10/13/2012   Procedure: INSERTION OF MESH;  Surgeon: Odis Hollingshead, MD;  Location[facility];  Service: General;  Laterality: Left;  . LAPAROSCOPIC HELLER MYOTOMY  06-16-2001   w/ intraoperative upper endoscopy [place] (for achalasia)  . RADIOACTIVE SEED IMPLANT N/A 12/31/2013   Procedure: RADIOACTIVE SEED IMPLANT;  Surgeon: Malka So, MD;  Location: [facility];  Service: Urology;  Laterality: N/A;  seeds implanted 78 no seeds found in bladder  . TRANSTHORACIC ECHOCARDIOGRAM  07-05-2010   mild LVH/ septal motion consistent w/ LBBB/ ef 45-50%/  diffuse hypokinesis/  grade I diastolic dysfunction/ mild LAE/  trivial TR  . UMBILICAL HERNIA REPAIR  11-11-2005    Allergies:  Iodinated diagnostic agents and Shellfish allergy  Medications: Prior to Admission medications   Medication Sig Start Date End Date Taking? Authorizing Provider  amLODipine (NORVASC) 5 MG tablet Take 1 tablet (5 mg total) by mouth daily. 07/23/16  Yes Plotnikov, Evie Lacks, MD  apixaban (ELIQUIS) 5 MG TABS tablet Take 1 tablet (5 mg total) by mouth 2 (two) times daily. 07/23/16  Yes Plotnikov, Evie Lacks, MD  bisacodyl (DULCOLAX) 10 MG suppository Place 1 suppository (10 mg total) rectally as needed for moderate constipation. 08/01/15  Yes Debbe Odea, MD  bisacodyl (DULCOLAX) 5 MG EC tablet Take 1 tablet (5  mg total) by mouth daily as needed for moderate constipation. 08/01/15  Yes Debbe Odea, MD  HYDROcodone-acetaminophen (NORCO) 5-325 MG tablet Take 1 tablet by mouth every 6 (six) hours as needed for moderate pain. 07/26/15  Yes Plotnikov, Evie Lacks, MD  IRON PO Take 65 mg by mouth daily. 09/07/15  Yes [provider]  losartan-hydrochlorothiazide (HYZAAR) 100-25 MG tablet TAKE ONE TABLET BY MOUTH ONCE DAILY 06/13/15  Yes Plotnikov, Evie Lacks, MD  Multiple Vitamin (MULTIVITAMIN) tablet Take 1 tablet by mouth daily. 09/07/15  Yes [provider]  polyethylene glycol powder (GLYCOLAX/MIRALAX) powder Take 255 g by mouth daily as needed. 08/01/15  Yes Debbe Odea, MD  senna (SENOKOT) 8.6 MG TABS tablet Take 1 tablet (8.6 mg total) by mouth daily as needed for mild constipation. 08/01/15  Yes Debbe Odea, MD  terazosin (HYTRIN) 10 MG capsule Take 10 mg by mouth at bedtime.  07/23/15  Yes [provider]  triamcinolone (KENALOG) 0.025 % ointment Apply topically 3 (three) times daily. 07/23/16  Yes Plotnikov, Evie Lacks, MD  triamcinolone ointment (KENALOG) 0.5 % APPLY 1 APPLICATION TOPICALLY 2 TIMES DAILY 08/20/14   Plotnikov, Evie Lacks, MD     Family History  Problem Relation Age of Onset  . Hypertension Other   . Heart disease Mother 5       CAD  . Kidney disease Mother   . Cancer Father 6       bladder ca; passed in [place] History  . Marital status: Married    Spouse name: N/A  . Number of children: 3  . Years of education: N/A   Occupational History  . HR    Social History Main Topics  . Smoking status: Former Smoker    Packs/day: 0.25    Years: 0.50    Types: Cigarettes    Quit date: 07/03/1965  . Smokeless tobacco: Never Used  . Alcohol use No  . Drug use: No  . Sexual activity: Not on file     Comment: one pack of cigarettes would last a week   Other Topics Concern  . Not on file   Social History Narrative   Regular  exercise-no    Review of Systems  Constitutional: Negative for fatigue and fever.  Respiratory: Negative for cough and shortness of breath.   Cardiovascular: Negative for chest pain.  Gastrointestinal: Negative for abdominal pain.  Musculoskeletal: Positive for back pain.  Psychiatric/Behavioral: Negative for behavioral problems and confusion.    Vital Signs: BP (!) 154/71   Pulse 60   Temp 98.6 F (37 C) (Oral)   Resp 14   Ht 5\' 10"  (1.778 m)   Wt 220 lb (99.8 kg)   SpO2 96%   BMI 31.57 kg/m   Physical Exam  Constitutional: He is oriented to person, place, and time. He  appears well-developed.  Cardiovascular: Normal rate, regular rhythm and normal heart sounds.   Pulmonary/Chest: Effort normal and breath sounds normal.  Abdominal: Soft.  Musculoskeletal: Normal range of motion.  Neurological: He is alert and oriented to person, place, and time.  Skin: Skin is warm and dry.  Nursing note and vitals reviewed.   Imaging: No results found.  Labs:  CBC: No results for input(s): WBC, HGB, HCT, PLT in the last 8760 hours.  COAGS: No results for input(s): INR, APTT in the last 8760 hours.  BMP: No results for input(s): NA, K, CL, CO2, GLUCOSE, BUN, CALCIUM, CREATININE, GFRNONAA, GFRAA in the last 8760 hours.  Invalid input(s): CMP  LIVER FUNCTION TESTS: No results for input(s): BILITOT, AST, ALT, ALKPHOS, PROT, ALBUMIN in the last 8760 hours.  TUMOR MARKERS: No results for input(s): AFPTM, CEA, CA199, CHROMGRNA in the last 8760 hours.  Assessment and Plan: Patient with past medical history of left renal cyst previously aspirated in 2012 presents with complaint of soreness and tightness in his left flank.  Patient requested visit with Dr. Kathlene Cote to discuss recurrence and potential treatment options including aspiration or aspiration with sclerosant.   Thank you for this interesting consult.  I greatly enjoyed meeting TREVEL DILLENBECK and look forward to  participating in their care.  A copy of this report was sent to the requesting provider on this date.  Electronically Signed: Docia Barrier 10/11/2016, 11:38 AM   I spent a total of  40 Minutes   in face to face in clinical consultation, greater than 50% of which was counseling/coordinating care for left renal cyst

## 2016-10-23 ENCOUNTER — Other Ambulatory Visit: Payer: Self-pay | Admitting: Radiology

## 2016-10-24 ENCOUNTER — Other Ambulatory Visit: Payer: Self-pay | Admitting: Radiology

## 2016-10-25 ENCOUNTER — Ambulatory Visit (HOSPITAL_COMMUNITY)
Admission: RE | Admit: 2016-10-25 | Discharge: 2016-10-25 | Disposition: A | Discharge status: Home / Self Care | Payer: Medicare Other | Source: Ambulatory Visit | Attending: Interventional Radiology | Admitting: Interventional Radiology

## 2016-10-25 ENCOUNTER — Encounter (HOSPITAL_COMMUNITY): Payer: Self-pay

## 2016-10-25 DIAGNOSIS — Z7902 Long term (current) use of antithrombotics/antiplatelets: Secondary | ICD-10-CM | POA: Diagnosis not present

## 2016-10-25 DIAGNOSIS — I2510 Atherosclerotic heart disease of native coronary artery without angina pectoris: Secondary | ICD-10-CM | POA: Insufficient documentation

## 2016-10-25 DIAGNOSIS — N281 Cyst of kidney, acquired: Secondary | ICD-10-CM | POA: Diagnosis not present

## 2016-10-25 DIAGNOSIS — Z86010 Personal history of colonic polyps: Secondary | ICD-10-CM | POA: Diagnosis not present

## 2016-10-25 DIAGNOSIS — Z86718 Personal history of other venous thrombosis and embolism: Secondary | ICD-10-CM | POA: Insufficient documentation

## 2016-10-25 DIAGNOSIS — Z91013 Allergy to seafood: Secondary | ICD-10-CM | POA: Insufficient documentation

## 2016-10-25 DIAGNOSIS — I10 Essential (primary) hypertension: Secondary | ICD-10-CM | POA: Insufficient documentation

## 2016-10-25 DIAGNOSIS — Z9889 Other specified postprocedural states: Secondary | ICD-10-CM | POA: Diagnosis not present

## 2016-10-25 DIAGNOSIS — N529 Male erectile dysfunction, unspecified: Secondary | ICD-10-CM | POA: Insufficient documentation

## 2016-10-25 DIAGNOSIS — Z91041 Radiographic dye allergy status: Secondary | ICD-10-CM | POA: Insufficient documentation

## 2016-10-25 DIAGNOSIS — Z8546 Personal history of malignant neoplasm of prostate: Secondary | ICD-10-CM | POA: Diagnosis not present

## 2016-10-25 LAB — PROTIME-INR
INR: 0.99
Prothrombin Time: 13 seconds (ref 11.4–15.2)

## 2016-10-25 LAB — BASIC METABOLIC PANEL
ANION GAP: 8 (ref 5–15)
BUN: 17 mg/dL (ref 6–20)
CALCIUM: 9 mg/dL (ref 8.9–10.3)
CHLORIDE: 108 mmol/L (ref 101–111)
CO2: 23 mmol/L (ref 22–32)
CREATININE: 1.07 mg/dL (ref 0.61–1.24)
GFR calc non Af Amer: >60 mL/min (ref >60)
Glucose, Bld: 93 mg/dL (ref 65–99)
Potassium: 4 mmol/L (ref 3.5–5.1)
SODIUM: 139 mmol/L (ref 135–145)

## 2016-10-25 LAB — CBC WITH DIFFERENTIAL/PLATELET
BASOS ABS: 0 10*3/uL (ref 0.0–0.1)
Basophils Relative: 0 %
EOS PCT: 3 %
Eosinophils Absolute: 0.2 10*3/uL (ref 0.0–0.7)
HCT: 40.3 % (ref 39.0–52.0)
Hemoglobin: 13.4 g/dL (ref 13.0–17.0)
LYMPHS ABS: 1.8 10*3/uL (ref 0.7–4.0)
LYMPHS PCT: 28 %
MCH: 26.3 pg (ref 26.0–34.0)
MCHC: 33.3 g/dL (ref 30.0–36.0)
MCV: 79 fL (ref 78.0–100.0)
MONO ABS: 0.5 10*3/uL (ref 0.1–1.0)
MONOS PCT: 7 %
Neutro Abs: 4 10*3/uL (ref 1.7–7.7)
Neutrophils Relative %: 62 %
PLATELETS: 204 10*3/uL (ref 150–400)
RBC: 5.1 MIL/uL (ref 4.22–5.81)
RDW: 14.2 % (ref 11.5–15.5)
WBC: 6.4 10*3/uL (ref 4.0–10.5)

## 2016-10-25 LAB — URINALYSIS, ROUTINE W REFLEX MICROSCOPIC
Bilirubin Urine: NEGATIVE
Glucose, UA: NEGATIVE mg/dL
Hgb urine dipstick: NEGATIVE
Ketones, ur: NEGATIVE mg/dL
Leukocytes, UA: NEGATIVE
Nitrite: NEGATIVE
PROTEIN: NEGATIVE mg/dL
SPECIFIC GRAVITY, URINE: 1.018 (ref 1.005–1.030)
pH: 5 (ref 5.0–8.0)

## 2016-10-25 MED ORDER — FENTANYL CITRATE (PF) 100 MCG/2ML IJ SOLN
INTRAMUSCULAR | Status: AC | PRN
  Administered 2016-10-25: 50 ug via INTRAVENOUS

## 2016-10-25 MED ORDER — MIDAZOLAM HCL 2 MG/2ML IJ SOLN
INTRAMUSCULAR | Status: AC
  Filled 2016-10-25: qty 2

## 2016-10-25 MED ORDER — SODIUM CHLORIDE 0.9 % IV SOLN
INTRAVENOUS | Status: DC
  Administered 2016-10-25: 12:00:00 via INTRAVENOUS

## 2016-10-25 MED ORDER — MIDAZOLAM HCL 2 MG/2ML IJ SOLN
INTRAMUSCULAR | Status: AC | PRN
  Administered 2016-10-25: 1 mg via INTRAVENOUS

## 2016-10-25 MED ORDER — ACETAMINOPHEN 500 MG PO TABS
1000.0000 mg | ORAL_TABLET | Freq: Once | ORAL | Status: AC
  Administered 2016-10-25: 1000 mg via ORAL
  Filled 2016-10-25: qty 2

## 2016-10-25 MED ORDER — FENTANYL CITRATE (PF) 100 MCG/2ML IJ SOLN
INTRAMUSCULAR | Status: AC
  Filled 2016-10-25: qty 2

## 2016-10-25 MED ORDER — LIDOCAINE HCL 2 % IJ SOLN
INTRAMUSCULAR | Status: AC
  Filled 2016-10-25: qty 10

## 2016-10-25 NOTE — Progress Notes (Signed)
Call in to Dr Kathlene Cote regarding need for discharge instructions and temp post procedure.

## 2016-10-25 NOTE — H&P (Signed)
Referring Physician(s): Keene Breath  Supervising Physician: Aletta Edouard  Patient Status:  [facility]  Chief Complaint:  Symptomatic, recurrent left renal cyst  Subjective: Patient familiar to IR service from prior symptomatic left renal cyst aspiration in 2012 yielding 650 mL of clear fluid.  Cytology was negative. His symptoms were relieved for approximately 5 years. He was seen in follow-up by Dr. Kathlene Cote in the [place] clinic on 10/11/16 for reevaluation secondary to periodic left flank discomfort, most likely from recurrence of cyst. He presents today for repeat ultrasound guided left renal cyst aspiration for symptom control. He currently denies fever, headache, chest pain, dyspnea, cough, abdominal/back pain, nausea, vomiting , abnormal bleeding, dysuria/hematuria. Past Medical History:  Diagnosis Date  . Asymptomatic stenosis of left carotid artery without infarction    per duplex LICA 40-97% ([id number])  . At risk for sleep apnea    STOP-BANG= 4     SENT TO PCP 12-23-2013  . BPH with urinary obstruction    hyperplasia  . Colon polyps   . Congenital renal cyst, single   . Coronary artery disease    cardiologist--  dr Johnsie Cancel-- myoview 07-05-2010 poss. small inferior wall infact/  no ischemia/ ef 51%/  cardic CT score 28 and <50% LAD/D! disease  . Depression   . Diverticulitis   . ED (erectile dysfunction)    secondary arterial insuffiency  . Esophageal achalasia    s/p multiple dilatations and botox injection tx and surgical intervention 06-17-2011  [facility] Myotomy  . Hematospermia   . History of DVT of lower extremity    06/ 2012  . History of esophageal dilatation    and [facility] tx[facility] at [facility]  . Hypertension   . Internal hemorrhoid 2003   Dr. Henrene Pastor  . LBBB (left bundle branch block)   . LBP (low back pain)   . Nocturia   . Prostate cancer ([place])   . Wears glasses    Past Surgical History:  Procedure Laterality Date  . CARDIOVASCULAR STRESS TEST  07-05-2010  dr  Johnsie Cancel   Adenosine nuclear study/  no significant ST segment change suggestive of ischemia/  possible small inferior wall infarct at mid and basal level/ ef 51%/  LBBB/  normal wall motion  . COLONOSCOPY  last one 2009  . INGUINAL HERNIA REPAIR Left 10/13/2012   Procedure: OPEN LEFT INGUINAL HERNIA REPAIR WITH ON Q PUMP;  Surgeon: Odis Hollingshead, MD;  Location: [facility];  Service: General;  Laterality: Left;  . INSERTION OF MESH Left 10/13/2012   Procedure: INSERTION OF MESH;  Surgeon: Odis Hollingshead, MD;  Location: [facility];  Service: General;  Laterality: Left;  . LAPAROSCOPIC HELLER MYOTOMY  06-16-2001   w/ intraoperative upper endoscopy [place] (for achalasia)  . RADIOACTIVE SEED IMPLANT N/A 12/31/2013   Procedure: RADIOACTIVE SEED IMPLANT;  Surgeon: Malka So, MD;  Location: [facility];  Service: Urology;  Laterality: N/A;  seeds implanted 78 no seeds found in bladder  . TRANSTHORACIC ECHOCARDIOGRAM  07-05-2010   mild LVH/ septal motion consistent w/ LBBB/ ef 45-50%/  diffuse hypokinesis/  grade I diastolic dysfunction/ mild LAE/  trivial TR  . UMBILICAL HERNIA REPAIR  11-11-2005       Allergies: Iodinated diagnostic agents and Shellfish allergy  Medications: Prior to Admission medications   Medication Sig Start Date End Date Taking? Authorizing Provider  IRON PO Take 65 mg by mouth daily. 09/07/15  Yes [provider]  naproxen sodium (ANAPROX) 220 MG  tablet Take 220 mg by mouth 2 (two) times daily with a meal. 10/22/16  Yes [provider]  amLODipine (NORVASC) 5 MG tablet Take 1 tablet (5 mg total) by mouth daily. 07/23/16   Plotnikov, Evie Lacks, MD  apixaban (ELIQUIS) 5 MG TABS tablet Take 1 tablet (5 mg total) by mouth 2 (two) times daily. 07/23/16   Plotnikov, Evie Lacks, MD  bisacodyl (DULCOLAX) 10 MG suppository Place 1 suppository (10 mg total) rectally as needed for moderate constipation. 08/01/15   Debbe Odea, MD  bisacodyl (DULCOLAX) 5  MG EC tablet Take 1 tablet (5 mg total) by mouth daily as needed for moderate constipation. 08/01/15   Debbe Odea, MD  HYDROcodone-acetaminophen (NORCO) 5-325 MG tablet Take 1 tablet by mouth every 6 (six) hours as needed for moderate pain. 07/26/15   Plotnikov, Evie Lacks, MD  losartan-hydrochlorothiazide (HYZAAR) 100-25 MG tablet TAKE ONE TABLET BY MOUTH ONCE DAILY 06/13/15   Plotnikov, Evie Lacks, MD  Multiple Vitamin (MULTIVITAMIN) tablet Take 1 tablet by mouth daily. 09/07/15   [provider]  polyethylene glycol powder (GLYCOLAX/MIRALAX) powder Take 255 g by mouth daily as needed. 08/01/15   Debbe Odea, MD  senna (SENOKOT) 8.6 MG TABS tablet Take 1 tablet (8.6 mg total) by mouth daily as needed for mild constipation. 08/01/15   Debbe Odea, MD  terazosin (HYTRIN) 10 MG capsule Take 10 mg by mouth at bedtime.  07/23/15   [provider]  triamcinolone (KENALOG) 0.025 % ointment Apply topically 3 (three) times daily. 07/23/16   Plotnikov, Evie Lacks, MD  triamcinolone ointment (KENALOG) 0.5 % APPLY 1 APPLICATION TOPICALLY 2 TIMES DAILY 08/20/14   Plotnikov, Evie Lacks, MD     Vital Signs: BP (!) 160/71 (BP Location: Right Arm)   Pulse 63   Temp 98 F (36.7 C) (Oral)   Resp 18   SpO2 100%   Physical Exam awake, alert. Chest clear to auscultation bilaterally. Heart with regular rate and rhythm. Abdomen soft, positive bowel sounds, nontender. No lower extremity edema.  Imaging: No results found.  Labs:  CBC:  Recent Labs  10/25/16 1138  WBC 6.4  HGB 13.4  HCT 40.3  PLT 204    COAGS: No results for input(s): INR, APTT in the last 8760 hours.  BMP: No results for input(s): NA, K, CL, CO2, GLUCOSE, BUN, CALCIUM, CREATININE, GFRNONAA, GFRAA in the last 8760 hours.  Invalid input(s): CMP  LIVER FUNCTION TESTS: No results for input(s): BILITOT, AST, ALT, ALKPHOS, PROT, ALBUMIN in the last 8760 hours.  Assessment and Plan: Pt with prior symptomatic left renal  cyst aspiration in 2012 yielding 650 mL of clear fluid.  Cytology was negative. His symptoms were relieved for approximately 5 years. He was seen in follow-up by Dr. Kathlene Cote in the [place] clinic on 10/11/16 for reevaluation secondary to periodic left flank discomfort, most likely from recurrence of cyst. He presents today for repeat ultrasound guided left renal cyst aspiration for symptom control.Risks and benefits discussed with the patient /spouse including, but not limited to bleeding, infection, damage to adjacent structures or low yield requiring additional tests.All of the patient's questions were answered, patient is agreeable to proceed.Consent signed and in chart. Labs pending.     Electronically Signed: D. Rowe Robert, PA-C 10/25/2016, 12:13 PM   I spent a total of 20 minutes at the the patient's bedside AND on the patient's hospital floor or unit, greater than 50% of which was counseling/coordinating care for ultrasound-guided left renal cyst aspiration

## 2016-10-25 NOTE — Progress Notes (Signed)
Rowe Robert PA in to see pt. OK for pt to discharge home.

## 2016-10-25 NOTE — Procedures (Signed)
Interventional Radiology Procedure Note  Procedure: US guided left renal cyst aspiration   Complications: None  Estimated Blood Loss: None  5 Fr Yueh centesis catheter advanced into left renal cyst.  225 mL of clear fluid removed; cyst completely decompressed.  Venetia Night. Kathlene Cote, M.D Pager:  [phone]

## 2016-10-25 NOTE — Progress Notes (Signed)
Patient ID: Dominic Shaffer, male   DOB: January 12, 1941, 76 y.o.   MRN: [id number] Pt s/p uncomplicated left renal cyst aspiration today yielding 225 cc clear yellow fluid; on arrival to [facility] afterwards pt began having chills/low grade temp elevation; he denied sig flank pain,N/V or resp difficulties; urine was clear and UA neg, CBC this am normal ; heart was sl tachy but regular; puncture site left flank clean , dry, not sig tender,  no hematoma; pt given 2 ES tylenol and rec to push fluids; temp remained elevated to 102.9 but no change in sx's; BP stable;  pt states he has had similar episodes of temp elevation, "shakes" at home in past and attributes to his body's response to achalasia/food intake. He typically gets relief with aleve and increasing fluid intake. Above findings have been discussed with Dr. Kathlene Cote and he feels pt stable to d/c home with instructions to contact primary MD if sx's persist; pt also instructed to return to ED if status worsens such as increasing pain, n/v, bleeding, CP,dyspnea.

## 2016-10-25 NOTE — Discharge Instructions (Signed)
Needle Aspiration, Care After Refer to this sheet in the next few weeks. These instructions provide you with information about caring for yourself after your procedure. Your health care provider may also give you more specific instructions. Your treatment has been planned according to current medical practices, but problems sometimes occur. Call your health care provider if you have any problems or questions after your procedure. What can I expect after the procedure? After your procedure, it is common to have soreness, bruising, or mild pain at the biopsy site. This should go away in a few days. Follow these instructions at home:  Rest as directed by your health care provider.  Take medicines only as directed by your health care provider.  There are many different ways to close and cover the biopsy site, including stitches (sutures), skin glue, and adhesive strips. Follow your health care provider's instructions about: ? site care.keep site clean and dry ? Bandage (dressing) changes and removal In 24 hours and shower.  Check your site every day for signs of infection. Watch for: ? Redness, swelling, or pain. ? Fluid, blood, or pus. Contact a health care provider if:  You have a fever.  You have redness, swelling, or pain at the biopsy site that lasts longer than a few days.  You have fluid, blood, or pus coming from the biopsy site.  You feel nauseous.  You vomit. Get help right away if:  You have shortness of breath.  You have trouble breathing.  You have chest pain.  You feel dizzy or you faint.  You have bleeding that does not stop with pressure or a bandage.  You cough up blood.  You have pain in your abdomen. This information is not intended to replace advice given to you by your health care provider. Make sure you discuss any questions you have with your health care provider. Document Released: 06/01/2014 Document Revised: 06/23/2015 Document Reviewed:  01/11/2014 Elsevier Interactive Patient Education  2018 [place].   Moderate Conscious Sedation, Adult, Care After These instructions provide you with information about caring for yourself after your procedure. Your health care provider may also give you more specific instructions. Your treatment has been planned according to current medical practices, but problems sometimes occur. Call your health care provider if you have any problems or questions after your procedure. What can I expect after the procedure? After your procedure, it is common:  To feel sleepy for several hours.  To feel clumsy and have poor balance for several hours.  To have poor judgment for several hours.  To vomit if you eat too soon.  Follow these instructions at home: For at least 24 hours after the procedure:   Do not: ? Participate in activities where you could fall or become injured. ? Drive. ? Use heavy machinery. ? Drink alcohol. ? Take sleeping pills or medicines that cause drowsiness. ? Make important decisions or sign legal documents. ? Take care of children on your own.  Rest. Eating and drinking  Follow the diet recommended by your health care provider.  If you vomit: ? Drink water, juice, or soup when you can drink without vomiting. ? Make sure you have little or no nausea before eating solid foods. General instructions  Have a responsible adult stay with you until you are awake and alert.  Take over-the-counter and prescription medicines only as told by your health care provider.  If you smoke, do not smoke without supervision.  Keep all follow-up visits as told  by your health care provider. This is important. Contact a health care provider if:  You keep feeling nauseous or you keep vomiting.  You feel light-headed.  You develop a rash.  You have a fever. Get help right away if:  You have trouble breathing. This information is not intended to replace advice given to  you by your health care provider. Make sure you discuss any questions you have with your health care provider. Document Released: 11/05/2012 Document Revised: 06/20/2015 Document Reviewed: 05/07/2015 Elsevier Interactive Patient Education  [employer].

## 2016-11-12 ENCOUNTER — Encounter: Payer: Self-pay | Admitting: Interventional Radiology

## 2017-02-05 ENCOUNTER — Encounter: Payer: Self-pay | Admitting: Internal Medicine

## 2017-02-05 ENCOUNTER — Ambulatory Visit (INDEPENDENT_AMBULATORY_CARE_PROVIDER_SITE_OTHER)
Admission: RE | Admit: 2017-02-05 | Discharge: 2017-02-05 | Disposition: A | Discharge status: Home / Self Care | Payer: Medicare Other | Source: Ambulatory Visit | Attending: Internal Medicine | Admitting: Internal Medicine

## 2017-02-05 ENCOUNTER — Ambulatory Visit (INDEPENDENT_AMBULATORY_CARE_PROVIDER_SITE_OTHER): Payer: Medicare Other | Admitting: Internal Medicine

## 2017-02-05 DIAGNOSIS — M5410 Radiculopathy, site unspecified: Secondary | ICD-10-CM | POA: Diagnosis not present

## 2017-02-05 DIAGNOSIS — M47816 Spondylosis without myelopathy or radiculopathy, lumbar region: Secondary | ICD-10-CM | POA: Diagnosis not present

## 2017-02-05 MED ORDER — PREDNISONE 10 MG PO TABS: ORAL_TABLET | ORAL | 1 refills | Status: DC

## 2017-02-05 MED ORDER — HYDROCODONE-ACETAMINOPHEN 5-325 MG PO TABS: 1.0000 | ORAL_TABLET | Freq: Four times a day (QID) | ORAL | 0 refills | Status: DC | PRN

## 2017-02-05 NOTE — Patient Instructions (Signed)
Snowjoe shovel    Lumbosacral Radiculopathy Lumbosacral radiculopathy is a condition that involves the spinal nerves and nerve roots in the low back and bottom of the spine. The condition develops when these nerves and nerve roots move out of place or become inflamed and cause symptoms. What are the causes? This condition may be caused by:  Pressure from a disk that bulges out of place (herniated disk). A disk is a plate of cartilage that separates bones in the spine.  Disk degeneration.  A narrowing of the bones of the lower back (spinal stenosis).  A tumor.  An infection.  An injury that places sudden pressure on the disks that cushion the bones of your lower spine.  What increases the risk? This condition is more likely to develop in:  Males aged 30-50 years.  Females aged 38-60 years.  People who lift improperly.  People who are overweight or live a sedentary lifestyle.  People who smoke.  People who perform repetitive activities that strain the spine.  What are the signs or symptoms? Symptoms of this condition include:  Pain that goes down from the back into the legs (sciatica). This is the most common symptom. The pain may be worse with sitting, coughing, or sneezing.  Pain and numbness in the arms and legs.  Muscle weakness.  Tingling.  Loss of bladder control or bowel control.  How is this diagnosed? This condition is diagnosed with a physical exam and medical history. If the pain is lasting, you may have tests, such as:  MRI scan.  X-ray.  CT scan.  Myelogram.  Nerve conduction study.  How is this treated? This condition is often treated with:  Hot packs and ice applied to affected areas.  Stretches to improve flexibility.  Exercises to strengthen back muscles.  Physical therapy.  Pain medicine.  A steroid injection in the spine.  In some cases, no treatment is needed. If the condition is long-lasting (chronic), or if symptoms are  severe, treatment may involve surgery or lifestyle changes, such as following a weight loss plan. Follow these instructions at home: Medicines  Take medicines only as directed by your health care provider.  Do not drive or operate heavy machinery while taking pain medicine. Injury care  Apply a heat pack to the injured area as directed by your health care provider.  Apply ice to the affected area: ? Put ice in a plastic bag. ? Place a towel between your skin and the bag. ? Leave the ice on for 20-30 minutes, every 2 hours while you are awake or as needed. Or, leave the ice on for as long as directed by your health care provider. Other Instructions  If you were shown how to do any exercises or stretches, do them as directed by your health care provider.  If your health care provider prescribed a diet or exercise program, follow it as directed.  Keep all follow-up visits as directed by your health care provider. This is important. Contact a health care provider if:  Your pain does not improve over time even when taking pain medicines. Get help right away if:  Your develop severe pain.  Your pain suddenly gets worse.  You develop increasing weakness in your legs.  You lose the ability to control your bladder or bowel.  You have difficulty walking or balancing.  You have a fever. This information is not intended to replace advice given to you by your health care provider. Make sure you discuss any  questions you have with your health care provider. Document Released: 01/15/2005 Document Revised: 06/23/2015 Document Reviewed: 01/11/2014 Elsevier Interactive Patient Education  [employer].

## 2017-02-05 NOTE — Progress Notes (Signed)
Subjective:  Patient ID: Dominic Shaffer, male    DOB: 1940-02-15  Age: 77 y.o. MRN: [id number]  CC: No chief complaint on file.   HPI Dominic Shaffer presents for severe LBP x 5 d shooting down the LLE Worse w/walking, sitting for a long time. No numbness. Pain is bad at times. Took tylenol, Aleve - mild pain  Outpatient Medications Prior to Visit  Medication Sig Dispense Refill  . amLODipine (NORVASC) 5 MG tablet Take 1 tablet (5 mg total) by mouth daily. 90 tablet 1  . apixaban (ELIQUIS) 5 MG TABS tablet Take 1 tablet (5 mg total) by mouth 2 (two) times daily. 180 tablet 1  . HYDROcodone-acetaminophen (NORCO) 5-325 MG tablet Take 1 tablet by mouth every 6 (six) hours as needed for moderate pain. 60 tablet 0  . IRON PO Take 65 mg by mouth daily.    . Multiple Vitamin (MULTIVITAMIN) tablet Take 1 tablet by mouth daily.    . naproxen sodium (ANAPROX) 220 MG tablet Take 220 mg by mouth 2 (two) times daily with a meal.    . terazosin (HYTRIN) 10 MG capsule Take 10 mg by mouth at bedtime.     . triamcinolone (KENALOG) 0.025 % ointment Apply topically 3 (three) times daily. 80 g 1  . triamcinolone ointment (KENALOG) 0.5 % APPLY 1 APPLICATION TOPICALLY 2 TIMES DAILY 15 g 0  . bisacodyl (DULCOLAX) 10 MG suppository Place 1 suppository (10 mg total) rectally as needed for moderate constipation. (Patient not taking: Reported on 02/05/2017) 12 suppository 0  . bisacodyl (DULCOLAX) 5 MG EC tablet Take 1 tablet (5 mg total) by mouth daily as needed for moderate constipation. (Patient not taking: Reported on 02/05/2017) 30 tablet 0  . losartan-hydrochlorothiazide (HYZAAR) 100-25 MG tablet TAKE ONE TABLET BY MOUTH ONCE DAILY (Patient not taking: Reported on 02/05/2017) 90 tablet 3  . polyethylene glycol powder (GLYCOLAX/MIRALAX) powder Take 255 g by mouth daily as needed. (Patient not taking: Reported on 02/05/2017) 255 g 0  . senna (SENOKOT) 8.6 MG TABS tablet Take 1 tablet (8.6 mg total) by mouth  daily as needed for mild constipation. (Patient not taking: Reported on 02/05/2017) 120 each 0   No facility-administered medications prior to visit.     ROS Review of Systems  Constitutional: Negative for appetite change, fatigue and unexpected weight change.  HENT: Negative for congestion, nosebleeds, sneezing, sore throat and trouble swallowing.   Eyes: Negative for itching and visual disturbance.  Respiratory: Negative for cough.   Cardiovascular: Negative for chest pain, palpitations and leg swelling.  Gastrointestinal: Negative for abdominal distention, blood in stool, diarrhea and nausea.  Genitourinary: Negative for frequency and hematuria.  Musculoskeletal: Positive for back pain and gait problem. Negative for joint swelling and neck pain.  Skin: Negative for rash.  Neurological: Negative for dizziness, tremors, speech difficulty and weakness.  Psychiatric/Behavioral: Negative for agitation, dysphoric mood and sleep disturbance. The patient is not nervous/anxious.     Objective:  BP 128/64 (BP Location: Left Arm, Patient Position: Sitting, Cuff Size: Large)   Pulse 69   Temp 98.8 F (37.1 C) (Oral)   Ht 5\' 10"  (1.778 m)   Wt 221 lb (100.2 kg)   SpO2 99%   BMI 31.71 kg/m   BP Readings from Last 3 Encounters:  02/05/17 128/64  10/25/16 (!) 131/50  10/11/16 (!) 154/71    Wt Readings from Last 3 Encounters:  02/05/17 221 lb (100.2 kg)  10/11/16 220 lb (99.8 kg)  07/19/16 228 lb (103.4 kg)    Physical Exam  Constitutional: He is oriented to person, place, and time. He appears well-developed. No distress.  NAD  HENT:  Mouth/Throat: Oropharynx is clear and moist.  Eyes: Conjunctivae are normal. Pupils are equal, round, and reactive to light.  Neck: Normal range of motion. No JVD present. No thyromegaly present.  Cardiovascular: Normal rate, regular rhythm, normal heart sounds and intact distal pulses. Exam reveals no gallop and no friction rub.  No murmur  heard. Pulmonary/Chest: Effort normal and breath sounds normal. No respiratory distress. He has no wheezes. He has no rales. He exhibits no tenderness.  Abdominal: Soft. Bowel sounds are normal. He exhibits no distension and no mass. There is no tenderness. There is no rebound and no guarding.  Musculoskeletal: Normal range of motion. He exhibits tenderness. He exhibits no edema.  Lymphadenopathy:    He has no cervical adenopathy.  Neurological: He is alert and oriented to person, place, and time. He has normal reflexes. No cranial nerve deficit. He exhibits normal muscle tone. He displays a negative Romberg sign. Coordination and gait normal.  Skin: Skin is warm and dry. No rash noted.  Psychiatric: He has a normal mood and affect. His behavior is normal. Judgment and thought content normal.  str leg elev (+) on L  Lab Results  Component Value Date   WBC 6.4 10/25/2016   HGB 13.4 10/25/2016   HCT 40.3 10/25/2016   PLT 204 10/25/2016   GLUCOSE 93 10/25/2016   CHOL 141 06/04/2014   TRIG 89.0 06/04/2014   HDL 32.20 (L) 06/04/2014   LDLCALC 91 06/04/2014   ALT 13 (L) 07/30/2015   AST 14 (L) 07/30/2015   NA 139 10/25/2016   K 4.0 10/25/2016   CL 108 10/25/2016   CREATININE 1.07 10/25/2016   BUN 17 10/25/2016   CO2 23 10/25/2016   TSH 1.61 06/04/2014   PSA 1.70 04/29/2007   INR 0.99 10/25/2016    US Aspiration  Result Date: 10/25/2016 INDICATION: Recurrent symptomatic left renal cyst requiring aspiration. Status post prior decompression of the dominant left renal cyst in 2012. EXAM: ULTRASOUND-GUIDED ASPIRATION AND DRAINAGE OF LEFT RENAL CYST MEDICATIONS: None ANESTHESIA/SEDATION: Fentanyl 100 mcg IV; Versed 2.0 mg IV Moderate Sedation Time:  10 minutes. The patient was continuously monitored during the procedure by the interventional radiology nurse under my direct supervision. COMPLICATIONS: None immediate. PROCEDURE: Informed written consent was obtained from the patient after a  thorough discussion of the procedural risks, benefits and alternatives. All questions were addressed. Maximal Sterile Barrier Technique was utilized including caps, mask, sterile gowns, sterile gloves, sterile drape, hand hygiene and skin antiseptic. A timeout was performed prior to the initiation of the procedure. Local anesthesia was provided with 1% lidocaine. Ultrasound was used to localize a left renal cyst. Under direct ultrasound guidance, a 5 [place] Yueh centesis catheter was advanced into the renal cyst. Aspiration was performed. Additional ultrasound was performed after cyst decompression. FINDINGS: There is a single dominant left renal cyst measuring approximately 9 x 6 x 8 cm. Aspiration yielded 225 mL of clear fluid and resulted in complete collapse of the cyst. No other left renal cysts are identified. IMPRESSION: Ultrasound-guided aspiration and decompression of left renal cyst yielding 225 mL of clear fluid. This resulted in complete decompression of the cyst. Electronically Signed   By: Aletta Edouard M.D.   On: 10/25/2016 15:49    Assessment & Plan:   There are no diagnoses linked to  this encounter. I have discontinued Gwyndolyn Saxon A. Kitagawa's losartan-hydrochlorothiazide, senna, polyethylene glycol powder, bisacodyl, and bisacodyl. I am also having him maintain his triamcinolone ointment, terazosin, HYDROcodone-acetaminophen, IRON PO, multivitamin, amLODipine, apixaban, triamcinolone, and naproxen sodium.  No orders of the defined types were placed in this encounter.    Follow-up: No Follow-up on file.  Walker Kehr, MD

## 2017-02-05 NOTE — Assessment & Plan Note (Addendum)
L side Prednisone 10 mg: take 4 tabs a day x 3 days; then 3 tabs a day x 4 days; then 2 tabs a day x 4 days, then 1 tab a day x 6 days, then stop. Take pc. Norco prn  Potential benefits of  steroid  use as well as potential risks  and complications were explained to the patient and were aknowledged. X ray

## 2017-02-25 ENCOUNTER — Encounter: Payer: Self-pay | Admitting: Internal Medicine

## 2017-02-27 ENCOUNTER — Other Ambulatory Visit (INDEPENDENT_AMBULATORY_CARE_PROVIDER_SITE_OTHER): Payer: Medicare Other

## 2017-02-27 ENCOUNTER — Ambulatory Visit (INDEPENDENT_AMBULATORY_CARE_PROVIDER_SITE_OTHER): Payer: Medicare Other | Admitting: Internal Medicine

## 2017-02-27 ENCOUNTER — Encounter: Payer: Self-pay | Admitting: Internal Medicine

## 2017-02-27 DIAGNOSIS — C61 Malignant neoplasm of prostate: Secondary | ICD-10-CM | POA: Diagnosis not present

## 2017-02-27 DIAGNOSIS — J069 Acute upper respiratory infection, unspecified: Secondary | ICD-10-CM | POA: Diagnosis not present

## 2017-02-27 DIAGNOSIS — M5410 Radiculopathy, site unspecified: Secondary | ICD-10-CM

## 2017-02-27 LAB — BASIC METABOLIC PANEL
BUN: 17 mg/dL (ref 6–23)
CHLORIDE: 103 meq/L (ref 96–112)
CO2: 26 mEq/L (ref 19–32)
CREATININE: 1.16 mg/dL (ref 0.40–1.50)
Calcium: 8.7 mg/dL (ref 8.4–10.5)
GFR: 78.95 mL/min (ref >60.00)
Glucose, Bld: 116 mg/dL — ABNORMAL HIGH (ref 70–99)
Potassium: 3.9 mEq/L (ref 3.5–5.1)
Sodium: 139 mEq/L (ref 135–145)

## 2017-02-27 LAB — PSA: PSA: 0.02 ng/mL — ABNORMAL LOW (ref 0.10–4.00)

## 2017-02-27 MED ORDER — AZITHROMYCIN 250 MG PO TABS: ORAL_TABLET | ORAL | 0 refills | Status: DC

## 2017-02-27 NOTE — Patient Instructions (Signed)
You can use over-the-counter  "cold" medicines  such as "Afrin" nasal spray for nasal congestion as directed. Use " Delsym" or" Robitussin" cough syrup varietis for cough.  You can use plain "Tylenol" or "Advil" for fever, chills and achyness. Use Halls or Ricola cough drops.   "Common cold" symptoms are usually triggered by a virus.  The antibiotics are usually not necessary. On average, a" viral cold" illness would take 4-7 days to resolve.   Please, make an appointment if you are not better or if you're worse.  

## 2017-02-27 NOTE — Assessment & Plan Note (Addendum)
L side - not better Standing desk/mat Sit on a yoga ball at the desk Sports Med ref PSA, acid phosphatase May need an MRI LS Vit D

## 2017-02-27 NOTE — Progress Notes (Signed)
Subjective:  Patient ID: Dominic Shaffer, male    DOB: Jun 21, 1940  Age: 77 y.o. MRN: [id number]  CC: No chief complaint on file.   HPI Dominic Shaffer presents for L radiculopathy - may be 5 % better C/o ST x 3 d - worse   Outpatient Medications Prior to Visit  Medication Sig Dispense Refill  . amLODipine (NORVASC) 5 MG tablet Take 1 tablet (5 mg total) by mouth daily. 90 tablet 1  . apixaban (ELIQUIS) 5 MG TABS tablet Take 1 tablet (5 mg total) by mouth 2 (two) times daily. 180 tablet 1  . HYDROcodone-acetaminophen (NORCO) 5-325 MG tablet Take 1 tablet by mouth every 6 (six) hours as needed for severe pain. 20 tablet 0  . IRON PO Take 65 mg by mouth daily.    . Multiple Vitamin (MULTIVITAMIN) tablet Take 1 tablet by mouth daily.    . naproxen sodium (ANAPROX) 220 MG tablet Take 220 mg by mouth 2 (two) times daily with a meal.    . predniSONE (DELTASONE) 10 MG tablet Prednisone 10 mg: take 4 tabs a day x 3 days; then 3 tabs a day x 4 days; then 2 tabs a day x 4 days, then 1 tab a day x 6 days, then stop. Take pc. 38 tablet 1  . terazosin (HYTRIN) 10 MG capsule Take 10 mg by mouth at bedtime.     . triamcinolone (KENALOG) 0.025 % ointment Apply topically 3 (three) times daily. 80 g 1  . triamcinolone ointment (KENALOG) 0.5 % APPLY 1 APPLICATION TOPICALLY 2 TIMES DAILY 15 g 0   No facility-administered medications prior to visit.     ROS Review of Systems  Constitutional: Negative for appetite change, fatigue and unexpected weight change.  HENT: Negative for congestion, nosebleeds, sneezing, sore throat and trouble swallowing.   Eyes: Negative for itching and visual disturbance.  Respiratory: Negative for cough.   Cardiovascular: Negative for chest pain, palpitations and leg swelling.  Gastrointestinal: Negative for abdominal distention, blood in stool, diarrhea and nausea.  Genitourinary: Negative for frequency and hematuria.  Musculoskeletal: Positive for back pain and  gait problem. Negative for joint swelling and neck pain.  Skin: Negative for rash.  Neurological: Negative for dizziness, tremors, speech difficulty and weakness.  Psychiatric/Behavioral: Negative for agitation, dysphoric mood and sleep disturbance. The patient is not nervous/anxious.     Objective:  BP 132/70 (BP Location: Left Arm, Patient Position: Sitting, Cuff Size: Large)   Pulse 78   Temp 98.8 F (37.1 C) (Oral)   Ht 5\' 10"  (1.778 m)   Wt 217 lb (98.4 kg)   SpO2 97%   BMI 31.14 kg/m   BP Readings from Last 3 Encounters:  02/27/17 132/70  02/05/17 128/64  10/25/16 (!) 131/50    Wt Readings from Last 3 Encounters:  02/27/17 217 lb (98.4 kg)  02/05/17 221 lb (100.2 kg)  10/11/16 220 lb (99.8 kg)    Physical Exam  Constitutional: He is oriented to person, place, and time. He appears well-developed. No distress.  NAD  HENT:  Mouth/Throat: Oropharynx is clear and moist.  Eyes: Conjunctivae are normal. Pupils are equal, round, and reactive to light.  Neck: Normal range of motion. No JVD present. No thyromegaly present.  Cardiovascular: Normal rate, regular rhythm, normal heart sounds and intact distal pulses. Exam reveals no gallop and no friction rub.  No murmur heard. Pulmonary/Chest: Effort normal and breath sounds normal. No respiratory distress. He has no wheezes. He has  no rales. He exhibits no tenderness.  Abdominal: Soft. Bowel sounds are normal. He exhibits no distension and no mass. There is no tenderness. There is no rebound and no guarding.  Musculoskeletal: Normal range of motion. He exhibits no edema or tenderness.  Lymphadenopathy:    He has no cervical adenopathy.  Neurological: He is alert and oriented to person, place, and time. He has normal reflexes. No cranial nerve deficit. He exhibits normal muscle tone. He displays a negative Romberg sign. Coordination and gait normal.  Skin: Skin is warm and dry. No rash noted.  Psychiatric: He has a normal mood  and affect. His behavior is normal. Judgment and thought content normal.    Lab Results  Component Value Date   WBC 6.4 10/25/2016   HGB 13.4 10/25/2016   HCT 40.3 10/25/2016   PLT 204 10/25/2016   GLUCOSE 93 10/25/2016   CHOL 141 06/04/2014   TRIG 89.0 06/04/2014   HDL 32.20 (L) 06/04/2014   LDLCALC 91 06/04/2014   ALT 13 (L) 07/30/2015   AST 14 (L) 07/30/2015   NA 139 10/25/2016   K 4.0 10/25/2016   CL 108 10/25/2016   CREATININE 1.07 10/25/2016   BUN 17 10/25/2016   CO2 23 10/25/2016   TSH 1.61 06/04/2014   PSA 1.70 04/29/2007   INR 0.99 10/25/2016    Dg Lumbar Spine 2-3 Views  Result Date: 02/05/2017 CLINICAL DATA:  Back pain.  No known injury. EXAM: LUMBAR SPINE - 2-3 VIEW COMPARISON:  CT 08/10/2014. FINDINGS: Mild scoliosis concave right with diffuse multilevel degenerative change. Minimal 2 mm anterolisthesis noted the lowest completely segmented lumbar vertebrae. No acute bony abnormality identified. Vascular calcification noted. IMPRESSION: Mild scoliosis concave right with diffuse multilevel degenerative change. Minimal anterolisthesis of 2 mm of the lowest completely segmented lumbar vertebral body. No significant anterolisthesis noted. No acute bony abnormality. Electronically Signed   By: Marcello Moores  Register   On: 02/05/2017 15:49    Assessment & Plan:   There are no diagnoses linked to this encounter. I am having Dominic Shaffer maintain his triamcinolone ointment, terazosin, IRON PO, multivitamin, amLODipine, apixaban, triamcinolone, naproxen sodium, predniSONE, and HYDROcodone-acetaminophen.  No orders of the defined types were placed in this encounter.    Follow-up: No Follow-up on file.  Walker Kehr, MD

## 2017-02-27 NOTE — Assessment & Plan Note (Signed)
zpac if worse 

## 2017-02-27 NOTE — Assessment & Plan Note (Signed)
He will probably need an MRI LS PSA, acid phosphatase

## 2017-02-28 DIAGNOSIS — Z0279 Encounter for issue of other medical certificate: Secondary | ICD-10-CM

## 2017-03-01 ENCOUNTER — Other Ambulatory Visit: Payer: Medicare Other

## 2017-03-01 DIAGNOSIS — M5410 Radiculopathy, site unspecified: Secondary | ICD-10-CM | POA: Diagnosis not present

## 2017-03-04 ENCOUNTER — Ambulatory Visit: Payer: Medicare Other | Admitting: Internal Medicine

## 2017-03-06 LAB — ACID PHOSPHATASE, TOTAL: PROSTATIC ACID PHOSPHATE(PAP): 1.3 ng/mL (ref <2.8)

## 2017-03-11 ENCOUNTER — Ambulatory Visit: Payer: Medicare Other | Admitting: Internal Medicine

## 2017-03-13 NOTE — Progress Notes (Signed)
[facility] [address] [place], [place] [address] Phone: [phone] Subjective:    I'm seeing this patient by the request  of:    CC: Back pain  CBJ:[id number]  Dominic Shaffer is a 77 y.o. male coming in with complaint of bilateral back pain.  Started on the left side. Dr. Camila Li. Diagnosed it as sciatic nerve pain. Mid back to hip to leg. A week ago he felt right sided pain. Most painful in the morning. Relief with hip flexion. Does get some tingling and thigh cramps in the back of his legs and medial. Has history of blood clots.   Onset- Chronic Location- Lower Duration- Throughout the day, difficult in the morning and sleeping at night Character- Sharp, achy with sitting Aggravating factors- stair, sitting Reliving factors- stretching Therapies tried- Blood thinners Severity-6 out of 10 but worsening   Patient did have back pain patient did have x-rays taken February 05, 2017.  Found to have mild scoliosis with diffuse multilevel degenerative changes.  Independently visualized by me.  Past Medical History:  Diagnosis Date  . Asymptomatic stenosis of left carotid artery without infarction    per duplex LICA 60-73% ([id number])  . At risk for sleep apnea    STOP-BANG= 4     SENT TO PCP 12-23-2013  . BPH with urinary obstruction    hyperplasia  . Colon polyps   . Congenital renal cyst, single   . Coronary artery disease    cardiologist--  dr Johnsie Cancel-- myoview 07-05-2010 poss. small inferior wall infact/  no ischemia/ ef 51%/  cardic CT score 28 and <50% LAD/D! disease  . Depression   . Diverticulitis   . ED (erectile dysfunction)    secondary arterial insuffiency  . Esophageal achalasia    s/p multiple dilatations and botox injection tx and surgical intervention 06-17-2011  [facility] Myotomy  . Hematospermia   . History of DVT of lower extremity    06/ 2012  . History of esophageal dilatation    and botox injection tx's at [facility]  .  Hypertension   . Internal hemorrhoid 2003   Dr. Henrene Pastor  . LBBB (left bundle branch block)   . LBP (low back pain)   . Nocturia   . Prostate cancer ([place])   . Wears glasses    Past Surgical History:  Procedure Laterality Date  . CARDIOVASCULAR STRESS TEST  07-05-2010  dr Johnsie Cancel   Adenosine nuclear study/  no significant ST segment change suggestive of ischemia/  possible small inferior wall infarct at mid and basal level/ ef 51%/  LBBB/  normal wall motion  . COLONOSCOPY  last one 2009  . INGUINAL HERNIA REPAIR Left 10/13/2012   Procedure: OPEN LEFT INGUINAL HERNIA REPAIR WITH ON Q PUMP;  Surgeon: Odis Hollingshead, MD;  Location: [facility];  Service: General;  Laterality: Left;  . INSERTION OF MESH Left 10/13/2012   Procedure: INSERTION OF MESH;  Surgeon: Odis Hollingshead, MD;  Location: [facility];  Service: General;  Laterality: Left;  . IR RADIOLOGIST EVAL & MGMT  10/11/2016  . LAPAROSCOPIC HELLER MYOTOMY  06-16-2001   w/ intraoperative upper endoscopy [place] (for achalasia)  . RADIOACTIVE SEED IMPLANT N/A 12/31/2013   Procedure: RADIOACTIVE SEED IMPLANT;  Surgeon: Malka So, MD;  Location: [facility];  Service: Urology;  Laterality: N/A;  seeds implanted 78 no seeds found in bladder  . TRANSTHORACIC ECHOCARDIOGRAM  07-05-2010   mild LVH/ septal motion consistent w/  LBBB/ ef 45-50%/  diffuse hypokinesis/  grade I diastolic dysfunction/ mild LAE/  trivial TR  . UMBILICAL HERNIA REPAIR  11-11-2005   Social History   Socioeconomic History  . Marital status: Married    Spouse name: None  . Number of children: 3  . Years of education: None  . Highest education level: None  Social Needs  . Financial resource strain: None  . Food insecurity - worry: None  . Food insecurity - inability: None  . Transportation needs - medical: None  . Transportation needs - non-medical: None  Occupational History  . Occupation: HR  Tobacco Use  . Smoking status: Former Smoker     Packs/day: 0.25    Years: 0.50    Pack years: 0.12    Types: Cigarettes    Last attempt to quit: 07/03/1965    Years since quitting: 51.7  . Smokeless tobacco: Never Used  Substance and Sexual Activity  . Alcohol use: No  . Drug use: No  . Sexual activity: None    Comment: one pack of cigarettes would last a week  Other Topics Concern  . None  Social History Narrative   Regular exercise-no   Allergies  Allergen Reactions  . Iodinated Diagnostic Agents Hives  . Shellfish Allergy Hives    MAINLY -SHRIMP   Family History  Problem Relation Age of Onset  . Heart disease Mother 49       CAD  . Kidney disease Mother   . Cancer Father 1       bladder ca; passed in 1999  . Hypertension Other      Past medical history, social, surgical and family history all reviewed in electronic medical record.  No pertanent information unless stated regarding to the chief complaint.   Review of Systems:Review of systems updated and as accurate as of 03/14/17  No headache, visual changes, nausea, vomiting, diarrhea, constipation, dizziness, abdominal pain, skin rash, fevers, chills, night sweats, weight loss, swollen lymph nodes, body aches, joint swelling, muscle aches, chest pain, shortness of breath, mood changes.  Positive muscle aches  Objective  Blood pressure (!) 150/74, pulse 74, height 5\' 10"  (1.778 m), weight 219 lb (99.3 kg), SpO2 97 %. Systems examined below as of 03/14/17   General: No apparent distress alert and oriented x3 mood and affect normal, dressed appropriately.  HEENT: Pupils equal, extraocular movements intact  Respiratory: Patient's speak in full sentences and does not appear short of breath  Cardiovascular: No lower extremity edema, non tender, no erythema  Skin: Warm dry intact with no signs of infection or rash on extremities or on axial skeleton.  Abdomen: Soft nontender  Neuro: Cranial nerves II through XII are intact, neurovascularly intact in all extremities  with 2+ DTRs and 2+ pulses.  Lymph: No lymphadenopathy of posterior or anterior cervical chain or axillae bilaterally.  Gait normal with good balance and coordination.  MSK:  Non tender with full range of motion and good stability and symmetric strength and tone of shoulders, elbows, wrist, hip, knee and ankles bilaterally.  Mild arthritic changes of multiple joints Back Exam:  Inspection: Loss of lordosis with some mild degenerative scoliosis Motion: Flexion 40 deg, Extension 25 deg, Side Bending to 35 deg bilaterally,  Rotation to 35 deg bilaterally  SLR laying: Negative  XSLR laying: Negative  Palpable tenderness: Tender to palpation the paraspinal musculature lumbar spine right greater than left. FABER: Positive Faber right tightness on the left as well. Sensory change: Gross sensation intact  to all lumbar and sacral dermatomes.  Reflexes: 2+ at both patellar tendons, 2+ at achilles tendons, Babinski's downgoing.  Strength at foot  Plantar-flexion: 5/5 Dorsi-flexion: 5/5 Eversion: 5/5 Inversion: 5/5  Leg strength  4 out of 5 but symmetric  [id number]; 15 additional minutes spent for Therapeutic exercises as stated in above notes.  This included exercises focusing on stretching, strengthening, with significant focus on eccentric aspects.   Long term goals include an improvement in range of motion, strength, endurance as well as avoiding reinjury. Patient's frequency would include in 1-2 times a day, 3-5 times a week for a duration of 6-12 weeks. Low back exercises that included:  Pelvic tilt/bracing instruction to focus on control of the pelvic girdle and lower abdominal muscles  Glute strengthening exercises, focusing on proper firing of the glutes without engaging the low back muscles Proper stretching techniques for maximum relief for the hamstrings, hip flexors, low back and some rotation where tolerated   Proper technique shown and discussed handout in great detail with ATC.  All questions  were discussed and answered.      Impression and Recommendations:     This case required medical decision making of moderate complexity.      Note: This dictation was prepared with Dragon dictation along with smaller phrase technology. Any transcriptional errors that result from this process are unintentional.

## 2017-03-14 ENCOUNTER — Telehealth: Payer: Self-pay | Admitting: Internal Medicine

## 2017-03-14 ENCOUNTER — Encounter: Payer: Self-pay | Admitting: Family Medicine

## 2017-03-14 ENCOUNTER — Ambulatory Visit (INDEPENDENT_AMBULATORY_CARE_PROVIDER_SITE_OTHER): Payer: Medicare Other | Admitting: Family Medicine

## 2017-03-14 DIAGNOSIS — M5136 Other intervertebral disc degeneration, lumbar region: Secondary | ICD-10-CM

## 2017-03-14 MED ORDER — GABAPENTIN 100 MG PO CAPS: 200.0000 mg | ORAL_CAPSULE | Freq: Every day | ORAL | 3 refills | Status: DC

## 2017-03-14 NOTE — Telephone Encounter (Signed)
Copied from [place]. Topic: Quick Communication - See Telephone Encounter >> Mar 14, 2017  3:59 PM Oneta Rack wrote: Relation to pt: self Call back number: [phone]    Reason for call:  Patient was seen today and states PCP was going to prescribe samples of pennsaid pinkie ointment and would like samples left at the front desk and will pick up Friday, please advise

## 2017-03-14 NOTE — Assessment & Plan Note (Signed)
I believe the patient does have degenerative disc disease and does have likely spinal stenosis causing neurogenic claudication.  We discussed with patient in great length with home exercises.  Discussed icing regimen, which activities to do which wants to avoid.  Patient given gabapentin for nighttime relief.  Patient will try this.  Follow-up in 4 weeks.  Could be a candidate for epidurals or if worsening weakness will consider the possibility of advanced imaging especially with history of prostate cancer.

## 2017-03-14 NOTE — Patient Instructions (Signed)
Good to see you  I think you have spinal stenosis  Ice 20 minutes 2 times daily. Usually after activity and before bed. Exercises 3 times a week.  pennsaid pinkie amount topically 2 times daily as needed.  Gabapentin 200mg  at night Over the counter get  Vitamin D 2000 IU dialy  Tart cherry extract any dose at night Stay active See em again in 4 week s

## 2017-03-14 NOTE — Telephone Encounter (Signed)
Patient did not see PCP, he saw sports med, Dr.Smith.

## 2017-03-15 NOTE — Telephone Encounter (Signed)
lmovm for pt to return call.  Unfortunately, this insurance does not cover pennsaid. We can continue to give him samples when needed.

## 2017-04-10 NOTE — Progress Notes (Signed)
[facility] [address] [place], [place] [address] Phone: [phone] Subjective:     CC: Back pain follow-up  HWE:[id number]  Dominic Shaffer is a 77 y.o. male coming in with complaint of neck pain follow-up.  Concern for potential spinal stenosis.  Given gabapentin was to do home exercises.  Patient states he is doing about 90% better.  Not having as much pain.  Patient states no significant radiation down the leg.  Some mild discomfort with certain movements but nothing that stops him from activity.  Feels the exercises and the medications have been very helpful.   Patient did have x-rays of the lumbar spine taken February 05, 2017.  Independently visualized by me showing diffuse multilevel degenerative changes moderate to severe  Past Medical History:  Diagnosis Date  . Asymptomatic stenosis of left carotid artery without infarction    per duplex LICA 93-81% ([id number])  . At risk for sleep apnea    STOP-BANG= 4     SENT TO PCP 12-23-2013  . BPH with urinary obstruction    hyperplasia  . Colon polyps   . Congenital renal cyst, single   . Coronary artery disease    cardiologist--  dr Johnsie Cancel-- myoview 07-05-2010 poss. small inferior wall infact/  no ischemia/ ef 51%/  cardic CT score 28 and <50% LAD/D! disease  . Depression   . Diverticulitis   . ED (erectile dysfunction)    secondary arterial insuffiency  . Esophageal achalasia    s/p multiple dilatations and botox injection tx and surgical intervention 06-17-2011  [facility] Myotomy  . Hematospermia   . History of DVT of lower extremity    06/ 2012  . History of esophageal dilatation    and botox injection tx's at [facility]  . Hypertension   . Internal hemorrhoid 2003   Dr. Henrene Pastor  . LBBB (left bundle branch block)   . LBP (low back pain)   . Nocturia   . Prostate cancer ([place])   . Wears glasses    Past Surgical History:  Procedure Laterality Date  . CARDIOVASCULAR STRESS TEST  07-05-2010   dr Johnsie Cancel   Adenosine nuclear study/  no significant ST segment change suggestive of ischemia/  possible small inferior wall infarct at mid and basal level/ ef 51%/  LBBB/  normal wall motion  . COLONOSCOPY  last one 2009  . INGUINAL HERNIA REPAIR Left 10/13/2012   Procedure: OPEN LEFT INGUINAL HERNIA REPAIR WITH ON Q PUMP;  Surgeon: Odis Hollingshead, MD;  Location: [facility];  Service: General;  Laterality: Left;  . INSERTION OF MESH Left 10/13/2012   Procedure: INSERTION OF MESH;  Surgeon: Odis Hollingshead, MD;  Location: [facility];  Service: General;  Laterality: Left;  . IR RADIOLOGIST EVAL & MGMT  10/11/2016  . LAPAROSCOPIC HELLER MYOTOMY  06-16-2001   w/ intraoperative upper endoscopy [place] (for achalasia)  . RADIOACTIVE SEED IMPLANT N/A 12/31/2013   Procedure: RADIOACTIVE SEED IMPLANT;  Surgeon: Malka So, MD;  Location: [facility];  Service: Urology;  Laterality: N/A;  seeds implanted 78 no seeds found in bladder  . TRANSTHORACIC ECHOCARDIOGRAM  07-05-2010   mild LVH/ septal motion consistent w/ LBBB/ ef 45-50%/  diffuse hypokinesis/  grade I diastolic dysfunction/ mild LAE/  trivial TR  . UMBILICAL HERNIA REPAIR  11-11-2005   Social History   Socioeconomic History  . Marital status: Married    Spouse name: Not on file  . Number of  children: 3  . Years of education: Not on file  . Highest education level: Not on file  Social Needs  . Financial resource strain: Not on file  . Food insecurity - worry: Not on file  . Food insecurity - inability: Not on file  . Transportation needs - medical: Not on file  . Transportation needs - non-medical: Not on file  Occupational History  . Occupation: HR  Tobacco Use  . Smoking status: Former Smoker    Packs/day: 0.25    Years: 0.50    Pack years: 0.12    Types: Cigarettes    Last attempt to quit: 07/03/1965    Years since quitting: 51.8  . Smokeless tobacco: Never Used  Substance and Sexual Activity  . Alcohol use: No   . Drug use: No  . Sexual activity: Not on file    Comment: one pack of cigarettes would last a week  Other Topics Concern  . Not on file  Social History Narrative   Regular exercise-no   Allergies  Allergen Reactions  . Iodinated Diagnostic Agents Hives  . Shellfish Allergy Hives    MAINLY -SHRIMP   Family History  Problem Relation Age of Onset  . Heart disease Mother 34       CAD  . Kidney disease Mother   . Cancer Father 53       bladder ca; passed in 1999  . Hypertension Other      Past medical history, social, surgical and family history all reviewed in electronic medical record.  No pertanent information unless stated regarding to the chief complaint.   Review of Systems:Review of systems updated and as accurate as of 04/10/17  No headache, visual changes, nausea, vomiting, diarrhea, constipation, dizziness, abdominal pain, skin rash, fevers, chills, night sweats, weight loss, swollen lymph nodes, body aches, joint swelling, muscle aches, chest pain, shortness of breath, mood changes.   Objective  There were no vitals taken for this visit. Systems examined below as of 04/10/17   General: No apparent distress alert and oriented x3 mood and affect normal, dressed appropriately.  HEENT: Pupils equal, extraocular movements intact  Respiratory: Patient's speak in full sentences and does not appear short of breath  Cardiovascular: No lower extremity edema, non tender, no erythema  Skin: Warm dry intact with no signs of infection or rash on extremities or on axial skeleton.  Abdomen: Soft nontender  Neuro: Cranial nerves II through XII are intact, neurovascularly intact in all extremities with 2+ DTRs and 2+ pulses.  Lymph: No lymphadenopathy of posterior or anterior cervical chain or axillae bilaterally.  Gait normal with good balance and coordination.  Mild shuffling MSK:  Non tender with full range of motion and good stability and symmetric strength and tone of  shoulders, elbows, wrist, hip, knee and ankles bilaterally.  Arthritic changes of multiple joints Lumbar spine exam shows the patient still has some limitation in range of motion in all planes.  Patient does have some tightness of Corky Sox but improved.  Negative straight leg test bilaterally.  Neurovascularly intact distally.  Deep tendon reflexes intact.    Impression and Recommendations:     This case required medical decision making of moderate complexity.      Note: This dictation was prepared with Dragon dictation along with smaller phrase technology. Any transcriptional errors that result from this process are unintentional.

## 2017-04-11 ENCOUNTER — Ambulatory Visit (INDEPENDENT_AMBULATORY_CARE_PROVIDER_SITE_OTHER): Payer: Medicare Other | Admitting: Family Medicine

## 2017-04-11 ENCOUNTER — Encounter: Payer: Self-pay | Admitting: Family Medicine

## 2017-04-11 DIAGNOSIS — M5136 Other intervertebral disc degeneration, lumbar region: Secondary | ICD-10-CM | POA: Diagnosis not present

## 2017-04-11 NOTE — Assessment & Plan Note (Signed)
Patient does have degenerative disc disease at multiple levels of the lumbar spine.  Discussed icing regimen and home exercises.  Discussed which activities to do which wants to avoid.  Patient is to increase activity slowly over the course the next several weeks.  Patient will follow up with me again 2 months.

## 2017-04-11 NOTE — Patient Instructions (Signed)
Good to see you  Dominic Shaffer is your friend.cointinue the exercises 2 times a week  I am proud of you  Continue the vitamins See me again in 2 months if not perfect  Otherwise see me when you need me

## 2017-04-29 ENCOUNTER — Other Ambulatory Visit: Payer: Self-pay | Admitting: Internal Medicine

## 2017-05-28 ENCOUNTER — Ambulatory Visit (INDEPENDENT_AMBULATORY_CARE_PROVIDER_SITE_OTHER): Payer: Medicare Other | Admitting: Internal Medicine

## 2017-05-28 ENCOUNTER — Encounter: Payer: Self-pay | Admitting: Internal Medicine

## 2017-05-28 DIAGNOSIS — H9193 Unspecified hearing loss, bilateral: Secondary | ICD-10-CM

## 2017-05-28 DIAGNOSIS — M722 Plantar fascial fibromatosis: Secondary | ICD-10-CM | POA: Diagnosis not present

## 2017-05-28 DIAGNOSIS — M5410 Radiculopathy, site unspecified: Secondary | ICD-10-CM | POA: Diagnosis not present

## 2017-05-28 DIAGNOSIS — R600 Localized edema: Secondary | ICD-10-CM

## 2017-05-28 NOTE — Progress Notes (Signed)
Subjective:  Patient ID: Dominic Shaffer, male    DOB: Apr 15, 1940  Age: 77 y.o. MRN: [id number]  CC: No chief complaint on file.   HPI Dominic Shaffer presents for LBP f/u - better C/o R heel pain F/u HTN  Outpatient Medications Prior to Visit  Medication Sig Dispense Refill  . amLODipine (NORVASC) 5 MG tablet TAKE 1 TABLET (5 MG TOTAL) BY MOUTH DAILY. 90 tablet 1  . apixaban (ELIQUIS) 5 MG TABS tablet Take 1 tablet (5 mg total) by mouth 2 (two) times daily. 180 tablet 1  . azithromycin (ZITHROMAX Z-PAK) 250 MG tablet As directed 6 each 0  . gabapentin (NEURONTIN) 100 MG capsule Take 2 capsules (200 mg total) by mouth at bedtime. 60 capsule 3  . HYDROcodone-acetaminophen (NORCO) 5-325 MG tablet Take 1 tablet by mouth every 6 (six) hours as needed for severe pain. 20 tablet 0  . IRON PO Take 65 mg by mouth daily.    . Multiple Vitamin (MULTIVITAMIN) tablet Take 1 tablet by mouth daily.    . naproxen sodium (ANAPROX) 220 MG tablet Take 220 mg by mouth 2 (two) times daily with a meal.    . predniSONE (DELTASONE) 10 MG tablet Prednisone 10 mg: take 4 tabs a day x 3 days; then 3 tabs a day x 4 days; then 2 tabs a day x 4 days, then 1 tab a day x 6 days, then stop. Take pc. 38 tablet 1  . terazosin (HYTRIN) 10 MG capsule Take 10 mg by mouth at bedtime.     . triamcinolone (KENALOG) 0.025 % ointment Apply topically 3 (three) times daily. 80 g 1  . triamcinolone ointment (KENALOG) 0.5 % APPLY 1 APPLICATION TOPICALLY 2 TIMES DAILY 15 g 0   No facility-administered medications prior to visit.     ROS Review of Systems  Constitutional: Negative for appetite change, fatigue and unexpected weight change.  HENT: Negative for congestion, nosebleeds, sneezing, sore throat and trouble swallowing.   Eyes: Negative for itching and visual disturbance.  Respiratory: Negative for cough.   Cardiovascular: Negative for chest pain, palpitations and leg swelling.  Gastrointestinal: Negative for  abdominal distention, blood in stool, diarrhea and nausea.  Genitourinary: Negative for frequency and hematuria.  Musculoskeletal: Positive for back pain and gait problem. Negative for joint swelling and neck pain.  Skin: Negative for rash.  Neurological: Negative for dizziness, tremors, speech difficulty and weakness.  Psychiatric/Behavioral: Negative for agitation, dysphoric mood and sleep disturbance. The patient is not nervous/anxious.     Objective:  BP 132/64 (BP Location: Left Arm, Patient Position: Sitting, Cuff Size: Large)   Pulse (!) 59   Temp 98.5 F (36.9 C) (Oral)   Ht 5\' 10"  (1.778 m)   Wt 219 lb (99.3 kg)   SpO2 98%   BMI 31.42 kg/m   BP Readings from Last 3 Encounters:  05/28/17 132/64  04/11/17 140/60  03/14/17 (!) 150/74    Wt Readings from Last 3 Encounters:  05/28/17 219 lb (99.3 kg)  04/11/17 220 lb (99.8 kg)  03/14/17 219 lb (99.3 kg)    Physical Exam  Constitutional: He is oriented to person, place, and time. He appears well-developed. No distress.  NAD  HENT:  Mouth/Throat: Oropharynx is clear and moist.  Eyes: Pupils are equal, round, and reactive to light. Conjunctivae are normal.  Neck: Normal range of motion. No JVD present. No thyromegaly present.  Cardiovascular: Normal rate, regular rhythm, normal heart sounds and intact distal pulses.  Exam reveals no gallop and no friction rub.  No murmur heard. Pulmonary/Chest: Effort normal and breath sounds normal. No respiratory distress. He has no wheezes. He has no rales. He exhibits no tenderness.  Abdominal: Soft. Bowel sounds are normal. He exhibits no distension and no mass. There is no tenderness. There is no rebound and no guarding.  Musculoskeletal: Normal range of motion. He exhibits tenderness. He exhibits no edema.  Lymphadenopathy:    He has no cervical adenopathy.  Neurological: He is alert and oriented to person, place, and time. He has normal reflexes. No cranial nerve deficit. He  exhibits normal muscle tone. He displays a negative Romberg sign. Coordination and gait normal.  Skin: Skin is warm and dry. No rash noted.  Psychiatric: He has a normal mood and affect. His behavior is normal. Judgment and thought content normal.   R heel - painful   Lab Results  Component Value Date   WBC 6.4 10/25/2016   HGB 13.4 10/25/2016   HCT 40.3 10/25/2016   PLT 204 10/25/2016   GLUCOSE 116 (H) 02/27/2017   CHOL 141 06/04/2014   TRIG 89.0 06/04/2014   HDL 32.20 (L) 06/04/2014   LDLCALC 91 06/04/2014   ALT 13 (L) 07/30/2015   AST 14 (L) 07/30/2015   NA 139 02/27/2017   K 3.9 02/27/2017   CL 103 02/27/2017   CREATININE 1.16 02/27/2017   BUN 17 02/27/2017   CO2 26 02/27/2017   TSH 1.61 06/04/2014   PSA 0.02 (L) 02/27/2017   INR 0.99 10/25/2016    Dg Lumbar Spine 2-3 Views  Result Date: 02/05/2017 CLINICAL DATA:  Back pain.  No known injury. EXAM: LUMBAR SPINE - 2-3 VIEW COMPARISON:  CT 08/10/2014. FINDINGS: Mild scoliosis concave right with diffuse multilevel degenerative change. Minimal 2 mm anterolisthesis noted the lowest completely segmented lumbar vertebrae. No acute bony abnormality identified. Vascular calcification noted. IMPRESSION: Mild scoliosis concave right with diffuse multilevel degenerative change. Minimal anterolisthesis of 2 mm of the lowest completely segmented lumbar vertebral body. No significant anterolisthesis noted. No acute bony abnormality. Electronically Signed   By: Marcello Moores  Register   On: 02/05/2017 15:49    Assessment & Plan:   There are no diagnoses linked to this encounter. I am having Dominic Shaffer maintain his triamcinolone ointment, terazosin, IRON PO, multivitamin, apixaban, triamcinolone, naproxen sodium, predniSONE, HYDROcodone-acetaminophen, azithromycin, gabapentin, and amLODipine.  No orders of the defined types were placed in this encounter.    Follow-up: No follow-ups on file.  Walker Kehr, MD

## 2017-05-28 NOTE — Assessment & Plan Note (Signed)
Hearing aids 

## 2017-05-28 NOTE — Patient Instructions (Signed)
[employer] "total support" online   Good shoes with rigid bottom: Eugenie Birks or New balance greater then 700     Plantar Fasciitis   Plantar fasciitis is a painful foot condition that affects the heel. It occurs when the band of tissue that connects the toes to the heel bone (plantar fascia) becomes irritated. This can happen after exercising too much or doing other repetitive activities (overuse injury). The pain from plantar fasciitis can range from mild irritation to severe pain that makes it difficult for you to walk or move. The pain is usually worse in the morning or after you have been sitting or lying down for a while. What are the causes? This condition may be caused by:  Standing for long periods of time.  Wearing shoes that do not fit.  Doing high-impact activities, including running, aerobics, and ballet.  Being overweight.  Having an abnormal way of walking (gait).  Having tight calf muscles.  Having high arches in your feet.  Starting a new athletic activity.  What are the signs or symptoms? The main symptom of this condition is heel pain. Other symptoms include:  Pain that gets worse after activity or exercise.  Pain that is worse in the morning or after resting.  Pain that goes away after you walk for a few minutes.  How is this diagnosed? This condition may be diagnosed based on your signs and symptoms. Your health care provider will also do a physical exam to check for:  A tender area on the bottom of your foot.  A high arch in your foot.  Pain when you move your foot.  Difficulty moving your foot.  You may also need to have imaging studies to confirm the diagnosis. These can include:  X-rays.  Ultrasound.  MRI.  How is this treated? Treatment for plantar fasciitis depends on the severity of the condition. Your treatment may include:  Rest, ice, and over-the-counter pain medicines to manage your pain.  Exercises to  stretch your calves and your plantar fascia.  A splint that holds your foot in a stretched, upward position while you sleep (night splint).  Physical therapy to relieve symptoms and prevent problems in the future.  Cortisone injections to relieve severe pain.  Extracorporeal shock wave therapy (ESWT) to stimulate damaged plantar fascia with electrical impulses. It is often used as a last resort before surgery.  Surgery, if other treatments have not worked after 12 months.  Follow these instructions at home:  Take medicines only as directed by your health care provider.  Avoid activities that cause pain.  Roll the bottom of your foot over a bag of ice or a bottle of cold water. Do this for 20 minutes, 3-4 times a day.  Perform simple stretches as directed by your health care provider.  Try wearing athletic shoes with air-sole or gel-sole cushions or soft shoe inserts.  Wear a night splint while sleeping, if directed by your health care provider.  Keep all follow-up appointments with your health care provider. How is this prevented?  Do not perform exercises or activities that cause heel pain.  Consider finding low-impact activities if you continue to have problems.  Lose weight if you need to. The best way to prevent plantar fasciitis is to avoid the activities that aggravate your plantar fascia. Contact a health care provider if:  Your symptoms do not go away after treatment with home care measures.  Your pain gets worse.  Your pain affects your  ability to move or do your daily activities. This information is not intended to replace advice given to you by your health care provider. Make sure you discuss any questions you have with your health care provider. Document Released: 10/10/2000 Document Revised: 06/20/2015 Document Reviewed: 11/25/2013 Elsevier Interactive Patient Education  [employer].

## 2017-05-28 NOTE — Assessment & Plan Note (Signed)
Better  

## 2017-05-28 NOTE — Assessment & Plan Note (Addendum)
Discussed Rx NSAIDs prn do not use Tylenol prn

## 2017-06-07 ENCOUNTER — Encounter: Payer: Self-pay | Admitting: Internal Medicine

## 2017-07-22 ENCOUNTER — Other Ambulatory Visit: Payer: Self-pay | Admitting: Internal Medicine

## 2017-07-23 ENCOUNTER — Other Ambulatory Visit: Payer: Self-pay | Admitting: Internal Medicine

## 2017-07-26 ENCOUNTER — Other Ambulatory Visit: Payer: Self-pay | Admitting: Internal Medicine

## 2017-07-29 ENCOUNTER — Other Ambulatory Visit: Payer: Self-pay | Admitting: Internal Medicine

## 2017-08-13 ENCOUNTER — Other Ambulatory Visit (INDEPENDENT_AMBULATORY_CARE_PROVIDER_SITE_OTHER): Payer: Medicare Other

## 2017-08-13 ENCOUNTER — Ambulatory Visit (INDEPENDENT_AMBULATORY_CARE_PROVIDER_SITE_OTHER): Payer: Medicare Other | Admitting: Internal Medicine

## 2017-08-13 ENCOUNTER — Encounter: Payer: Self-pay | Admitting: Internal Medicine

## 2017-08-13 DIAGNOSIS — Z5181 Encounter for therapeutic drug level monitoring: Secondary | ICD-10-CM | POA: Diagnosis not present

## 2017-08-13 DIAGNOSIS — C61 Malignant neoplasm of prostate: Secondary | ICD-10-CM | POA: Diagnosis not present

## 2017-08-13 DIAGNOSIS — F411 Generalized anxiety disorder: Secondary | ICD-10-CM | POA: Diagnosis not present

## 2017-08-13 DIAGNOSIS — I10 Essential (primary) hypertension: Secondary | ICD-10-CM | POA: Diagnosis not present

## 2017-08-13 DIAGNOSIS — Z7901 Long term (current) use of anticoagulants: Secondary | ICD-10-CM | POA: Diagnosis not present

## 2017-08-13 DIAGNOSIS — I872 Venous insufficiency (chronic) (peripheral): Secondary | ICD-10-CM

## 2017-08-13 DIAGNOSIS — K4091 Unilateral inguinal hernia, without obstruction or gangrene, recurrent: Secondary | ICD-10-CM

## 2017-08-13 LAB — PSA: PSA: 0.01 ng/mL — AB (ref 0.10–4.00)

## 2017-08-13 LAB — HEPATIC FUNCTION PANEL
ALBUMIN: 3.7 g/dL (ref 3.5–5.2)
ALT: 11 U/L (ref 0–53)
AST: 10 U/L (ref 0–37)
Alkaline Phosphatase: 86 U/L (ref 39–117)
Bilirubin, Direct: 0.1 mg/dL (ref 0.0–0.3)
Total Bilirubin: 0.5 mg/dL (ref 0.2–1.2)
Total Protein: 6.8 g/dL (ref 6.0–8.3)

## 2017-08-13 LAB — BASIC METABOLIC PANEL
BUN: 14 mg/dL (ref 6–23)
CO2: 27 mEq/L (ref 19–32)
Calcium: 8.8 mg/dL (ref 8.4–10.5)
Chloride: 106 mEq/L (ref 96–112)
Creatinine, Ser: 1.01 mg/dL (ref 0.40–1.50)
GFR: 92.52 mL/min (ref >60.00)
Glucose, Bld: 97 mg/dL (ref 70–99)
POTASSIUM: 4 meq/L (ref 3.5–5.1)
Sodium: 139 mEq/L (ref 135–145)

## 2017-08-13 NOTE — Assessment & Plan Note (Signed)
PSA

## 2017-08-13 NOTE — Assessment & Plan Note (Signed)
Chronic  Potential benefits of a long term benzodiazepines  use as well as potential risks  and complications were explained to the patient and were aknowledged. 

## 2017-08-13 NOTE — Assessment & Plan Note (Signed)
?  small R Will observe

## 2017-08-13 NOTE — Assessment & Plan Note (Signed)
Eliquis 

## 2017-08-13 NOTE — Progress Notes (Signed)
Subjective:  Patient ID: Dominic Shaffer, male    DOB: [birth year]  Age: 77 y.o. MRN: [id number]  CC: No chief complaint on file.   HPI Dominic Shaffer presents for R groin pain x 3 weeks. H/o R hernia repair 1 y ago - Dr Barkley Bruns F/u BPH, HTN  Outpatient Medications Prior to Visit  Medication Sig Dispense Refill  . amLODipine (NORVASC) 5 MG tablet TAKE 1 TABLET (5 MG TOTAL) BY MOUTH DAILY. 90 tablet 1  . azithromycin (ZITHROMAX Z-PAK) 250 MG tablet As directed 6 each 0  . ELIQUIS 5 MG TABS tablet TAKE 1 TABLET (5 MG TOTAL) BY MOUTH 2 (TWO) TIMES DAILY. 180 tablet 1  . gabapentin (NEURONTIN) 100 MG capsule Take 2 capsules (200 mg total) by mouth at bedtime. 60 capsule 3  . HYDROcodone-acetaminophen (NORCO) 5-325 MG tablet Take 1 tablet by mouth every 6 (six) hours as needed for severe pain. 20 tablet 0  . IRON PO Take 65 mg by mouth daily.    . Multiple Vitamin (MULTIVITAMIN) tablet Take 1 tablet by mouth daily.    . naproxen sodium (ANAPROX) 220 MG tablet Take 220 mg by mouth 2 (two) times daily with a meal.    . predniSONE (DELTASONE) 10 MG tablet Prednisone 10 mg: take 4 tabs a day x 3 days; then 3 tabs a day x 4 days; then 2 tabs a day x 4 days, then 1 tab a day x 6 days, then stop. Take pc. 38 tablet 1  . terazosin (HYTRIN) 10 MG capsule Take 10 mg by mouth at bedtime.     . triamcinolone (KENALOG) 0.025 % ointment Apply topically 3 (three) times daily. 80 g 1  . triamcinolone (KENALOG) 0.025 % ointment APPLY TOPICALLY 3 TIMES DAILY 30 g 0  . triamcinolone ointment (KENALOG) 0.5 % APPLY 1 APPLICATION TOPICALLY 2 TIMES DAILY 15 g 0   No facility-administered medications prior to visit.     ROS: Review of Systems  Constitutional: Negative for appetite change, fatigue and unexpected weight change.  HENT: Negative for congestion, nosebleeds, sneezing, sore throat and trouble swallowing.   Eyes: Negative for itching and visual disturbance.  Respiratory: Negative for  cough.   Cardiovascular: Negative for chest pain, palpitations and leg swelling.  Gastrointestinal: Negative for abdominal distention, blood in stool, diarrhea and nausea.  Genitourinary: Negative for frequency and hematuria.  Musculoskeletal: Positive for arthralgias. Negative for back pain, gait problem, joint swelling and neck pain.  Skin: Negative for rash.  Neurological: Negative for dizziness, tremors, speech difficulty and weakness.  Psychiatric/Behavioral: Negative for agitation, dysphoric mood, sleep disturbance and suicidal ideas. The patient is not nervous/anxious.     Objective:  BP 128/62 (BP Location: Left Arm, Patient Position: Sitting, Cuff Size: Large)   Pulse 62   Temp 98.7 F (37.1 C) (Oral)   Ht 5\' 10"  (1.778 m)   Wt 217 lb (98.4 kg)   SpO2 96%   BMI 31.14 kg/m   BP Readings from Last 3 Encounters:  08/13/17 128/62  05/28/17 132/64  04/11/17 140/60    Wt Readings from Last 3 Encounters:  08/13/17 217 lb (98.4 kg)  05/28/17 219 lb (99.3 kg)  04/11/17 220 lb (99.8 kg)    Physical Exam  Constitutional: He is oriented to person, place, and time. He appears well-developed. No distress.  NAD  HENT:  Mouth/Throat: Oropharynx is clear and moist.  Eyes: Pupils are equal, round, and reactive to light. Conjunctivae are normal.  Neck:  Normal range of motion. No JVD present. No thyromegaly present.  Cardiovascular: Normal rate, regular rhythm, normal heart sounds and intact distal pulses. Exam reveals no gallop and no friction rub.  No murmur heard. Pulmonary/Chest: Effort normal and breath sounds normal. No respiratory distress. He has no wheezes. He has no rales. He exhibits no tenderness.  Abdominal: Soft. Bowel sounds are normal. He exhibits no distension and no mass. There is no tenderness. There is no rebound and no guarding.  Musculoskeletal: Normal range of motion. He exhibits no edema or tenderness.  Lymphadenopathy:    He has no cervical adenopathy.    Neurological: He is alert and oriented to person, place, and time. He has normal reflexes. No cranial nerve deficit. He exhibits normal muscle tone. He displays a negative Romberg sign. Coordination and gait normal.  Skin: Skin is warm and dry. No rash noted.  Psychiatric: He has a normal mood and affect. His behavior is normal. Judgment and thought content normal.    Lab Results  Component Value Date   WBC 6.4 10/25/2016   HGB 13.4 10/25/2016   HCT 40.3 10/25/2016   PLT 204 10/25/2016   GLUCOSE 116 (H) 02/27/2017   CHOL 141 06/04/2014   TRIG 89.0 06/04/2014   HDL 32.20 (L) 06/04/2014   LDLCALC 91 06/04/2014   ALT 13 (L) 07/30/2015   AST 14 (L) 07/30/2015   NA 139 02/27/2017   K 3.9 02/27/2017   CL 103 02/27/2017   CREATININE 1.16 02/27/2017   BUN 17 02/27/2017   CO2 26 02/27/2017   TSH 1.61 06/04/2014   PSA 0.02 (L) 02/27/2017   INR 0.99 10/25/2016    Dg Lumbar Spine 2-3 Views  Result Date: 02/05/2017 CLINICAL DATA:  Back pain.  No known injury. EXAM: LUMBAR SPINE - 2-3 VIEW COMPARISON:  CT 08/10/2014. FINDINGS: Mild scoliosis concave right with diffuse multilevel degenerative change. Minimal 2 mm anterolisthesis noted the lowest completely segmented lumbar vertebrae. No acute bony abnormality identified. Vascular calcification noted. IMPRESSION: Mild scoliosis concave right with diffuse multilevel degenerative change. Minimal anterolisthesis of 2 mm of the lowest completely segmented lumbar vertebral body. No significant anterolisthesis noted. No acute bony abnormality. Electronically Signed   By: Marcello Moores  Register   On: 02/05/2017 15:49    Assessment & Plan:   There are no diagnoses linked to this encounter.   No orders of the defined types were placed in this encounter.    Follow-up: No follow-ups on file.  Walker Kehr, MD

## 2017-08-13 NOTE — Assessment & Plan Note (Signed)
Triamcinolone prn

## 2017-08-13 NOTE — Assessment & Plan Note (Signed)
Losartan HCT, Amlodipine, Terazosyn 

## 2017-09-12 ENCOUNTER — Other Ambulatory Visit: Payer: Self-pay | Admitting: Family Medicine

## 2018-01-02 DIAGNOSIS — H52223 Regular astigmatism, bilateral: Secondary | ICD-10-CM | POA: Diagnosis not present

## 2018-01-02 DIAGNOSIS — H35033 Hypertensive retinopathy, bilateral: Secondary | ICD-10-CM | POA: Diagnosis not present

## 2018-01-02 DIAGNOSIS — I10 Essential (primary) hypertension: Secondary | ICD-10-CM | POA: Diagnosis not present

## 2018-01-02 DIAGNOSIS — H5203 Hypermetropia, bilateral: Secondary | ICD-10-CM | POA: Diagnosis not present

## 2018-01-02 DIAGNOSIS — H25813 Combined forms of age-related cataract, bilateral: Secondary | ICD-10-CM | POA: Diagnosis not present

## 2018-01-14 ENCOUNTER — Telehealth: Payer: Self-pay | Admitting: Internal Medicine

## 2018-01-14 MED ORDER — FAMOTIDINE 40 MG PO TABS: 40.0000 mg | ORAL_TABLET | Freq: Every day | ORAL | 3 refills | Status: DC

## 2018-01-14 NOTE — Telephone Encounter (Signed)
Please advise about GERD medication

## 2018-01-14 NOTE — Telephone Encounter (Signed)
I emailed Pepcid prescription to [facility].  Thank you

## 2018-01-14 NOTE — Telephone Encounter (Signed)
Copied from [place] [id number]. Topic: Quick Communication - See Telephone Encounter >> Jan 14, 2018  2:51 PM Conception Chancy, NT wrote: CRM for notification. See Telephone encounter for: 01/14/18.  Patient is calling and requesting a prescription for Protonix sent to his pharmacy for acid reflux. Patient states that Dr. Alain Marion has not prescribed this medicine before but he does know about his acid reflux. Please advise.  [place], [place] - [address] [place] [place] [address] Phone: [phone] Fax: [phone]

## 2018-01-14 NOTE — Telephone Encounter (Signed)
Patient called back and does not Protonix sent ,due to the price and he would like something cost friendly instead .

## 2018-01-15 ENCOUNTER — Other Ambulatory Visit: Payer: Self-pay

## 2018-01-15 MED ORDER — FAMOTIDINE 40 MG PO TABS: 40.0000 mg | ORAL_TABLET | Freq: Every day | ORAL | 3 refills | Status: DC

## 2018-01-17 MED ORDER — PANTOPRAZOLE SODIUM 40 MG PO TBEC: 40.0000 mg | DELAYED_RELEASE_TABLET | Freq: Every day | ORAL | 3 refills | Status: DC

## 2018-01-17 NOTE — Telephone Encounter (Signed)
Ok Thx 

## 2018-01-17 NOTE — Addendum Note (Signed)
Addended by: Cassandria Anger on: 01/17/2018 07:42 AM   Modules accepted: Orders

## 2018-01-17 NOTE — Addendum Note (Signed)
Addended by: Cassandria Anger on: 01/17/2018 07:41 AM   Modules accepted: Orders

## 2018-02-21 ENCOUNTER — Other Ambulatory Visit: Payer: Self-pay | Admitting: Internal Medicine

## 2018-02-21 NOTE — Telephone Encounter (Signed)
Requested medication (s) are due for refill today: yes  Requested medication (s) are on the active medication list: yes  Last refill:  07/23/15 historic provider Dr Jeffie Pollock per patient  Future visit scheduled: yes  Notes to clinic:  Pt is request quick reorder. He has just a few doses.    Requested Prescriptions  Pending Prescriptions Disp Refills   terazosin (HYTRIN) 10 MG capsule      Sig: Take 1 capsule (10 mg total) by mouth at bedtime.     Cardiovascular:  Alpha Blockers Failed - 02/21/2018  2:20 PM      Failed - Valid encounter within last 6 months    Recent Outpatient Visits          6 months ago Essential hypertension   [place], Evie Lacks, MD   8 months ago Plantar fasciitis of right foot   [place] Plotnikov, Evie Lacks, MD   10 months ago Degenerative disc disease, lumbar   [place], [place], DO   11 months ago Degenerative disc disease, lumbar   [place], [place], DO   11 months ago Radiculopathy of leg   [place], MD      Future Appointments            In 2 weeks Plotnikov, Evie Lacks, MD [place], [place] BP in normal range    BP Readings from Last 1 Encounters:  08/13/17 128/62

## 2018-02-21 NOTE — Telephone Encounter (Signed)
Spoke with patient and scheduled appointment.  Pt has just a few days left of his Hytrin.  He states it was originally ordered by Dr Jeffie Pollock at [facility].

## 2018-02-21 NOTE — Telephone Encounter (Signed)
Copied from [place] [id number]. Topic: Quick Communication - Rx Refill/Question >> Feb 21, 2018  1:17 PM Bea Graff, NT wrote: Medication: terazosin (HYTRIN) 10 MG capsule   Has the patient contacted their pharmacy? Yes.   (Agent: If no, request that the patient contact the pharmacy for the refill.) (Agent: If yes, when and what did the pharmacy advise?)  Preferred Pharmacy (with phone number or street name): [place], [place] [phone] (Phone) [phone] (Fax)    Agent: Please be advised that RX refills may take up to 3 business days. We ask that you follow-up with your pharmacy.

## 2018-02-22 IMAGING — US US ASPIRATION
1 series · 11 of 11 positions shown · non-contrast
Comparison: none

INDICATION: Recurrent symptomatic left renal cyst requiring aspiration. Status
post prior decompression of the dominant left renal cyst in 2682.

[Series 1: us aspiration · 0.23mm/px · 11 of 11 slices shown]
[im 1/11]
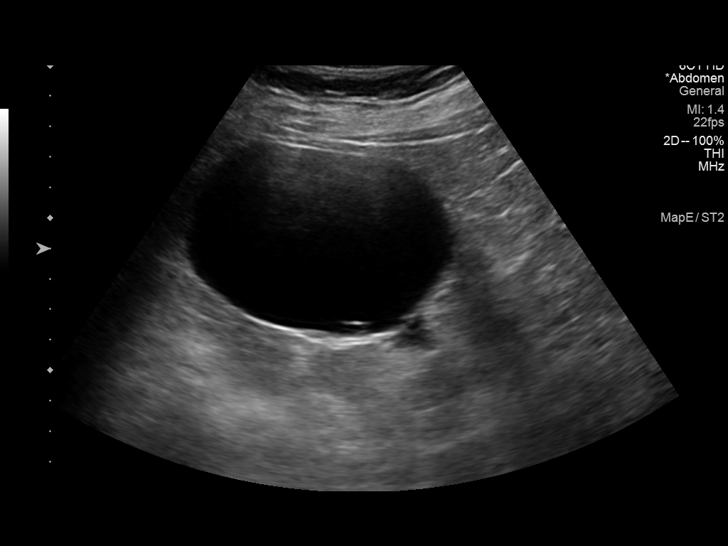
[im 2/11]
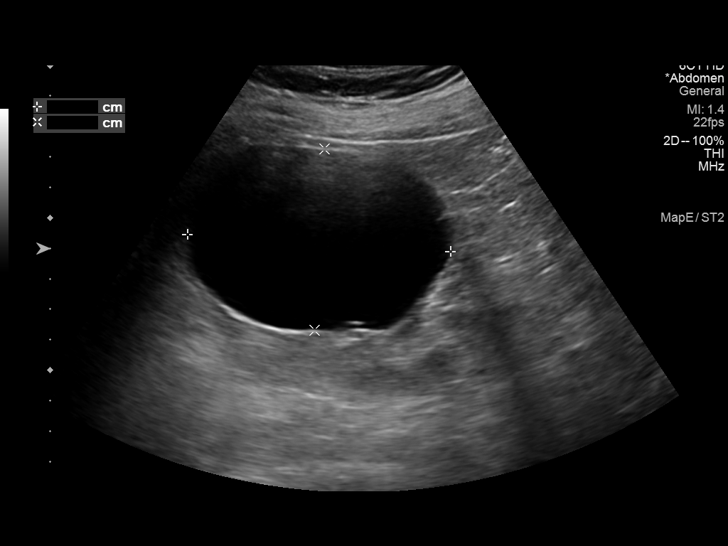
[im 3/11]
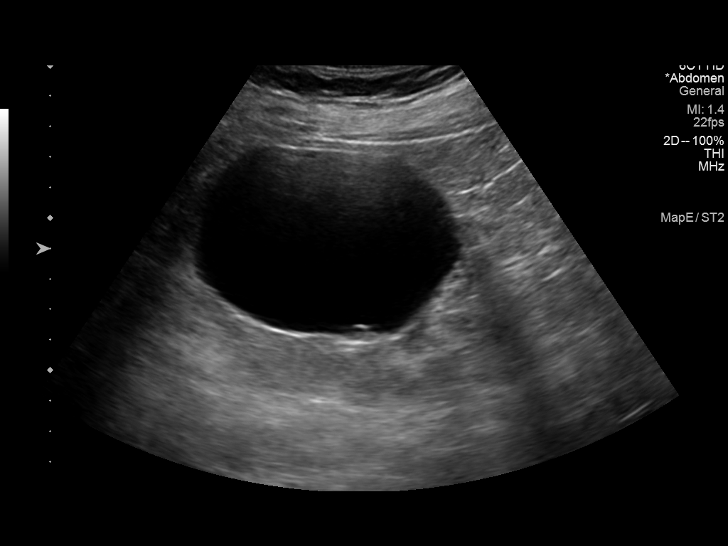
[im 4/11]
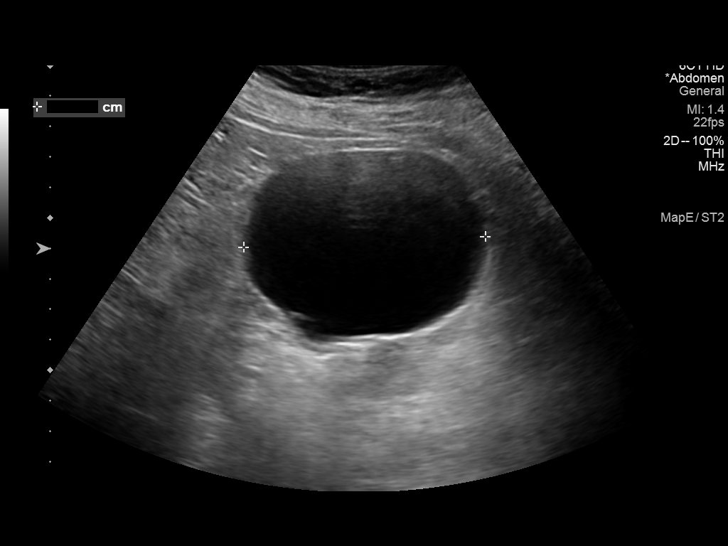
[im 5/11]
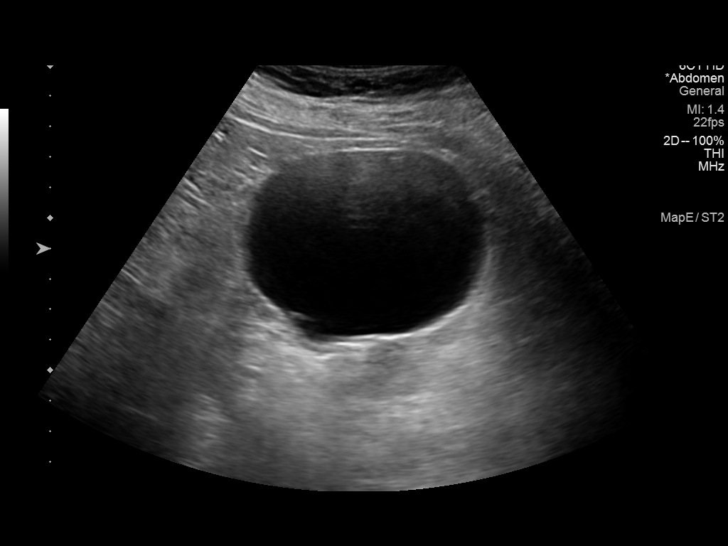
[im 6/11]
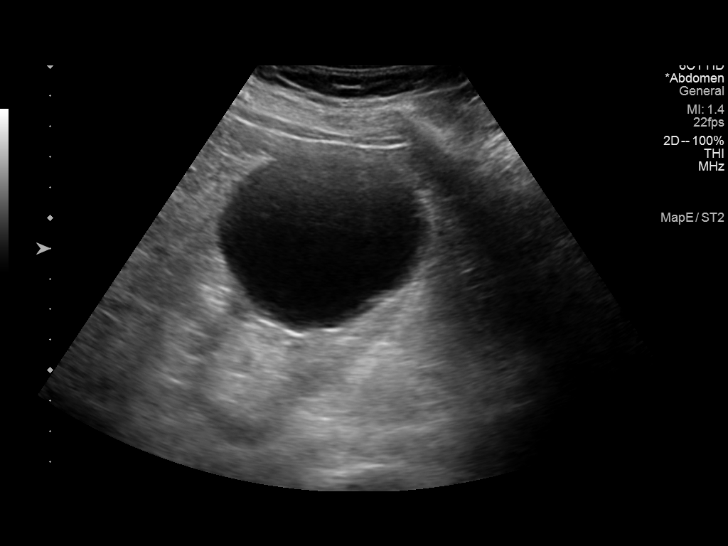
[im 7/11]
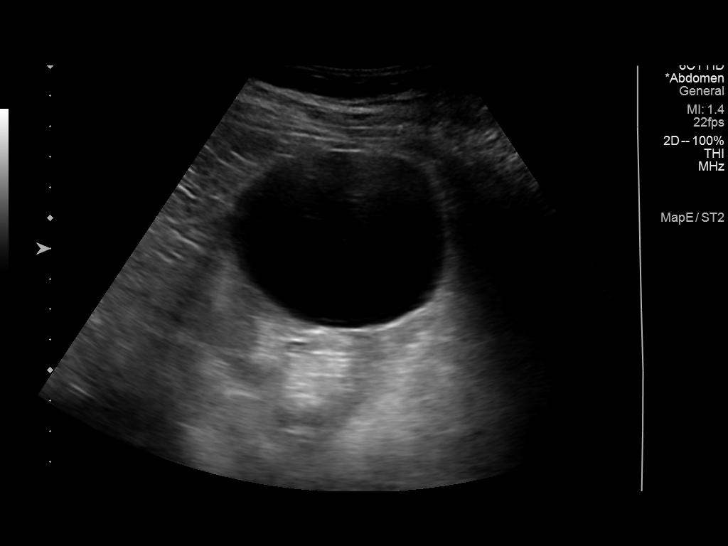
[im 8/11]
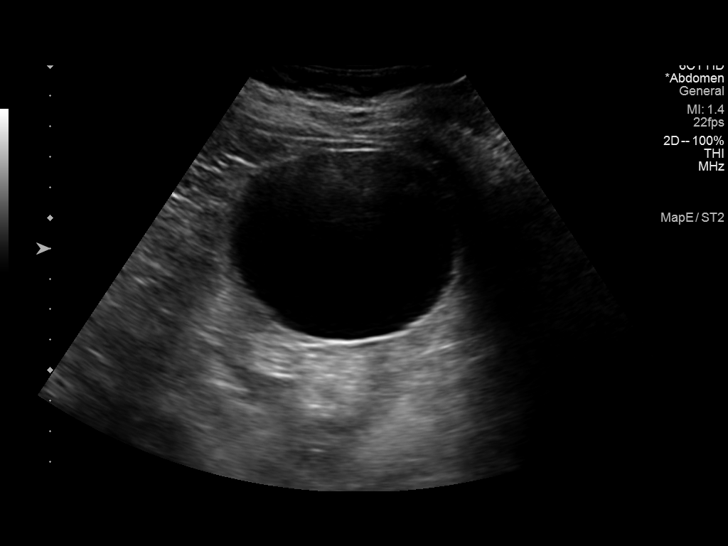
[im 9/11]
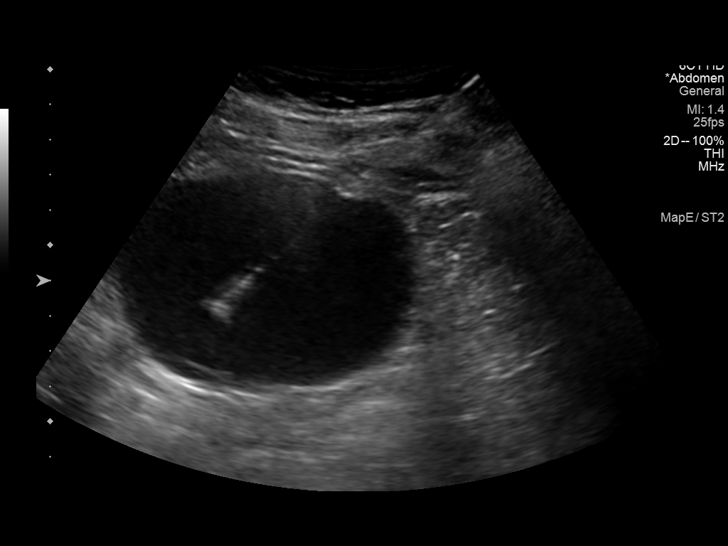
[im 10/11]
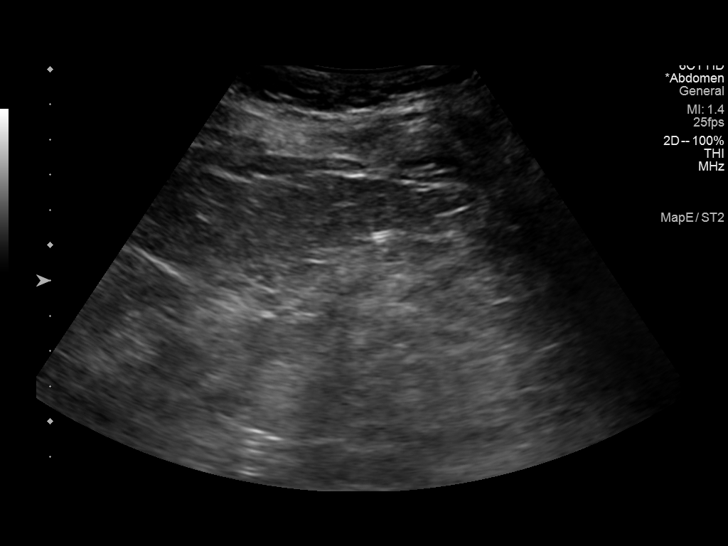
[im 11/11]
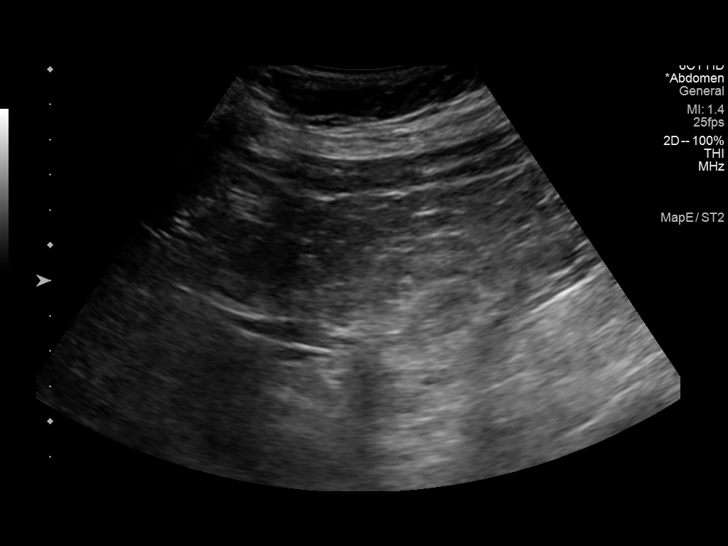

[11 of 11 positions shown; findings below may reference images not displayed]

EXAM:
ULTRASOUND-GUIDED ASPIRATION AND DRAINAGE OF LEFT RENAL CYST

MEDICATIONS:
None

ANESTHESIA/SEDATION:
Fentanyl 100 mcg IV; Versed 2.0 mg IV

Moderate Sedation Time:  10 minutes.

The patient was continuously monitored during the procedure by the
interventional radiology nurse under my direct supervision.

COMPLICATIONS:
None immediate.

PROCEDURE:
Informed written consent was obtained from the patient after a
thorough discussion of the procedural risks, benefits and
alternatives. All questions were addressed. Maximal Sterile Barrier
Technique was utilized including caps, mask, sterile gowns, sterile
gloves, sterile drape, hand hygiene and skin antiseptic. A timeout
was performed prior to the initiation of the procedure. Local
anesthesia was provided with 1% lidocaine.

Ultrasound was used to localize a left renal cyst. Under direct
ultrasound guidance, a 5 Dimitar Hair centesis catheter was advanced
into the renal cyst. Aspiration was performed. Additional ultrasound
was performed after cyst decompression.
FINDINGS: There is a single dominant left renal cyst measuring approximately 9
x 6 x 8 cm. Aspiration yielded 225 mL of clear fluid and resulted in
complete collapse of the cyst. No other left renal cysts are
identified.
IMPRESSION: Ultrasound-guided aspiration and decompression of left renal cyst
yielding 225 mL of clear fluid. This resulted in complete
decompression of the cyst.

## 2018-02-26 MED ORDER — TERAZOSIN HCL 10 MG PO CAPS: 10.0000 mg | ORAL_CAPSULE | Freq: Every day | ORAL | 0 refills | Status: DC

## 2018-03-10 ENCOUNTER — Ambulatory Visit (INDEPENDENT_AMBULATORY_CARE_PROVIDER_SITE_OTHER): Payer: Medicare HMO | Admitting: Internal Medicine

## 2018-03-10 ENCOUNTER — Other Ambulatory Visit (INDEPENDENT_AMBULATORY_CARE_PROVIDER_SITE_OTHER): Payer: Medicare HMO

## 2018-03-10 ENCOUNTER — Encounter: Payer: Self-pay | Admitting: Internal Medicine

## 2018-03-10 DIAGNOSIS — I2510 Atherosclerotic heart disease of native coronary artery without angina pectoris: Secondary | ICD-10-CM

## 2018-03-10 DIAGNOSIS — I82412 Acute embolism and thrombosis of left femoral vein: Secondary | ICD-10-CM | POA: Diagnosis not present

## 2018-03-10 DIAGNOSIS — C61 Malignant neoplasm of prostate: Secondary | ICD-10-CM | POA: Diagnosis not present

## 2018-03-10 DIAGNOSIS — I2583 Coronary atherosclerosis due to lipid rich plaque: Secondary | ICD-10-CM

## 2018-03-10 DIAGNOSIS — I10 Essential (primary) hypertension: Secondary | ICD-10-CM

## 2018-03-10 DIAGNOSIS — Z5181 Encounter for therapeutic drug level monitoring: Secondary | ICD-10-CM

## 2018-03-10 DIAGNOSIS — Z7901 Long term (current) use of anticoagulants: Secondary | ICD-10-CM | POA: Diagnosis not present

## 2018-03-10 HISTORY — DX: Atherosclerotic heart disease of native coronary artery without angina pectoris: I25.10

## 2018-03-10 LAB — HEPATIC FUNCTION PANEL
ALT: 10 U/L (ref 0–53)
AST: 11 U/L (ref 0–37)
Albumin: 3.6 g/dL (ref 3.5–5.2)
Alkaline Phosphatase: 74 U/L (ref 39–117)
Bilirubin, Direct: 0.1 mg/dL (ref 0.0–0.3)
Total Bilirubin: 0.8 mg/dL (ref 0.2–1.2)
Total Protein: 6.8 g/dL (ref 6.0–8.3)

## 2018-03-10 LAB — URINALYSIS
BILIRUBIN URINE: NEGATIVE
Hgb urine dipstick: NEGATIVE
Ketones, ur: NEGATIVE
Leukocytes, UA: NEGATIVE
NITRITE: NEGATIVE
Specific Gravity, Urine: <=1.005 — AB (ref 1.000–1.030)
Total Protein, Urine: NEGATIVE
Urine Glucose: NEGATIVE
Urobilinogen, UA: 0.2 (ref 0.0–1.0)
pH: 6 (ref 5.0–8.0)

## 2018-03-10 LAB — CBC WITH DIFFERENTIAL/PLATELET
Basophils Absolute: 0 10*3/uL (ref 0.0–0.1)
Basophils Relative: 0.3 % (ref 0.0–3.0)
Eosinophils Absolute: 0.1 10*3/uL (ref 0.0–0.7)
Eosinophils Relative: 1.1 % (ref 0.0–5.0)
HCT: 41.5 % (ref 39.0–52.0)
Hemoglobin: 13.5 g/dL (ref 13.0–17.0)
Lymphocytes Relative: 16.8 % (ref 12.0–46.0)
Lymphs Abs: 2.1 10*3/uL (ref 0.7–4.0)
MCHC: 32.6 g/dL (ref 30.0–36.0)
MCV: 79.8 fl (ref 78.0–100.0)
Monocytes Absolute: 1.1 10*3/uL — ABNORMAL HIGH (ref 0.1–1.0)
Monocytes Relative: 8.7 % (ref 3.0–12.0)
Neutro Abs: 9 10*3/uL — ABNORMAL HIGH (ref 1.4–7.7)
Neutrophils Relative %: 73.1 % (ref 43.0–77.0)
Platelets: 202 10*3/uL (ref 150.0–400.0)
RBC: 5.2 Mil/uL (ref 4.22–5.81)
RDW: 14.3 % (ref 11.5–15.5)
WBC: 12.4 10*3/uL — ABNORMAL HIGH (ref 4.0–10.5)

## 2018-03-10 LAB — BASIC METABOLIC PANEL
BUN: 14 mg/dL (ref 6–23)
CO2: 25 mEq/L (ref 19–32)
Calcium: 8.7 mg/dL (ref 8.4–10.5)
Chloride: 104 mEq/L (ref 96–112)
Creatinine, Ser: 1.11 mg/dL (ref 0.40–1.50)
GFR: 77.94 mL/min (ref >60.00)
Glucose, Bld: 86 mg/dL (ref 70–99)
Potassium: 3.6 mEq/L (ref 3.5–5.1)
Sodium: 138 mEq/L (ref 135–145)

## 2018-03-10 LAB — LIPID PANEL
Cholesterol: 146 mg/dL (ref 0–200)
HDL: 31.1 mg/dL — ABNORMAL LOW (ref >39.00)
LDL Cholesterol: 86 mg/dL (ref 0–99)
NonHDL: 114.72
Total CHOL/HDL Ratio: 5
Triglycerides: 146 mg/dL (ref 0.0–149.0)
VLDL: 29.2 mg/dL (ref 0.0–40.0)

## 2018-03-10 LAB — TSH: TSH: 1.84 u[IU]/mL (ref 0.35–4.50)

## 2018-03-10 MED ORDER — APIXABAN 5 MG PO TABS: ORAL_TABLET | ORAL | 3 refills | Status: DC

## 2018-03-10 MED ORDER — AMLODIPINE BESYLATE 5 MG PO TABS: 5.0000 mg | ORAL_TABLET | Freq: Every day | ORAL | 3 refills | Status: DC

## 2018-03-10 NOTE — Assessment & Plan Note (Signed)
Losartan HCT, Amlodipine, Terazosyn 

## 2018-03-10 NOTE — Assessment & Plan Note (Addendum)
  Low risk cardiac CT 2012:     1)    Calcium Score 26 isolated area in mid LAD        2)    <50% stenosis in mid LAD involving ostium of D1 3)    Distal circumflex and PDA not well seen Large right sided hiatal hernia with basilar atelectasis Original Report Authenticated By: Angelita Ingles, M.D.  On Eliquis Card ref Dr Johnsie Cancel

## 2018-03-10 NOTE — Assessment & Plan Note (Signed)
PSA - per Dr Jeffie Pollock

## 2018-03-10 NOTE — Progress Notes (Signed)
Subjective:  Patient ID: Dominic Shaffer, male    DOB: [birth year]  Age: 78 y.o. MRN: [id number]  CC: No chief complaint on file.   HPI Dominic Shaffer presents for CAD, DVT, HTN f/u  Outpatient Medications Prior to Visit  Medication Sig Dispense Refill  . amLODipine (NORVASC) 5 MG tablet TAKE 1 TABLET (5 MG TOTAL) BY MOUTH DAILY. 90 tablet 1  . ELIQUIS 5 MG TABS tablet TAKE 1 TABLET (5 MG TOTAL) BY MOUTH 2 (TWO) TIMES DAILY. 180 tablet 1  . famotidine (PEPCID) 40 MG tablet Take 1 tablet (40 mg total) by mouth daily. 90 tablet 3  . gabapentin (NEURONTIN) 100 MG capsule TAKE 2 CAPSULES BY MOUTH AT BEDTIME 60 capsule 3  . HYDROcodone-acetaminophen (NORCO) 5-325 MG tablet Take 1 tablet by mouth every 6 (six) hours as needed for severe pain. 20 tablet 0  . IRON PO Take 65 mg by mouth daily.    . Multiple Vitamin (MULTIVITAMIN) tablet Take 1 tablet by mouth daily.    . naproxen sodium (ANAPROX) 220 MG tablet Take 220 mg by mouth 2 (two) times daily with a meal.    . pantoprazole (PROTONIX) 40 MG tablet Take 1 tablet (40 mg total) by mouth daily. 90 tablet 3  . terazosin (HYTRIN) 10 MG capsule Take 1 capsule (10 mg total) by mouth at bedtime. Must keep schedule appt for future refills 90 capsule 0  . triamcinolone (KENALOG) 0.025 % ointment Apply topically 3 (three) times daily. 80 g 1   No facility-administered medications prior to visit.     ROS: Review of Systems  Constitutional: Negative for appetite change, fatigue and unexpected weight change.  HENT: Negative for congestion, nosebleeds, sneezing, sore throat and trouble swallowing.   Eyes: Negative for itching and visual disturbance.  Respiratory: Negative for cough.   Cardiovascular: Negative for chest pain, palpitations and leg swelling.  Gastrointestinal: Negative for abdominal distention, blood in stool, diarrhea and nausea.  Genitourinary: Negative for frequency and hematuria.  Musculoskeletal: Positive for back  pain. Negative for gait problem, joint swelling and neck pain.  Skin: Negative for rash.  Neurological: Negative for dizziness, tremors, speech difficulty and weakness.  Psychiatric/Behavioral: Negative for agitation, dysphoric mood, sleep disturbance and suicidal ideas. The patient is not nervous/anxious.     Objective:  BP 134/60 (BP Location: Left Arm, Patient Position: Sitting, Cuff Size: Large)   Pulse 68   Temp 98.1 F (36.7 C) (Oral)   Ht 5\' 10"  (1.778 m)   Wt 220 lb (99.8 kg)   SpO2 96%   BMI 31.57 kg/m   BP Readings from Last 3 Encounters:  03/10/18 134/60  08/13/17 128/62  05/28/17 132/64    Wt Readings from Last 3 Encounters:  03/10/18 220 lb (99.8 kg)  08/13/17 217 lb (98.4 kg)  05/28/17 219 lb (99.3 kg)    Physical Exam Constitutional:      General: He is not in acute distress.    Appearance: He is well-developed.     Comments: NAD  Eyes:     Conjunctiva/sclera: Conjunctivae normal.     Pupils: Pupils are equal, round, and reactive to light.  Neck:     Musculoskeletal: Normal range of motion.     Thyroid: No thyromegaly.     Vascular: No JVD.  Cardiovascular:     Rate and Rhythm: Normal rate and regular rhythm.     Heart sounds: Normal heart sounds. No murmur. No friction rub. No gallop.   Pulmonary:  Effort: Pulmonary effort is normal. No respiratory distress.     Breath sounds: Normal breath sounds. No wheezing or rales.  Chest:     Chest wall: No tenderness.  Abdominal:     General: Bowel sounds are normal. There is no distension.     Palpations: Abdomen is soft. There is no mass.     Tenderness: There is no abdominal tenderness. There is no guarding or rebound.  Musculoskeletal: Normal range of motion.        General: Tenderness present.  Lymphadenopathy:     Cervical: No cervical adenopathy.  Skin:    General: Skin is warm and dry.     Findings: No rash.  Neurological:     Mental Status: He is alert and oriented to person, place, and  time.     Cranial Nerves: No cranial nerve deficit.     Motor: No abnormal muscle tone.     Coordination: Coordination normal.     Gait: Gait normal.     Deep Tendon Reflexes: Reflexes are normal and symmetric.  Psychiatric:        Behavior: Behavior normal.        Thought Content: Thought content normal.        Judgment: Judgment normal.   LS - pain w/ROM  Lab Results  Component Value Date   WBC 6.4 10/25/2016   HGB 13.4 10/25/2016   HCT 40.3 10/25/2016   PLT 204 10/25/2016   GLUCOSE 97 08/13/2017   CHOL 141 06/04/2014   TRIG 89.0 06/04/2014   HDL 32.20 (L) 06/04/2014   LDLCALC 91 06/04/2014   ALT 11 08/13/2017   AST 10 08/13/2017   NA 139 08/13/2017   K 4.0 08/13/2017   CL 106 08/13/2017   CREATININE 1.01 08/13/2017   BUN 14 08/13/2017   CO2 27 08/13/2017   TSH 1.61 06/04/2014   PSA 0.01 (L) 08/13/2017   INR 0.99 10/25/2016    Dg Lumbar Spine 2-3 Views  Result Date: 02/05/2017 CLINICAL DATA:  Back pain.  No known injury. EXAM: LUMBAR SPINE - 2-3 VIEW COMPARISON:  CT 08/10/2014. FINDINGS: Mild scoliosis concave right with diffuse multilevel degenerative change. Minimal 2 mm anterolisthesis noted the lowest completely segmented lumbar vertebrae. No acute bony abnormality identified. Vascular calcification noted. IMPRESSION: Mild scoliosis concave right with diffuse multilevel degenerative change. Minimal anterolisthesis of 2 mm of the lowest completely segmented lumbar vertebral body. No significant anterolisthesis noted. No acute bony abnormality. Electronically Signed   By: Marcello Moores  Register   On: 02/05/2017 15:49    Assessment & Plan:   There are no diagnoses linked to this encounter.   No orders of the defined types were placed in this encounter.    Follow-up: No follow-ups on file.  Walker Kehr, MD

## 2018-03-10 NOTE — Assessment & Plan Note (Signed)
Eliquis 

## 2018-05-19 ENCOUNTER — Ambulatory Visit: Payer: Medicare HMO | Admitting: Cardiovascular Disease

## 2018-05-20 ENCOUNTER — Other Ambulatory Visit: Payer: Self-pay

## 2018-05-21 MED ORDER — TERAZOSIN HCL 10 MG PO CAPS: 10.0000 mg | ORAL_CAPSULE | Freq: Every day | ORAL | 1 refills | Status: DC

## 2018-09-08 ENCOUNTER — Ambulatory Visit: Payer: Medicare HMO | Admitting: Cardiovascular Disease

## 2018-11-14 ENCOUNTER — Ambulatory Visit: Payer: Medicare HMO | Admitting: Cardiovascular Disease

## 2018-11-17 DIAGNOSIS — K006 Disturbances in tooth eruption: Secondary | ICD-10-CM | POA: Diagnosis not present

## 2018-11-20 NOTE — Progress Notes (Signed)
CARDIOLOGY CONSULT NOTE       Patient ID: BRAVE FORGEY MRN: [id number] DOB/AGE: 1940/03/21 78 y.o.  Admit date: (Not on file) Referring Physician: Plotnikov Primary Physician: Cassandria Anger, MD Primary Cardiologist: New Reason for Consultation: CAD  Active Problems:   * No active hospital problems. *   HPI:  78 y.o. last seen by cardiology in 2013. History of LBBB, HTN, esophageal stricture and DVT on eliquis Calcium score 2013 28 involving LAD/D1 Cardiac CTA with less than 50% mid LAD stenosis Also noted marked esophageal dilatation with hiatal hernia  He has had 123456 LICA stenosis that has not had f/u since 2013 Non ischemic myvovue in 2012 Last echo done 07/05/10 EF 45-50%   DVT 2017 involving left femoral, popliteal and posterior tibial veins   No cardiac symptoms Working a bit doing HR for an Nurse, learning disability company based in [place]     ROS All other systems reviewed and negative except as noted above  Past Medical History:  Diagnosis Date  . Asymptomatic stenosis of left carotid artery without infarction    per duplex LICA 123456 ([id number])  . At risk for sleep apnea    STOP-BANG= 4     SENT TO PCP 12-23-2013  . BPH with urinary obstruction    hyperplasia  . Colon polyps   . Congenital renal cyst, single   . Coronary artery disease    cardiologist--  dr Johnsie Cancel-- myoview 07-05-2010 poss. small inferior wall infact/  no ischemia/ ef 51%/  cardic CT score 28 and <50% LAD/D! disease  . Depression   . Diverticulitis   . ED (erectile dysfunction)    secondary arterial insuffiency  . Esophageal achalasia    s/p multiple dilatations and botox injection tx and surgical intervention 06-17-2011  [facility] Myotomy  . Hematospermia   . History of DVT of lower extremity    06/ 2012  . History of esophageal dilatation    and botox injection tx's at [facility]  . Hypertension   . Internal hemorrhoid 2003   Dr. Henrene Pastor  . LBBB (left bundle branch block)   . LBP (low  back pain)   . Nocturia   . Prostate cancer ([place])   . Wears glasses     Family History  Problem Relation Age of Onset  . Heart disease Mother 33       CAD  . Kidney disease Mother   . Cancer Father 42       bladder ca; passed in 1999  . Hypertension Other     Social History   Socioeconomic History  . Marital status: Married    Spouse name: Not on file  . Number of children: 3  . Years of education: Not on file  . Highest education level: Not on file  Occupational History  . Occupation: HR  Social Needs  . Financial resource strain: Not on file  . Food insecurity    Worry: Not on file    Inability: Not on file  . Transportation needs    Medical: Not on file    Non-medical: Not on file  Tobacco Use  . Smoking status: Former Smoker    Packs/day: 0.25    Years: 0.50    Pack years: 0.12    Types: Cigarettes    Quit date: 07/03/1965    Years since quitting: 53.4  . Smokeless tobacco: Never Used  Substance and Sexual Activity  . Alcohol use: No  . Drug use: No  . Sexual activity:  Not on file    Comment: one pack of cigarettes would last a week  Lifestyle  . Physical activity    Days per week: Not on file    Minutes per session: Not on file  . Stress: Not on file  Relationships  . Social Herbalist on phone: Not on file    Gets together: Not on file    Attends religious service: Not on file    Active member of club or organization: Not on file    Attends meetings of clubs or organizations: Not on file    Relationship status: Not on file  . Intimate partner violence    Fear of current or ex partner: Not on file    Emotionally abused: Not on file    Physically abused: Not on file    Forced sexual activity: Not on file  Other Topics Concern  . Not on file  Social History Narrative   Regular exercise-no    Past Surgical History:  Procedure Laterality Date  . CARDIOVASCULAR STRESS TEST  07-05-2010  dr Johnsie Cancel   Adenosine nuclear study/  no significant  ST segment change suggestive of ischemia/  possible small inferior wall infarct at mid and basal level/ ef 51%/  LBBB/  normal wall motion  . COLONOSCOPY  last one 2009  . INGUINAL HERNIA REPAIR Left 10/13/2012   Procedure: OPEN LEFT INGUINAL HERNIA REPAIR WITH ON Q PUMP;  Surgeon: Odis Hollingshead, MD;  Location: [facility];  Service: General;  Laterality: Left;  . INSERTION OF MESH Left 10/13/2012   Procedure: INSERTION OF MESH;  Surgeon: Odis Hollingshead, MD;  Location: [facility];  Service: General;  Laterality: Left;  . IR RADIOLOGIST EVAL & MGMT  10/11/2016  . LAPAROSCOPIC HELLER MYOTOMY  06-16-2001   w/ intraoperative upper endoscopy guidence (for achalasia)  . RADIOACTIVE SEED IMPLANT N/A 12/31/2013   Procedure: RADIOACTIVE SEED IMPLANT;  Surgeon: Malka So, MD;  Location: [facility];  Service: Urology;  Laterality: N/A;  seeds implanted 78 no seeds found in bladder  . TRANSTHORACIC ECHOCARDIOGRAM  07-05-2010   mild LVH/ septal motion consistent w/ LBBB/ ef 45-50%/  diffuse hypokinesis/  grade I diastolic dysfunction/ mild LAE/  trivial TR  . UMBILICAL HERNIA REPAIR  11-11-2005        Physical Exam: Blood pressure (!) 122/54, pulse 84, height 5\' 10"  (1.778 m), weight 214 lb (97.1 kg), SpO2 98 %.    Affect appropriate Healthy:  appears stated age 78: normal Neck supple with no adenopathy JVP normal no bruits no thyromegaly Lungs clear with no wheezing and good diaphragmatic motion Heart:  S1/S2 no murmur, no rub, gallop or click PMI normal Abdomen: benighn, BS positve, no tenderness, no AAA no bruit.  No HSM or HJR Distal pulses intact with no bruits No edema Neuro non-focal Skin warm and dry No muscular weakness   Labs:   Lab Results  Component Value Date   WBC 12.4 (H) 03/10/2018   HGB 13.5 03/10/2018   HCT 41.5 03/10/2018   MCV 79.8 03/10/2018   PLT 202.0 03/10/2018   No results for input(s): NA, K, CL, CO2, BUN, CREATININE, CALCIUM, PROT,  BILITOT, ALKPHOS, ALT, AST, GLUCOSE in the last 168 hours.  Invalid input(s): LABALBU No results found for: CKTOTAL, CKMB, CKMBINDEX, TROPONINI  Lab Results  Component Value Date   CHOL 146 03/10/2018   CHOL 141 06/04/2014   CHOL 137 04/29/2007   Lab Results  Component Value Date   HDL 31.10 (L) 03/10/2018   HDL 32.20 (L) 06/04/2014   HDL 27.9 (L) 04/29/2007   Lab Results  Component Value Date   LDLCALC 86 03/10/2018   Jordan 91 06/04/2014   LDLCALC 93 04/29/2007   Lab Results  Component Value Date   TRIG 146.0 03/10/2018   TRIG 89.0 06/04/2014   TRIG 79 04/29/2007   Lab Results  Component Value Date   CHOLHDL 5 03/10/2018   CHOLHDL 4 06/04/2014   CHOLHDL 4.9 CALC 04/29/2007   No results found for: LDLDIRECT    Radiology: No results found.  EKG: SR rate 84 LBBB 11/24/18    ASSESSMENT AND PLAN:   1. LBBB:  Chronic no evidence of high grade AV block yearly ECG 2. DCM:  EF reported at 45-50% echo 2012 will update echo currently not on ACE/ARB, beta blocker or diuretic 3. Carotid:  123456 LICA stenosis no recent carotid duplex will update 4. CAD:  <50% mid LAD stenosis on cardiac CTA 07/19/10  No chest pain observe unless EF worse on echo  5. HTN:  Well controlled.  Continue current medications and low sodium Dash type diet.  Currently only taking norvasc may need to alter meds pending echo results for DCM   Signed: Jenkins Rouge 11/24/2018, 10:07 AM

## 2018-11-24 ENCOUNTER — Encounter: Payer: Self-pay | Admitting: Cardiovascular Disease

## 2018-11-24 ENCOUNTER — Other Ambulatory Visit: Payer: Self-pay

## 2018-11-24 ENCOUNTER — Ambulatory Visit: Payer: Medicare HMO | Admitting: Cardiovascular Disease

## 2018-11-24 VITALS — BP 122/54 | HR 84 | Ht 70.0 in | Wt 214.0 lb

## 2018-11-24 DIAGNOSIS — I6523 Occlusion and stenosis of bilateral carotid arteries: Secondary | ICD-10-CM | POA: Diagnosis not present

## 2018-11-24 DIAGNOSIS — I447 Left bundle-branch block, unspecified: Secondary | ICD-10-CM

## 2018-11-24 DIAGNOSIS — I420 Dilated cardiomyopathy: Secondary | ICD-10-CM

## 2018-11-24 NOTE — Patient Instructions (Addendum)
Medication Instructions:   *If you need a refill on your cardiac medications before your next appointment, please call your pharmacy*  Lab Work:  If you have labs (blood work) drawn today and your tests are completely normal, you will receive your results only by: Marland Kitchen MyChart Message (if you have MyChart) OR . A paper copy in the mail If you have any lab test that is abnormal or we need to change your treatment, we will call you to review the results.  Testing/Procedures: Your physician has requested that you have a carotid duplex. This test is an ultrasound of the carotid arteries in your neck. It looks at blood flow through these arteries that supply the brain with blood. Allow one hour for this exam. There are no restrictions or special instructions.  Your physician has requested that you have an echocardiogram. Echocardiography is a painless test that uses sound waves to create images of your heart. It provides your doctor with information about the size and shape of your heart and how well your heart's chambers and valves are working. This procedure takes approximately one hour. There are no restrictions for this procedure.  Follow-Up: At [facility], you and your health needs are our priority.  As part of our continuing mission to provide you with exceptional heart care, we have created designated Provider Care Teams.  These Care Teams include your primary Cardiologist (physician) and Advanced Practice Providers (APPs -  Physician Assistants and Nurse Practitioners) who all work together to provide you with the care you need, when you need it.  Your next appointment:   12 months  The format for your next appointment:   In Person  Provider:   You may see Dr. Johnsie Cancel or one of the following Advanced Practice Providers on your designated Care Team:    Truitt Merle, NP  Cecilie Kicks, NP  Kathyrn Drown, NP

## 2018-12-01 ENCOUNTER — Ambulatory Visit (HOSPITAL_COMMUNITY)
Admission: RE | Admit: 2018-12-01 | Discharge: 2018-12-01 | Disposition: A | Discharge status: Home / Self Care | Payer: Medicare HMO | Source: Ambulatory Visit | Attending: Cardiology | Admitting: Cardiology

## 2018-12-01 ENCOUNTER — Other Ambulatory Visit: Payer: Self-pay

## 2018-12-01 ENCOUNTER — Ambulatory Visit (HOSPITAL_BASED_OUTPATIENT_CLINIC_OR_DEPARTMENT_OTHER): Payer: Medicare HMO

## 2018-12-01 DIAGNOSIS — I420 Dilated cardiomyopathy: Secondary | ICD-10-CM

## 2018-12-01 DIAGNOSIS — I6523 Occlusion and stenosis of bilateral carotid arteries: Secondary | ICD-10-CM | POA: Diagnosis not present

## 2018-12-02 DIAGNOSIS — H5203 Hypermetropia, bilateral: Secondary | ICD-10-CM | POA: Diagnosis not present

## 2019-02-14 ENCOUNTER — Other Ambulatory Visit: Payer: Self-pay | Admitting: Internal Medicine

## 2019-04-20 ENCOUNTER — Other Ambulatory Visit: Payer: Self-pay | Admitting: Family Medicine

## 2019-04-20 ENCOUNTER — Ambulatory Visit (INDEPENDENT_AMBULATORY_CARE_PROVIDER_SITE_OTHER): Payer: Medicare HMO | Admitting: Family Medicine

## 2019-04-20 ENCOUNTER — Encounter: Payer: Self-pay | Admitting: Family Medicine

## 2019-04-20 DIAGNOSIS — G8929 Other chronic pain: Secondary | ICD-10-CM | POA: Diagnosis not present

## 2019-04-20 DIAGNOSIS — M5441 Lumbago with sciatica, right side: Secondary | ICD-10-CM | POA: Diagnosis not present

## 2019-04-20 MED ORDER — GABAPENTIN 100 MG PO CAPS: 200.0000 mg | ORAL_CAPSULE | Freq: Every day | ORAL | 0 refills | Status: DC

## 2019-04-20 MED ORDER — DICLOFENAC SODIUM 1 % EX GEL: 2.0000 g | Freq: Four times a day (QID) | CUTANEOUS | 11 refills | Status: AC

## 2019-04-20 NOTE — Progress Notes (Signed)
Virtual Visit via Video Note  I connected with Dominic Shaffer on 04/20/19 at  4:15 PM EDT by a video enabled telemedicine application and verified that I am speaking with the correct person using two identifiers.  Location: Patient: Patient is in home setting alone Provider: In office   I discussed the limitations of evaluation and management by telemedicine and the availability of in person appointments. The patient expressed understanding and agreed to proceed.  History of Present Illness: Patient is a 79 year old gentleman with a history of degenerative disc disease of the lumbar spine I am not seen for greater than a year that has responded very well to gabapentin.  Patient states that the medication was helping and then he decided to discontinue it secondary to reading something about depression.  Patient discontinued it but the pain was significantly worse and thinks that actually made his mood even worse.  Patient would like to potentially     Observations/Objective: Alert and oriented, patient asked good questions about the medication today.   Assessment and Plan: 79 year old gentleman with degenerative disc disease of the lumbar spine that was very well controlled with gabapentin and low-dose.  Will restart 200 mg.  States that we can take this chronically edematous helpful.  I would like to see him in the office for in 4 to 5 weeks.  Topical anti-inflammatories also given.   Follow Up Instructions: 4 weeks in office potentially    I discussed the assessment and treatment plan with the patient. The patient was provided an opportunity to ask questions and all were answered. The patient agreed with the plan and demonstrated an understanding of the instructions.   The patient was advised to call back or seek an in-person evaluation if the symptoms worsen or if the condition fails to improve as anticipated.  I provided 21 minutes of face-to-face time during this  encounter.   Lyndal Pulley, DO

## 2019-04-23 ENCOUNTER — Ambulatory Visit (INDEPENDENT_AMBULATORY_CARE_PROVIDER_SITE_OTHER): Payer: Medicare HMO

## 2019-04-23 ENCOUNTER — Ambulatory Visit: Payer: Medicare HMO | Admitting: Family Medicine

## 2019-04-23 ENCOUNTER — Other Ambulatory Visit: Payer: Self-pay | Admitting: Family Medicine

## 2019-04-23 ENCOUNTER — Other Ambulatory Visit: Payer: Self-pay

## 2019-04-23 ENCOUNTER — Encounter: Payer: Self-pay | Admitting: Family Medicine

## 2019-04-23 VITALS — BP 128/78 | HR 70 | Ht 70.0 in | Wt 214.0 lb

## 2019-04-23 DIAGNOSIS — M25551 Pain in right hip: Secondary | ICD-10-CM | POA: Diagnosis not present

## 2019-04-23 DIAGNOSIS — M5136 Other intervertebral disc degeneration, lumbar region: Secondary | ICD-10-CM

## 2019-04-23 DIAGNOSIS — M5410 Radiculopathy, site unspecified: Secondary | ICD-10-CM

## 2019-04-23 DIAGNOSIS — M1711 Unilateral primary osteoarthritis, right knee: Secondary | ICD-10-CM | POA: Diagnosis not present

## 2019-04-23 DIAGNOSIS — M545 Low back pain: Secondary | ICD-10-CM | POA: Diagnosis not present

## 2019-04-23 MED ORDER — METHYLPREDNISOLONE ACETATE 40 MG/ML IJ SUSP
40.0000 mg | Freq: Once | INTRAMUSCULAR | Status: AC
  Administered 2019-04-23: 40 mg via INTRAMUSCULAR

## 2019-04-23 MED ORDER — KETOROLAC TROMETHAMINE 30 MG/ML IJ SOLN
30.0000 mg | Freq: Once | INTRAMUSCULAR | Status: AC
  Administered 2019-04-23: 30 mg via INTRAMUSCULAR

## 2019-04-23 NOTE — Assessment & Plan Note (Signed)
Degenerative disc disease of the lumbar spine.  Mild exacerbation with radicular symptoms.  Increase gabapentin to 300 mg if necessary, we discussed the potential side effects.  X-rays ordered today secondary to his history of cancer.  Toradol and Depo-Medrol given today to help with some of the pain relief.  X-rays pending will allow patient to work remotely..  Follow-up again in 4 to 6 weeks

## 2019-04-23 NOTE — Assessment & Plan Note (Signed)
Patient is having some mild increase in radiculopathy.  Can increase gabapentin to 300 mg and discussed medication management.  This is a chronic problem with exacerbation.  Toradol and Depo-Medrol given for pain relief and will avoid oral anti-inflammatories with patient being on a blood thinner.  This is a social determinants of health.  Discussed topical anti-inflammatories.  X-rays ordered today with patient having a history of malignant neoplasm of the prostate and we will get x-rays of the back and the hip.  Follow-up again to 3 weeks to see how patient is responding

## 2019-04-23 NOTE — Progress Notes (Signed)
[place] Phone: [phone] Subjective:   Dominic Shaffer, am serving as a scribe for Dr. Hulan Saas. This visit occurred during the SARS-CoV-2 public health emergency.  Safety protocols were in place, including screening questions prior to the visit, additional usage of staff PPE, and extensive cleaning of exam room while observing appropriate contact time as indicated for disinfecting solutions.    I'm seeing this patient by the request  of:  Plotnikov, Evie Lacks, MD  CC: Low back pain  [id number]   04/20/2019 79 year old gentleman with degenerative disc disease of the lumbar spine that was very well controlled with gabapentin and low-dose.  Will restart 200 mg.  States that we can take this chronically edematous helpful.  I would like to see him in the office for in 4 to 5 weeks.  Topical anti-inflammatories also given.   Update 04/23/2019 Dominic Shaffer is a 79 y.o. male coming in with complaint of back pain. Patient states that his pain continues on right side. Pain radiates down lateral aspect of leg. Has taken gabapentin now for 2 days and does not feel that the medication is helping. Has to sit often due to pain with walking. Ambulates with cane.  Patient is here because he is just not quite having control of the pain at the moment.  All the pain seems to be posteriorly.  Has only started the gabapentin for 2 days and has not noticed significant improvement at the moment.  Denies any constant numbness.     Past Medical History:  Diagnosis Date  . Asymptomatic stenosis of left carotid artery without infarction    per duplex LICA 123456 ([id number])  . At risk for sleep apnea    STOP-BANG= 4     SENT TO PCP 12-23-2013  . BPH with urinary obstruction    hyperplasia  . Colon polyps   . Congenital renal cyst, single   . Coronary artery disease    cardiologist--  dr Johnsie Cancel-- myoview 07-05-2010  poss. small inferior wall infact/  Shaffer ischemia/ ef 51%/  cardic CT score 28 and <50% LAD/D! disease  . Depression   . Diverticulitis   . ED (erectile dysfunction)    secondary arterial insuffiency  . Esophageal achalasia    s/p multiple dilatations and botox injection tx and surgical intervention 06-17-2011  [facility] Myotomy  . Hematospermia   . History of DVT of lower extremity    06/ 2012  . History of esophageal dilatation    and [facility] tx[facility] at [facility]  . Hypertension   . Internal hemorrhoid 2003   Dr. Henrene Pastor  . LBBB (left bundle branch block)   . LBP (low back pain)   . Nocturia   . Prostate cancer ([place])   . Wears glasses    Past Surgical History:  Procedure Laterality Date  . CARDIOVASCULAR STRESS TEST  07-05-2010  dr Johnsie Cancel   Adenosine nuclear study/  Shaffer significant ST segment change suggestive of ischemia/  possible small inferior wall infarct at mid and basal level/ ef 51%/  LBBB/  normal wall motion  . COLONOSCOPY  last one 2009  . INGUINAL HERNIA REPAIR Left 10/13/2012   Procedure: OPEN LEFT INGUINAL HERNIA REPAIR WITH ON Q PUMP;  Surgeon: Odis Hollingshead, MD;  Location: [facility];  Service: General;  Laterality: Left;  . INSERTION OF MESH Left 10/13/2012   Procedure: INSERTION OF MESH;  Surgeon: Odis Hollingshead, MD;  Location: [facility];  Service: General;  Laterality: Left;  . IR RADIOLOGIST EVAL & MGMT  10/11/2016  . LAPAROSCOPIC HELLER MYOTOMY  06-16-2001   w/ intraoperative upper endoscopy [place]idence (for achalasia)  . RADIOACTIVE SEED IMPLANT N/A 12/31/2013   Procedure: RADIOACTIVE SEED IMPLANT;  Surgeon: Malka So, MD;  Location[facility];  Service: Urology;  Laterality: N/A;  seeds implanted 78 Shaffer seeds found in bladder  . TRANSTHORACIC ECHOCARDIOGRAM  07-05-2010   mild LVH/ septal motion consistent w/ LBBB/ ef 45-50%/  diffuse hypokinesis/  grade I diastolic dysfunction/ mild LAE/  trivial TR  . UMBILICAL HERNIA REPAIR  11-11-2005   Social  History   Socioeconomic History  . Marital status: Married    Spouse name: Not on file  . Number of children: 3  . Years of education: Not on file  . Highest education level: Not on file  Occupational History  . Occupation: HR  Tobacco Use  . Smoking status: Former Smoker    Packs/day: 0.25    Years: 0.50    Pack years: 0.12    Types: Cigarettes    Quit date: 07/03/1965    Years since quitting: 53.8  . Smokeless tobacco: Never Used  Substance and Sexual Activity  . Alcohol use: Shaffer  . Drug use: Shaffer  . Sexual activity: Not on file    Comment: one pack of cigarettes would last a week  Other Topics Concern  . Not on file  Social History Narrative   Regular exercise-Shaffer   Social Determinants of Health   Financial Resource Strain:   . Difficulty of Paying Living Expenses:   Food Insecurity:   . Worried About Charity fundraiser in the Last Year:   . Arboriculturist in the Last Year:   Transportation Needs:   . Film/video editor (Medical):   Marland Kitchen Lack of Transportation (Non-Medical):   Physical Activity:   . Days of Exercise per Week:   . Minutes of Exercise per Session:   Stress:   . Feeling of Stress :   Social Connections:   . Frequency of Communication with Friends and Family:   . Frequency of Social Gatherings with Friends and Family:   . Attends Religious Services:   . Active Member of Clubs or Organizations:   . Attends Archivist Meetings:   Marland Kitchen Marital Status:    Allergies  Allergen Reactions  . Iodinated Diagnostic Agents Hives  . Shellfish Allergy Hives    MAINLY -SHRIMP   Family History  Problem Relation Age of Onset  . Heart disease Mother 26       CAD  . Kidney disease Mother   . Cancer Father 7       bladder ca; passed in 1999  . Hypertension Other      Current Outpatient Medications (Cardiovascular):  .  amLODipine (NORVASC) 5 MG tablet, Take 1 tablet (5 mg total) by mouth daily. Marland Kitchen  terazosin (HYTRIN) 10 MG capsule, TAKE 1 CAPSULE  AT BEDTIME   Current Outpatient Medications (Analgesics):  .  HYDROcodone-acetaminophen (NORCO) 5-325 MG tablet, Take 1 tablet by mouth every 6 (six) hours as needed for severe pain.  Current Outpatient Medications (Hematological):  .  apixaban (ELIQUIS) 5 MG TABS tablet, TAKE 1 TABLET (5 MG TOTAL) BY MOUTH 2 (TWO) TIMES DAILY. .  IRON PO, Take 65 mg by mouth daily.  Current Outpatient Medications (Other):  .  diclofenac Sodium (VOLTAREN) 1 % GEL, Apply  2 g topically 4 (four) times daily. To affected joint. .  famotidine (PEPCID) 40 MG tablet, Take 1 tablet (40 mg total) by mouth daily. Marland Kitchen  gabapentin (NEURONTIN) 100 MG capsule, Take 2 capsules (200 mg total) by mouth at bedtime. .  Multiple Vitamin (MULTIVITAMIN) tablet, Take 1 tablet by mouth daily. .  pantoprazole (PROTONIX) 40 MG tablet, Take 1 tablet (40 mg total) by mouth daily. .  penicillin v potassium (VEETID) 500 MG tablet, Take as directed .  triamcinolone (KENALOG) 0.025 % ointment, Apply topically 3 (three) times daily.   Reviewed prior external information including notes and imaging from  primary care provider As well as notes that were available from care everywhere and other healthcare systems.  Past medical history, social, surgical and family history all reviewed in electronic medical record.  Shaffer pertanent information unless stated regarding to the chief complaint.   Review of Systems:  Shaffer headache, visual changes, nausea, vomiting, diarrhea, constipation, dizziness, abdominal pain, skin rash, fevers, chills, night sweats, weight loss, swollen lymph nodes,, joint swelling, chest pain, shortness of breath, mood changes. POSITIVE muscle aches, body aches  Objective  Blood pressure 128/78, pulse 70, height 5\' 10"  (1.778 m), weight 214 lb (97.1 kg), SpO2 97 %.   General: Shaffer apparent distress alert and oriented x3 mood and affect normal, dressed appropriately.  HEENT: Pupils equal, extraocular movements intact   Respiratory: Patient's speak in full sentences and does not appear short of breath  Cardiovascular: Shaffer lower extremity edema, non tender, Shaffer erythema  Neuro: Cranial nerves II through XII are intact, neurovascularly intact in all extremities with 2+ DTRs and 2+ pulses.  Gait antalgic walking with the aid of a cane Back exam does have severe loss of lordosis and patient does have some degenerative scoliosis noted.  Patient does have some mild radicular symptoms with a straight leg test on the right side.  Some mild limited range of motion of the hip with internal and external range of motion.  Very mild signs of impingement noted.   Impression and Recommendations:     This case required medical decision making of moderate complexity. The above documentation has been reviewed and is accurate and complete Dominic Pulley, DO       Note: This dictation was prepared with Dragon dictation along with smaller phrase technology. Any transcriptional errors that result from this process are unintentional.

## 2019-04-23 NOTE — Patient Instructions (Signed)
Good to see you Take up to 3 gabapentin at night Exercise 3 times a week See me again in 3-4 weeks

## 2019-04-27 ENCOUNTER — Ambulatory Visit: Payer: Medicare HMO | Admitting: Family Medicine

## 2019-05-18 ENCOUNTER — Ambulatory Visit: Payer: Medicare HMO | Admitting: Family Medicine

## 2019-07-27 ENCOUNTER — Ambulatory Visit: Payer: Medicare HMO | Admitting: Family Medicine

## 2019-09-21 ENCOUNTER — Telehealth: Payer: Self-pay | Admitting: Internal Medicine

## 2019-09-21 NOTE — Telephone Encounter (Signed)
New Message:   1.Medication Requested: amLODipine (NORVASC) 5 MG tablet apixaban (ELIQUIS) 5 MG TABS tablet 2. Pharmacy (Name, [place], [facility]): [facility] [place], [place] 3. On Med List: yes  4. Last Visit with PCP: 03/10/18  5. Next visit date with PCP: None   Agent: Please be advised that RX refills may take up to 3 business days. We ask that you follow-up with your pharmacy.

## 2019-09-23 NOTE — Telephone Encounter (Signed)
Pt has and appt 09/24/2019 for his med refill

## 2019-09-24 ENCOUNTER — Ambulatory Visit (INDEPENDENT_AMBULATORY_CARE_PROVIDER_SITE_OTHER): Payer: Medicare HMO | Admitting: Internal Medicine

## 2019-09-24 ENCOUNTER — Ambulatory Visit (INDEPENDENT_AMBULATORY_CARE_PROVIDER_SITE_OTHER): Payer: Medicare HMO

## 2019-09-24 ENCOUNTER — Other Ambulatory Visit: Payer: Self-pay

## 2019-09-24 ENCOUNTER — Encounter: Payer: Self-pay | Admitting: Internal Medicine

## 2019-09-24 VITALS — BP 130/60 | HR 64 | Ht 70.0 in | Wt 210.0 lb

## 2019-09-24 VITALS — BP 130/60 | HR 64 | Temp 98.1°F | Ht 70.0 in | Wt 210.0 lb

## 2019-09-24 DIAGNOSIS — Z0000 Encounter for general adult medical examination without abnormal findings: Secondary | ICD-10-CM

## 2019-09-24 DIAGNOSIS — Z86010 Personal history of colonic polyps: Secondary | ICD-10-CM | POA: Diagnosis not present

## 2019-09-24 DIAGNOSIS — I82412 Acute embolism and thrombosis of left femoral vein: Secondary | ICD-10-CM

## 2019-09-24 DIAGNOSIS — I2510 Atherosclerotic heart disease of native coronary artery without angina pectoris: Secondary | ICD-10-CM

## 2019-09-24 DIAGNOSIS — I10 Essential (primary) hypertension: Secondary | ICD-10-CM | POA: Diagnosis not present

## 2019-09-24 DIAGNOSIS — I2583 Coronary atherosclerosis due to lipid rich plaque: Secondary | ICD-10-CM | POA: Diagnosis not present

## 2019-09-24 DIAGNOSIS — Z23 Encounter for immunization: Secondary | ICD-10-CM | POA: Diagnosis not present

## 2019-09-24 LAB — COMPLETE METABOLIC PANEL WITH GFR
AG Ratio: 1.1 (calc) (ref 1.0–2.5)
ALT: 12 U/L (ref 9–46)
AST: 12 U/L (ref 10–35)
Albumin: 3.5 g/dL — ABNORMAL LOW (ref 3.6–5.1)
Alkaline phosphatase (APISO): 74 U/L (ref 35–144)
BUN: 15 mg/dL (ref 7–25)
CO2: 28 mmol/L (ref 20–32)
Calcium: 8.9 mg/dL (ref 8.6–10.3)
Chloride: 105 mmol/L (ref 98–110)
Creat: 1.01 mg/dL (ref 0.70–1.18)
GFR, Est African American: 83 mL/min/{1.73_m2} (ref >=60)
GFR, Est Non African American: 71 mL/min/{1.73_m2} (ref >=60)
Globulin: 3.1 g/dL (calc) (ref 1.9–3.7)
Glucose, Bld: 83 mg/dL (ref 65–99)
Potassium: 4.4 mmol/L (ref 3.5–5.3)
Sodium: 138 mmol/L (ref 135–146)
Total Bilirubin: 0.4 mg/dL (ref 0.2–1.2)
Total Protein: 6.6 g/dL (ref 6.1–8.1)

## 2019-09-24 MED ORDER — APIXABAN 5 MG PO TABS: ORAL_TABLET | ORAL | 3 refills | Status: DC

## 2019-09-24 MED ORDER — TERAZOSIN HCL 10 MG PO CAPS: 10.0000 mg | ORAL_CAPSULE | Freq: Every day | ORAL | 3 refills | Status: DC

## 2019-09-24 MED ORDER — AMLODIPINE BESYLATE 5 MG PO TABS: 5.0000 mg | ORAL_TABLET | Freq: Every day | ORAL | 3 refills | Status: DC

## 2019-09-24 NOTE — Progress Notes (Signed)
Subjective:  Patient ID: Dominic Shaffer, male    DOB: Apr 29, 1940  Age: 79 y.o. MRN: [id number]  CC: No chief complaint on file.   HPI Dominic Shaffer presents for anticoagulation, GERD, HTN f/u  Outpatient Medications Prior to Visit  Medication Sig Dispense Refill  . diclofenac Sodium (VOLTAREN) 1 % GEL Apply 2 g topically 4 (four) times daily. To affected joint. 150 g 11  . famotidine (PEPCID) 40 MG tablet Take 1 tablet (40 mg total) by mouth daily. 90 tablet 3  . gabapentin (NEURONTIN) 100 MG capsule Take 2 capsules (200 mg total) by mouth at bedtime. 60 capsule 0  . HYDROcodone-acetaminophen (NORCO) 5-325 MG tablet Take 1 tablet by mouth every 6 (six) hours as needed for severe pain. 20 tablet 0  . IRON PO Take 65 mg by mouth daily.    . Multiple Vitamin (MULTIVITAMIN) tablet Take 1 tablet by mouth daily.    . pantoprazole (PROTONIX) 40 MG tablet Take 1 tablet (40 mg total) by mouth daily. 90 tablet 3  . penicillin v potassium (VEETID) 500 MG tablet Take as directed    . triamcinolone (KENALOG) 0.025 % ointment Apply topically 3 (three) times daily. 80 g 1  . amLODipine (NORVASC) 5 MG tablet Take 1 tablet (5 mg total) by mouth daily. 90 tablet 3  . apixaban (ELIQUIS) 5 MG TABS tablet TAKE 1 TABLET (5 MG TOTAL) BY MOUTH 2 (TWO) TIMES DAILY. 180 tablet 3  . terazosin (HYTRIN) 10 MG capsule TAKE 1 CAPSULE AT BEDTIME 90 capsule 3   No facility-administered medications prior to visit.    ROS: Review of Systems  Constitutional: Negative for appetite change, fatigue and unexpected weight change.  HENT: Negative for congestion, nosebleeds, sneezing, sore throat and trouble swallowing.   Eyes: Negative for itching and visual disturbance.  Respiratory: Negative for cough.   Cardiovascular: Negative for chest pain, palpitations and leg swelling.  Gastrointestinal: Negative for abdominal distention, blood in stool, diarrhea and nausea.  Genitourinary: Negative for frequency and  hematuria.  Musculoskeletal: Negative for back pain, gait problem, joint swelling and neck pain.  Skin: Negative for rash.  Neurological: Negative for dizziness, tremors, speech difficulty and weakness.  Psychiatric/Behavioral: Negative for agitation, dysphoric mood, sleep disturbance and suicidal ideas. The patient is not nervous/anxious.     Objective:  BP 130/60 (BP Location: Right Arm, Patient Position: Sitting, Cuff Size: Large)   Pulse 64   Temp 98.1 F (36.7 C) (Oral)   Ht 5\' 10"  (1.778 m)   Wt 210 lb (95.3 kg)   SpO2 96%   BMI 30.13 kg/m   BP Readings from Last 3 Encounters:  09/24/19 130/60  04/23/19 128/78  11/24/18 (!) 122/54    Wt Readings from Last 3 Encounters:  09/24/19 210 lb (95.3 kg)  04/23/19 214 lb (97.1 kg)  11/24/18 214 lb (97.1 kg)    Physical Exam Constitutional:      General: He is not in acute distress.    Appearance: He is well-developed.     Comments: NAD  Eyes:     Conjunctiva/sclera: Conjunctivae normal.     Pupils: Pupils are equal, round, and reactive to light.  Neck:     Thyroid: No thyromegaly.     Vascular: No JVD.  Cardiovascular:     Rate and Rhythm: Normal rate and regular rhythm.     Heart sounds: Normal heart sounds. No murmur heard.  No friction rub. No gallop.   Pulmonary:  Effort: Pulmonary effort is normal. No respiratory distress.     Breath sounds: Normal breath sounds. No wheezing or rales.  Chest:     Chest wall: No tenderness.  Abdominal:     General: Bowel sounds are normal. There is no distension.     Palpations: Abdomen is soft. There is no mass.     Tenderness: There is no abdominal tenderness. There is no guarding or rebound.  Musculoskeletal:        General: No tenderness. Normal range of motion.     Cervical back: Normal range of motion.  Lymphadenopathy:     Cervical: No cervical adenopathy.  Skin:    General: Skin is warm and dry.     Findings: No rash.  Neurological:     Mental Status: He is  alert and oriented to person, place, and time.     Cranial Nerves: No cranial nerve deficit.     Motor: No abnormal muscle tone.     Coordination: Coordination normal.     Gait: Gait normal.     Deep Tendon Reflexes: Reflexes are normal and symmetric.  Psychiatric:        Behavior: Behavior normal.        Thought Content: Thought content normal.        Judgment: Judgment normal.     Lab Results  Component Value Date   WBC 12.4 (H) 03/10/2018   HGB 13.5 03/10/2018   HCT 41.5 03/10/2018   PLT 202.0 03/10/2018   GLUCOSE 86 03/10/2018   CHOL 146 03/10/2018   TRIG 146.0 03/10/2018   HDL 31.10 (L) 03/10/2018   LDLCALC 86 03/10/2018   ALT 10 03/10/2018   AST 11 03/10/2018   NA 138 03/10/2018   K 3.6 03/10/2018   CL 104 03/10/2018   CREATININE 1.11 03/10/2018   BUN 14 03/10/2018   CO2 25 03/10/2018   TSH 1.84 03/10/2018   PSA 0.01 (L) 08/13/2017   INR 0.99 10/25/2016    ECHOCARDIOGRAM COMPLETE  Result Date: 12/01/2018   ECHOCARDIOGRAM REPORT   Patient Name:   FAUSTO SAMPEDRO [facility] Date of Exam: 12/01/2018 Medical Rec #:  [id number]            Height:       70.0 in Accession #:    [id number]           Weight:       214.0 lb Date of Birth:  1940-07-17            BSA:          2.15 m Patient Age:    78 years             BP:           122/54 mmHg Patient Gender: M                    HR:           62 bpm. Exam Location:  [place] Procedure: 2D Echo, 3D Echo, Cardiac Doppler, Color Doppler and Strain Analysis Indications:    I42 Dilated cardiomyopathy  History:        Patient has prior history of Echocardiogram examinations, most                 recent 07/05/2010. CAD, DVT; Arrythmias:LBBB Risk                 Factors:Hypertension and Former Smoker. Edema.  Sonographer:    Rozelle Logan  Cletis Media, RDCS Referring Phys: [place]  1. Left ventricular ejection fraction, by visual estimation, is 55 to 60%. The left ventricle has normal function. There is mildly increased left  ventricular hypertrophy.  2. Abnormal septal motion consistent with left bundle branch block.  3. Left ventricular diastolic parameters are consistent with Grade I diastolic dysfunction (impaired relaxation).  4. Global right ventricle has normal systolic function.The right ventricular size is normal. No increase in right ventricular wall thickness.  5. Left atrial size was normal.  6. Right atrial size was normal.  7. The mitral valve is abnormal. Trace mitral valve regurgitation.  8. The tricuspid valve is grossly normal. Tricuspid valve regurgitation is trivial.  9. The aortic valve is tricuspid. Aortic valve regurgitation is not visualized. 10. The pulmonic valve was grossly normal. Pulmonic valve regurgitation is not visualized. 11. The inferior vena cava is normal in size with greater than 50% respiratory variability, suggesting right atrial pressure of 3 mmHg. In comparison to the previous echocardiogram(s): 07/05/10 EF 45-50%. FINDINGS  Left Ventricle: Left ventricular ejection fraction, by visual estimation, is 55 to 60%. The left ventricle has normal function. There is mildly increased left ventricular hypertrophy. Abnormal (paradoxical) septal motion, consistent with left bundle branch block. Left ventricular diastolic parameters are consistent with Grade I diastolic dysfunction (impaired relaxation). Indeterminate filling pressures. Right Ventricle: The right ventricular size is normal. No increase in right ventricular wall thickness. Global RV systolic function is has normal systolic function. Left Atrium: Left atrial size was normal in size. Right Atrium: Right atrial size was normal in size Pericardium: There is no evidence of pericardial effusion. Mitral Valve: The mitral valve is abnormal. There is mild thickening of the mitral valve leaflet(s). Trace mitral valve regurgitation. Tricuspid Valve: The tricuspid valve is grossly normal. Tricuspid valve regurgitation is trivial. Aortic Valve: The aortic  valve is tricuspid. Aortic valve regurgitation is not visualized. Pulmonic Valve: The pulmonic valve was grossly normal. Pulmonic valve regurgitation is not visualized. Aorta: The aortic root and ascending aorta are structurally normal, with no evidence of dilitation. Venous: The inferior vena cava is normal in size with greater than 50% respiratory variability, suggesting right atrial pressure of 3 mmHg. IAS/Shunts: No atrial level shunt detected by color flow Doppler.  LEFT VENTRICLE PLAX 2D LVIDd:         6.20 cm  Diastology LVIDs:         4.30 cm  LV e' lateral:   6.75 cm/s LV PW:         1.20 cm  LV E/e' lateral: 10.8 LV IVS:        1.20 cm  LV e' medial:    6.23 cm/s LVOT diam:     2.40 cm  LV E/e' medial:  11.7 LV SV:         111 ml LV SV Index:   50.06    2D Longitudinal Strain LVOT Area:     4.52 cm 2D Strain GLS (A2C):   -23.2 %                         2D Strain GLS (A3C):   -23.0 %                         2D Strain GLS (A4C):   -22.0 %  2D Strain GLS Avg:     -22.7 %                          3D Volume EF:                         3D EF:        57 %                         LV EDV:       213 ml                         LV ESV:       92 ml                         LV SV:        121 ml RIGHT VENTRICLE RV Basal diam:  4.20 cm RV S prime:     12.00 cm/s TAPSE (M-mode): 3.1 cm LEFT ATRIUM             Index       RIGHT ATRIUM           Index LA diam:        3.70 cm 1.72 cm/m  RA Pressure: 3.00 mmHg LA Vol (A2C):   41.2 ml 19.18 ml/m RA Area:     11.40 cm LA Vol (A4C):   41.6 ml 19.36 ml/m RA Volume:   25.20 ml  11.73 ml/m LA Biplane Vol: 42.1 ml 19.60 ml/m  AORTIC VALVE LVOT Vmax:   97.50 cm/s LVOT Vmean:  62.800 cm/s LVOT VTI:    0.212 m  AORTA Ao Root diam: 3.50 cm Ao Asc diam:  3.30 cm MITRAL VALVE                        TRICUSPID VALVE                                     Estimated RAP:  3.00 mmHg  MV Decel Time: 292 msec             SHUNTS MV E velocity: 73.20 cm/s 103 cm/s   Systemic VTI:  0.21 m MV A velocity: 93.10 cm/s 70.3 cm/s Systemic Diam: 2.40 cm MV E/A ratio:  0.79       1.5  Lyman Bishop MD Electronically signed by Lyman Bishop MD Signature Date/Time: 12/01/2018/3:17:32 PM    Final    VAS US CAROTID  Result Date: 12/01/2018 Carotid Arterial Duplex Study Indications:       Follow-up; Carotid stenosis in 2013. Patient denies any                    cerebrovascular symptoms. Risk Factors:      Hypertension, past history of smoking, coronary artery                    disease. Comparison Study:  In 06/2011, a carotid duplex showed RICA velocities of 90/23                    cm/sec and LICA velocities of 850/27 cm/sec. Performing Technologist: Mariane Masters RVT  Examination Guidelines: A complete  evaluation includes B-mode imaging, spectral Doppler, color Doppler, and power Doppler as needed of all accessible portions of each vessel. Bilateral testing is considered an integral part of a complete examination. Limited examinations for reoccurring indications may be performed as noted.  Right Carotid Findings: +----------+--------+--------+--------+------------------+--------+           PSV cm/sEDV cm/sStenosisPlaque DescriptionComments +----------+--------+--------+--------+------------------+--------+ CCA Prox  125     22                                         +----------+--------+--------+--------+------------------+--------+ CCA Distal72      13                                         +----------+--------+--------+--------+------------------+--------+ ICA Prox  50      13                                         +----------+--------+--------+--------+------------------+--------+ ICA Mid   90      24      Normal                    tortuous +----------+--------+--------+--------+------------------+--------+ ICA Distal89      23                                         +----------+--------+--------+--------+------------------+--------+  ECA       59      6                                          +----------+--------+--------+--------+------------------+--------+ +----------+--------+-------+----------------+-------------------+           PSV cm/sEDV cmsDescribe        Arm Pressure (mmHG) +----------+--------+-------+----------------+-------------------+ [id number]            Multiphasic, [id number]                 +----------+--------+-------+----------------+-------------------+ +---------+--------+--+--------+-+---------+ VertebralPSV cm/s55EDV cm/s9Antegrade +---------+--------+--+--------+-+---------+  Left Carotid Findings: +----------+--------+--------+--------+------------------+--------+           PSV cm/sEDV cm/sStenosisPlaque DescriptionComments +----------+--------+--------+--------+------------------+--------+ CCA Prox  146     24                                         +----------+--------+--------+--------+------------------+--------+ CCA Distal83      20                                         +----------+--------+--------+--------+------------------+--------+ ICA Prox  60      13                                         +----------+--------+--------+--------+------------------+--------+ ICA Mid   93      27  Normal                    tortuous +----------+--------+--------+--------+------------------+--------+ ICA Distal112     34                                tortuous +----------+--------+--------+--------+------------------+--------+ ECA       76      10                                         +----------+--------+--------+--------+------------------+--------+ +----------+--------+--------+----------------+-------------------+           PSV cm/sEDV cm/sDescribe        Arm Pressure (mmHG) +----------+--------+--------+----------------+-------------------+ Subclavian120             Multiphasic, [id number]                  +----------+--------+--------+----------------+-------------------+ +---------+--------+---+--------+--+---------+ VertebralPSV cm/s101EDV cm/s19Antegrade +---------+--------+---+--------+--+---------+  Summary: Right Carotid: There is no evidence of stenosis in the right ICA. The                extracranial vessels were near-normal with only minimal wall                thickening or plaque. Left Carotid: There is no evidence of stenosis in the left ICA. The extracranial               vessels were near-normal with only minimal wall thickening or               plaque. Vertebrals:  Bilateral vertebral arteries demonstrate antegrade flow. Subclavians: Normal flow hemodynamics were seen in bilateral subclavian              arteries. *See table(s) above for measurements and observations.  Electronically signed by Jenkins Rouge MD on 12/01/2018 at 3:31:22 PM.    Final     Assessment & Plan:   There are no diagnoses linked to this encounter.   Meds ordered this encounter  Medications  . amLODipine (NORVASC) 5 MG tablet    Sig: Take 1 tablet (5 mg total) by mouth daily.    Dispense:  90 tablet    Refill:  3  . apixaban (ELIQUIS) 5 MG TABS tablet    Sig: TAKE 1 TABLET (5 MG TOTAL) BY MOUTH 2 (TWO) TIMES DAILY.    Dispense:  180 tablet    Refill:  3  . terazosin (HYTRIN) 10 MG capsule    Sig: Take 1 capsule (10 mg total) by mouth at bedtime.    Dispense:  90 capsule    Refill:  3     Follow-up: No follow-ups on file.  Walker Kehr, MD

## 2019-09-24 NOTE — Assessment & Plan Note (Signed)
On meds

## 2019-09-24 NOTE — Assessment & Plan Note (Signed)
Xarelto

## 2019-09-24 NOTE — Patient Instructions (Addendum)
[name] , Thank you for taking time to come for your Medicare Wellness Visit. I appreciate your ongoing commitment to your health goals. Please review the following plan we discussed and let me know if I can assist you in the future.   Screening recommendations/referrals: Colonoscopy: 10/05/2014; due every 10 years Recommended yearly ophthalmology/optometry visit for glaucoma screening and checkup Recommended yearly dental visit for hygiene and checkup  Vaccinations: Influenza vaccine: 10/2018 Pneumococcal vaccine: never done Tdap vaccine: never done Shingles vaccine: 09/24/2019; need second vaccine   Covid-19: completed  Advanced directives: Please bring a copy of your health care power of attorney and living will to the office at your convenience.  Conditions/risks identified: Yes. Reviewed health maintenance screenings with patient today and relevant education, vaccines, and/or referrals were provided. Continue doing brain stimulating activities (puzzles, reading, adult coloring books, staying active) to keep memory sharp. Continue to eat heart healthy diet (full of fruits, vegetables, whole grains, lean protein, water--limit salt, fat, and sugar intake) and increase physical activity as tolerated.  Next appointment:  Please schedule your next Medicare Wellness Visit with your Nurse Health Advisor in 1 year. Preventive Care 48 Years and Older, Male Preventive care refers to lifestyle choices and visits with your health care provider that can promote health and wellness. What does preventive care include?  A yearly physical exam. This is also called an annual well check.  Dental exams once or twice a year.  Routine eye exams. Ask your health care provider how often you should have your eyes checked.  Personal lifestyle choices, including:  Daily care of your teeth and gums.  Regular physical activity.  Eating a healthy diet.  Avoiding tobacco and drug use.  Limiting  alcohol use.  Practicing safe sex.  Taking low doses of aspirin every day.  Taking vitamin and mineral supplements as recommended by your health care provider. What happens during an annual well check? The services and screenings done by your health care provider during your annual well check will depend on your age, overall health, lifestyle risk factors, and family history of disease. Counseling  Your health care provider may ask you questions about your:  Alcohol use.  Tobacco use.  Drug use.  Emotional well-being.  Home and relationship well-being.  Sexual activity.  Eating habits.  History of falls.  Memory and ability to understand (cognition).  Work and work Statistician. Screening  You may have the following tests or measurements:  Height, weight, and BMI.  Blood pressure.  Lipid and cholesterol levels. These may be checked every 5 years, or more frequently if you are over 79 years old.  Skin check.  Lung cancer screening. You may have this screening every year starting at age 61 if you have a 30-pack-year history of smoking and currently smoke or have quit within the past 15 years.  Fecal occult blood test (FOBT) of the stool. You may have this test every year starting at age 43.  Flexible sigmoidoscopy or colonoscopy. You may have a sigmoidoscopy every 5 years or a colonoscopy every 10 years starting at age 47.  Prostate cancer screening. Recommendations will vary depending on your family history and other risks.  Hepatitis C blood test.  Hepatitis B blood test.  Sexually transmitted disease (STD) testing.  Diabetes screening. This is done by checking your blood sugar (glucose) after you have not eaten for a while (fasting). You may have this done every 1-3 years.  Abdominal aortic aneurysm (AAA) screening. You may need  this if you are a current or former smoker.  Osteoporosis. You may be screened starting at age 55 if you are at high risk. Talk  with your health care provider about your test results, treatment options, and if necessary, the need for more tests. Vaccines  Your health care provider may recommend certain vaccines, such as:  Influenza vaccine. This is recommended every year.  Tetanus, diphtheria, and acellular pertussis (Tdap, Td) vaccine. You may need a Td booster every 10 years.  Zoster vaccine. You may need this after age 67.  Pneumococcal 13-valent conjugate (PCV13) vaccine. One dose is recommended after age 33.  Pneumococcal polysaccharide (PPSV23) vaccine. One dose is recommended after age 62. Talk to your health care provider about which screenings and vaccines you need and how often you need them. This information is not intended to replace advice given to you by your health care provider. Make sure you discuss any questions you have with your health care provider. Document Released: 02/11/2015 Document Revised: 10/05/2015 Document Reviewed: 11/16/2014 Elsevier Interactive Patient Education  2017 [place] Prevention in the Home Falls can cause injuries. They can happen to people of all ages. There are many things you can do to make your home safe and to help prevent falls. What can I do on the outside of my home?  Regularly fix the edges of walkways and driveways and fix any cracks.  Remove anything that might make you trip as you walk through a door, such as a raised step or threshold.  Trim any bushes or trees on the path to your home.  Use bright outdoor lighting.  Clear any walking paths of anything that might make someone trip, such as rocks or tools.  Regularly check to see if handrails are loose or broken. Make sure that both sides of any steps have handrails.  Any raised decks and porches should have guardrails on the edges.  Have any leaves, snow, or ice cleared regularly.  Use sand or salt on walking paths during winter.  Clean up any spills in your garage right away. This  includes oil or grease spills. What can I do in the bathroom?  Use night lights.  Install grab bars by the toilet and in the tub and shower. Do not use towel bars as grab bars.  Use non-skid mats or decals in the tub or shower.  If you need to sit down in the shower, use a plastic, non-slip stool.  Keep the floor dry. Clean up any water that spills on the floor as soon as it happens.  Remove soap buildup in the tub or shower regularly.  Attach bath mats securely with double-sided non-slip rug tape.  Do not have throw rugs and other things on the floor that can make you trip. What can I do in the bedroom?  Use night lights.  Make sure that you have a light by your bed that is easy to reach.  Do not use any sheets or blankets that are too big for your bed. They should not hang down onto the floor.  Have a firm chair that has side arms. You can use this for support while you get dressed.  Do not have throw rugs and other things on the floor that can make you trip. What can I do in the kitchen?  Clean up any spills right away.  Avoid walking on wet floors.  Keep items that you use a lot in easy-to-reach places.  If you  need to reach something above you, use a strong step stool that has a grab bar.  Keep electrical cords out of the way.  Do not use floor polish or wax that makes floors slippery. If you must use wax, use non-skid floor wax.  Do not have throw rugs and other things on the floor that can make you trip. What can I do with my stairs?  Do not leave any items on the stairs.  Make sure that there are handrails on both sides of the stairs and use them. Fix handrails that are broken or loose. Make sure that handrails are as long as the stairways.  Check any carpeting to make sure that it is firmly attached to the stairs. Fix any carpet that is loose or worn.  Avoid having throw rugs at the top or bottom of the stairs. If you do have throw rugs, attach them to the  floor with carpet tape.  Make sure that you have a light switch at the top of the stairs and the bottom of the stairs. If you do not have them, ask someone to add them for you. What else can I do to help prevent falls?  Wear shoes that:  Do not have high heels.  Have rubber bottoms.  Are comfortable and fit you well.  Are closed at the toe. Do not wear sandals.  If you use a stepladder:  Make sure that it is fully opened. Do not climb a closed stepladder.  Make sure that both sides of the stepladder are locked into place.  Ask someone to hold it for you, if possible.  Clearly mark and make sure that you can see:  Any grab bars or handrails.  First and last steps.  Where the edge of each step is.  Use tools that help you move around (mobility aids) if they are needed. These include:  Canes.  Walkers.  Scooters.  Crutches.  Turn on the lights when you go into a dark area. Replace any light bulbs as soon as they burn out.  Set up your furniture so you have a clear path. Avoid moving your furniture around.  If any of your floors are uneven, fix them.  If there are any pets around you, be aware of where they are.  Review your medicines with your doctor. Some medicines can make you feel dizzy. This can increase your chance of falling. Ask your doctor what other things that you can do to help prevent falls. This information is not intended to replace advice given to you by your health care provider. Make sure you discuss any questions you have with your health care provider. Document Released: 11/11/2008 Document Revised: 06/23/2015 Document Reviewed: 02/19/2014 Elsevier Interactive Patient Education  2017 [employer].

## 2019-09-24 NOTE — Progress Notes (Addendum)
I connected with Dominic Shaffer today by telephone and verified that I am speaking with the correct person using two identifiers. Location patient: home Location provider: work Persons participating in the virtual visit: [employer]. Meghan Tiemann, LPN   I discussed the limitations, risks, security and privacy concerns of performing an evaluation and management service by telephone and the availability of in person appointments. I also discussed with the patient that there may be a patient responsible charge related to this service. The patient expressed understanding and verbally consented to this telephonic visit.    Interactive audio and video telecommunications were attempted between this provider and patient, however failed, due to patient having technical difficulties OR patient did not have access to video capability.  We continued and completed visit with audio only.  Some vital signs may be absent or patient reported.   Time Spent with patient on telephone encounter: 20 minutes  Subjective:   Dominic Shaffer is a 79 y.o. male who presents for Medicare Annual/Subsequent preventive examination.  Review of Systems    No ROS. Medicare Wellness Visit. Cardiac Risk Factors include: advanced age (>23men, >52 women);family history of premature cardiovascular disease;hypertension;male gender     Objective:    Today's Vitals   09/24/19 1417  BP: 130/60  Pulse: 64  SpO2: 96%  Weight: 210 lb (95.3 kg)  Height: 5\' 10"  (1.778 m)  PainSc: 0-No pain   Body mass index is 30.13 kg/m.  Advanced Directives 09/24/2019 09/07/2015 07/30/2015 07/30/2015 10/05/2014 01/28/2014 12/31/2013  Does Patient Have a Medical Advance Directive? Yes Yes No;Yes No Yes Yes Yes  Type of Paramedic of [place];Living will [place];Living will [place];Living will - [place];Living will Living will;Mental  [place];Living will  Does patient want to make changes to medical advance directive? No - Patient declined No - Patient declined No - Patient declined - - No - Patient declined No - Patient declined  Copy of [place] in Chart? No - copy requested No - copy requested No - copy requested - - Yes No - copy requested    Current Medications (verified) Outpatient Encounter Medications as of 09/24/2019  Medication Sig  . amLODipine (NORVASC) 5 MG tablet Take 1 tablet (5 mg total) by mouth daily.  Dominic Shaffer apixaban (ELIQUIS) 5 MG TABS tablet TAKE 1 TABLET (5 MG TOTAL) BY MOUTH 2 (TWO) TIMES DAILY.  Dominic Shaffer diclofenac Sodium (VOLTAREN) 1 % GEL Apply 2 g topically 4 (four) times daily. To affected joint.  . famotidine (PEPCID) 40 MG tablet Take 1 tablet (40 mg total) by mouth daily.  Dominic Shaffer gabapentin (NEURONTIN) 100 MG capsule Take 2 capsules (200 mg total) by mouth at bedtime.  Dominic Shaffer HYDROcodone-acetaminophen (NORCO) 5-325 MG tablet Take 1 tablet by mouth every 6 (six) hours as needed for severe pain.  . IRON PO Take 65 mg by mouth daily.  . Multiple Vitamin (MULTIVITAMIN) tablet Take 1 tablet by mouth daily.  . pantoprazole (PROTONIX) 40 MG tablet Take 1 tablet (40 mg total) by mouth daily.  . penicillin v potassium (VEETID) 500 MG tablet Take as directed  . terazosin (HYTRIN) 10 MG capsule Take 1 capsule (10 mg total) by mouth at bedtime.  . triamcinolone (KENALOG) 0.025 % ointment Apply topically 3 (three) times daily.   No facility-administered encounter medications on file as of 09/24/2019.    Allergies (verified) Iodinated diagnostic agents and Shellfish  allergy   History: Past Medical History:  Diagnosis Date  . Asymptomatic stenosis of left carotid artery without infarction    per duplex LICA 10-93% ([id number])  . At risk for sleep apnea    STOP-BANG= 4     SENT TO PCP 12-23-2013  . BPH with urinary obstruction    hyperplasia  . Colon  polyps   . Congenital renal cyst, single   . Coronary artery disease    cardiologist--  dr Johnsie Cancel-- myoview 07-05-2010 poss. small inferior wall infact/  no ischemia/ ef 51%/  cardic CT score 28 and <50% LAD/D! disease  . Depression   . Diverticulitis   . ED (erectile dysfunction)    secondary arterial insuffiency  . Esophageal achalasia    s/p multiple dilatations and botox injection tx and surgical intervention 06-17-2011  [facility] Myotomy  . Hematospermia   . History of DVT of lower extremity    06/ 2012  . History of esophageal dilatation    and botox injection tx's at [facility] [place]  . Hypertension   . Internal hemorrhoid 2003   Dr. Henrene Pastor  . LBBB (left bundle branch block)   . LBP (low back pain)   . Nocturia   . Prostate cancer ([place])   . Wears glasses    Past Surgical History:  Procedure Laterality Date  . CARDIOVASCULAR STRESS TEST  07-05-2010  dr Johnsie Cancel   Adenosine nuclear study/  no significant ST segment change suggestive of ischemia/  possible small inferior wall infarct at mid and basal level/ ef 51%/  LBBB/  normal wall motion  . COLONOSCOPY  last one 2009  . INGUINAL HERNIA REPAIR Left 10/13/2012   Procedure: OPEN LEFT INGUINAL HERNIA REPAIR WITH ON Q PUMP;  Surgeon: Odis Hollingshead, MD;  Location: [facility];  Service: General;  Laterality: Left;  . INSERTION OF MESH Left 10/13/2012   Procedure: INSERTION OF MESH;  Surgeon: Odis Hollingshead, MD;  Location: [facility];  Service: General;  Laterality: Left;  . IR RADIOLOGIST EVAL & MGMT  10/11/2016  . LAPAROSCOPIC HELLER MYOTOMY  06-16-2001   w/ intraoperative upper endoscopy [place] (for achalasia)  . RADIOACTIVE SEED IMPLANT N/A 12/31/2013   Procedure: RADIOACTIVE SEED IMPLANT;  Surgeon: Malka So, MD;  [facility];  Service: Urology;  Laterality: N/A;  seeds implanted 78 no seeds found in bladder  . TRANSTHORACIC ECHOCARDIOGRAM  07-05-2010   mild LVH/ septal motion consistent w/ LBBB/ ef 45-50%/   diffuse hypokinesis/  grade I diastolic dysfunction/ mild LAE/  trivial TR  . UMBILICAL HERNIA REPAIR  11-11-2005   Family History  Problem Relation Age of Onset  . Heart disease Mother 33       CAD  . Kidney disease Mother   . Cancer Father 43       bladder ca; passed in 1999  . Hypertension Other    Social History   Socioeconomic History  . Marital status: Married    Spouse name: Not on file  . Number of children: 3  . Years of education: Not on file  . Highest education level: Not on file  Occupational History  . Occupation: HR  Tobacco Use  . Smoking status: Former Smoker    Packs/day: 0.25    Years: 0.50    Pack years: 0.12    Types: Cigarettes    Quit date: 07/03/1965    Years since quitting: 54.2  . Smokeless tobacco: Never Used  Substance and Sexual Activity  .  Alcohol use: No  . Drug use: No  . Sexual activity: Not on file    Comment: one pack of cigarettes would last a week  Other Topics Concern  . Not on file  Social History Narrative   Regular exercise-no   Social Determinants of Health   Financial Resource Strain: Low Risk   . Difficulty of Paying Living Expenses: Not hard at all  Food Insecurity: No Food Insecurity  . Worried About Charity fundraiser in the Last Year: Never true  . Ran Out of Food in the Last Year: Never true  Transportation Needs: No Transportation Needs  . Lack of Transportation (Medical): No  . Lack of Transportation (Non-Medical): No  Physical Activity: Sufficiently Active  . Days of Exercise per Week: 5 days  . Minutes of Exercise per Session: 30 min  Stress: No Stress Concern Present  . Feeling of Stress : Not at all  Social Connections:   . Frequency of Communication with Friends and Family: Not on file  . Frequency of Social Gatherings with Friends and Family: Not on file  . Attends Religious Services: Not on file  . Active Member of Clubs or Organizations: Not on file  . Attends Archivist Meetings: Not on  file  . Marital Status: Not on file    Tobacco Counseling Counseling given: Not Answered   Clinical Intake:  Pre-visit preparation completed: Yes  Pain : No/denies pain Pain Score: 0-No pain     Nutritional Risks: None Diabetes: No  How often do you need to have someone help you when you read instructions, pamphlets, or other written materials from your doctor or pharmacy?: 1 - Never What is the last grade level you completed in school?: Bachelor's Degree  Diabetic? no  Interpreter Needed?: No  Information entered by :: Lawrence Mitch N. Laronica Bhagat, LPN   Activities of Daily Living In your present state of health, do you have any difficulty performing the following activities: 09/24/2019  Hearing? N  Vision? N  Difficulty concentrating or making decisions? N  Walking or climbing stairs? N  Dressing or bathing? N  Doing errands, shopping? N  Preparing Food and eating ? N  Using the Toilet? N  In the past six months, have you accidently leaked urine? N  Do you have problems with loss of bowel control? N  Managing your Medications? N  Managing your Finances? N  Housekeeping or managing your Housekeeping? N  Some recent data might be hidden    Patient Care Team: Plotnikov, Evie Lacks, MD as PCP - General Aletta Edouard, MD (Radiology) Irine Seal, MD (Urology) Irene Shipper, MD as Consulting Physician (Gastroenterology) Josue Hector, MD as Consulting Physician (Cardiology)  Indicate any recent Medical Services you may have received from other than [facility] providers in the past year (date may be approximate).     Assessment:   This is a routine wellness examination for [place].  Hearing/Vision screen No exam data present  Dietary issues and exercise activities discussed: Current Exercise Habits: Home exercise routine, Type of exercise: walking, Time (Minutes): 30, Frequency (Times/Week): 5, Weekly Exercise (Minutes/Week): 150, Intensity: Moderate, Exercise limited  by: None identified  Goals    .  Client understands the importance of follow-up with providers by attending scheduled visits (pt-stated)      To lose around 5-10 pounds within the next 6-12 months.      Depression Screen PHQ 2/9 Scores 09/24/2019 03/10/2018 02/05/2017 05/28/2014 07/06/2013  PHQ - 2 Score  0 0 0 0 0    Fall Risk Fall Risk  09/24/2019 03/10/2018 02/05/2017 10/04/2015 05/28/2014  Falls in the past year? 0 0 No No No  Comment - - - Emmi Telephone Survey: data to providers prior to load -  Number falls in past yr: 0 - - - -  Injury with Fall? 0 - - - -  Follow up - Falls evaluation completed - - -    Any stairs in or around the home? Yes  If so, are there any without handrails? No  Home free of loose throw rugs in walkways, pet beds, electrical cords, etc? Yes  Adequate lighting in your home to reduce risk of falls? Yes   ASSISTIVE DEVICES UTILIZED TO PREVENT FALLS:  Life alert? No  Use of a cane, walker or w/c? No  Grab bars in the bathroom? Yes  Shower chair or bench in shower? Yes  Elevated toilet seat or a handicapped toilet? No   TIMED UP AND GO:  Was the test performed? No .  Length of time to ambulate 10 feet: 0 sec.   Gait steady and fast without use of assistive device  Cognitive Function: not indicated; patient is cogitatively intact.        Immunizations Immunization History  Administered Date(s) Administered  . Influenza-Unspecified 11/03/2015, 11/06/2017  . Moderna SARS-COVID-2 Vaccination 02/10/2019, 03/10/2019  . Zoster Recombinat (Shingrix) 09/24/2019    TDAP status: Due, Education has been provided regarding the importance of this vaccine. Advised may receive this vaccine at local pharmacy or Health Dept. Aware to provide a copy of the vaccination record if obtained from local pharmacy or Health Dept. Verbalized acceptance and understanding. Flu Vaccine status: Declined, Education has been provided regarding the importance of this vaccine but  patient still declined. Advised may receive this vaccine at local pharmacy or Health Dept. Aware to provide a copy of the vaccination record if obtained from local pharmacy or Health Dept. Verbalized acceptance and understanding. Pneumococcal vaccine status: Declined,  Education has been provided regarding the importance of this vaccine but patient still declined. Advised may receive this vaccine at local pharmacy or Health Dept. Aware to provide a copy of the vaccination record if obtained from local pharmacy or Health Dept. Verbalized acceptance and understanding.  Covid-19 vaccine status: Completed vaccines  Qualifies for Shingles Vaccine? Yes   Zostavax completed No   Shingrix Completed?: No.    Education has been provided regarding the importance of this vaccine. Patient has been advised to call insurance company to determine out of pocket expense if they have not yet received this vaccine. Advised may also receive vaccine at local pharmacy or Health Dept. Verbalized acceptance and understanding.  Screening Tests Health Maintenance  Topic Date Due  . Hepatitis C Screening  Never done  . TETANUS/TDAP  Never done  . PNA vac Low Risk Adult (1 of 2 - PCV13) Never done  . INFLUENZA VACCINE  08/30/2019  . COVID-19 Vaccine  Completed    Health Maintenance  Health Maintenance Due  Topic Date Due  . Hepatitis C Screening  Never done  . TETANUS/TDAP  Never done  . PNA vac Low Risk Adult (1 of 2 - PCV13) Never done  . INFLUENZA VACCINE  08/30/2019    Colorectal cancer screening: Completed 10/05/2014. Repeat every 10 years  Lung Cancer Screening: (Low Dose CT Chest recommended if Age 62-80 years, 30 pack-year currently smoking OR have quit w/in 15years.) does not qualify.   Lung Cancer  Screening Referral: no  Additional Screening:  Hepatitis C Screening: does qualify; Completed no  Vision Screening: Recommended annual ophthalmology exams for early detection of glaucoma and other  disorders of the eye. Is the patient up to date with their annual eye exam?  Yes  Who is the provider or what is the name of the office in which the patient attends annual eye exams? Marica Otter, MD If pt is not established with a provider, would they like to be referred to a provider to establish care? No .   Dental Screening: Recommended annual dental exams for proper oral hygiene  Community Resource Referral / Chronic Care Management: CRR required this visit?  No   CCM required this visit?  No      Plan:     I have personally reviewed and noted the following in the patient's chart:   . Medical and social history . Use of alcohol, tobacco or illicit drugs  . Current medications and supplements . Functional ability and status . Nutritional status . Physical activity . Advanced directives . List of other physicians . Hospitalizations, surgeries, and ER visits in previous 12 months . Vitals . Screenings to include cognitive, depression, and falls . Referrals and appointments  In addition, I have reviewed and discussed with patient certain preventive protocols, quality metrics, and best practice recommendations. A written personalized care plan for preventive services as well as general preventive health recommendations were provided to patient.     Sheral Flow, LPN   07/25/348   Nurse Notes:  Patient is cogitatively intact. There were no vitals filed for this visit. There is no height or weight on file to calculate BMI. Patient stated that he has no issues with gait or balance; does not use any assistive devices.  Medical screening examination/treatment/procedure(s) were performed by non-physician practitioner and as supervising physician I was immediately available for consultation/collaboration. I agree with above. Lew Dawes, MD

## 2019-09-24 NOTE — Patient Instructions (Signed)
[place] started vaccine booster sign up. Please call [place] Vaccine Line at [phone]. You can also call the venue where you had your initial COVID 19 vaccination. Thanks, AP

## 2019-09-24 NOTE — Assessment & Plan Note (Signed)
Losartan HCT, Amlodipine, Terazosyn

## 2019-09-24 NOTE — Assessment & Plan Note (Signed)
Due colon 2026

## 2019-09-25 LAB — LIPID PANEL
Cholesterol: 140 mg/dL (ref <200)
HDL: 31 mg/dL — ABNORMAL LOW (ref >=40)
LDL Cholesterol (Calc): 88 mg/dL (calc)
Non-HDL Cholesterol (Calc): 109 mg/dL (calc) (ref <130)
Total CHOL/HDL Ratio: 4.5 (calc) (ref <5.0)
Triglycerides: 113 mg/dL (ref <150)

## 2019-09-25 LAB — URINALYSIS
Bilirubin Urine: NEGATIVE
Glucose, UA: NEGATIVE
Hgb urine dipstick: NEGATIVE
Ketones, ur: NEGATIVE
Leukocytes,Ua: NEGATIVE
Nitrite: NEGATIVE
Protein, ur: NEGATIVE
Specific Gravity, Urine: 1.015 (ref 1.001–1.03)
pH: 6 (ref 5.0–8.0)

## 2019-09-25 LAB — PSA: PSA: <0.1 ng/mL (ref <=4.0)

## 2019-09-25 LAB — CBC WITH DIFFERENTIAL/PLATELET
Absolute Monocytes: 485 cells/uL (ref 200–950)
Basophils Absolute: 31 cells/uL (ref 0–200)
Basophils Relative: 0.6 %
Eosinophils Absolute: 92 cells/uL (ref 15–500)
Eosinophils Relative: 1.8 %
HCT: 41.3 % (ref 38.5–50.0)
Hemoglobin: 13.1 g/dL — ABNORMAL LOW (ref 13.2–17.1)
Lymphs Abs: 1637 cells/uL (ref 850–3900)
MCH: 25.8 pg — ABNORMAL LOW (ref 27.0–33.0)
MCHC: 31.7 g/dL — ABNORMAL LOW (ref 32.0–36.0)
MCV: 81.5 fL (ref 80.0–100.0)
MPV: 9.6 fL (ref 7.5–12.5)
Monocytes Relative: 9.5 %
Neutro Abs: 2856 cells/uL (ref 1500–7800)
Neutrophils Relative %: 56 %
Platelets: 223 10*3/uL (ref 140–400)
RBC: 5.07 10*6/uL (ref 4.20–5.80)
RDW: 13.1 % (ref 11.0–15.0)
Total Lymphocyte: 32.1 %
WBC: 5.1 10*3/uL (ref 3.8–10.8)

## 2019-09-25 LAB — TSH: TSH: 1.48 mIU/L (ref 0.40–4.50)

## 2019-10-26 ENCOUNTER — Ambulatory Visit: Payer: Medicare HMO | Admitting: Family Medicine

## 2019-12-10 ENCOUNTER — Other Ambulatory Visit: Payer: Self-pay | Admitting: Internal Medicine

## 2019-12-16 ENCOUNTER — Other Ambulatory Visit: Payer: Self-pay | Admitting: Internal Medicine

## 2020-01-25 ENCOUNTER — Encounter: Payer: Self-pay | Admitting: Internal Medicine

## 2020-01-25 ENCOUNTER — Ambulatory Visit (INDEPENDENT_AMBULATORY_CARE_PROVIDER_SITE_OTHER): Payer: Medicare HMO | Admitting: Internal Medicine

## 2020-01-25 ENCOUNTER — Other Ambulatory Visit: Payer: Self-pay

## 2020-01-25 DIAGNOSIS — M79641 Pain in right hand: Secondary | ICD-10-CM

## 2020-01-25 DIAGNOSIS — I10 Essential (primary) hypertension: Secondary | ICD-10-CM

## 2020-01-25 DIAGNOSIS — Z5181 Encounter for therapeutic drug level monitoring: Secondary | ICD-10-CM

## 2020-01-25 DIAGNOSIS — Z7901 Long term (current) use of anticoagulants: Secondary | ICD-10-CM

## 2020-01-25 DIAGNOSIS — M79642 Pain in left hand: Secondary | ICD-10-CM

## 2020-01-25 DIAGNOSIS — M79643 Pain in unspecified hand: Secondary | ICD-10-CM | POA: Insufficient documentation

## 2020-01-25 LAB — BASIC METABOLIC PANEL
BUN: 15 mg/dL (ref 6–23)
CO2: 26 mEq/L (ref 19–32)
Calcium: 8.9 mg/dL (ref 8.4–10.5)
Chloride: 105 mEq/L (ref 96–112)
Creatinine, Ser: 1.08 mg/dL (ref 0.40–1.50)
GFR: 66.04 mL/min (ref >60.00)
Glucose, Bld: 96 mg/dL (ref 70–99)
Potassium: 4 mEq/L (ref 3.5–5.1)
Sodium: 139 mEq/L (ref 135–145)

## 2020-01-25 LAB — SEDIMENTATION RATE: Sed Rate: 48 mm/hr — ABNORMAL HIGH (ref 0–20)

## 2020-01-25 MED ORDER — METHYLPREDNISOLONE 4 MG PO TBPK: ORAL_TABLET | ORAL | 0 refills | Status: DC

## 2020-01-25 NOTE — Assessment & Plan Note (Signed)
New L thumb MCP, R 3d finger MIP, L elbow stiffness in am - 30 min  Splint Ice Voltaren Medrol pack Labs

## 2020-01-25 NOTE — Progress Notes (Signed)
Subjective:  Patient ID: Dominic Shaffer, male    DOB: Jun 04, 1940  Age: 79 y.o. MRN: [id number]  CC: Joint Swelling   HPI DEAVIN POSEN presents for arthritis sx's - L thumb MCP, R 3d finger MIP, L elbow C/o stiffness in am - 30 min  Outpatient Medications Prior to Visit  Medication Sig Dispense Refill  . amLODipine (NORVASC) 5 MG tablet Take 1 tablet (5 mg total) by mouth daily. 90 tablet 3  . apixaban (ELIQUIS) 5 MG TABS tablet TAKE 1 TABLET (5 MG TOTAL) BY MOUTH 2 (TWO) TIMES DAILY. 180 tablet 3  . diclofenac Sodium (VOLTAREN) 1 % GEL Apply 2 g topically 4 (four) times daily. To affected joint. 150 g 11  . famotidine (PEPCID) 40 MG tablet Take 1 tablet by mouth once daily 90 tablet 1  . gabapentin (NEURONTIN) 100 MG capsule Take 2 capsules (200 mg total) by mouth at bedtime. 60 capsule 0  . HYDROcodone-acetaminophen (NORCO) 5-325 MG tablet Take 1 tablet by mouth every 6 (six) hours as needed for severe pain. 20 tablet 0  . IRON PO Take 65 mg by mouth daily.    . Multiple Vitamin (MULTIVITAMIN) tablet Take 1 tablet by mouth daily.    . pantoprazole (PROTONIX) 40 MG tablet Take 1 tablet (40 mg total) by mouth daily. 90 tablet 3  . penicillin v potassium (VEETID) 500 MG tablet Take as directed    . terazosin (HYTRIN) 10 MG capsule Take 1 capsule (10 mg total) by mouth at bedtime. 90 capsule 3  . triamcinolone (KENALOG) 0.025 % ointment APPLY TOPICALLY 3 TIMES DAILY 45 g 1   No facility-administered medications prior to visit.    ROS: Review of Systems  Constitutional: Negative for appetite change, fatigue and unexpected weight change.  HENT: Negative for congestion, nosebleeds, sneezing, sore throat and trouble swallowing.   Eyes: Negative for itching and visual disturbance.  Respiratory: Negative for cough.   Cardiovascular: Negative for chest pain, palpitations and leg swelling.  Gastrointestinal: Negative for abdominal distention, blood in stool, diarrhea and  nausea.  Genitourinary: Negative for frequency and hematuria.  Musculoskeletal: Positive for arthralgias. Negative for back pain, gait problem, joint swelling and neck pain.  Skin: Negative for rash.  Neurological: Negative for dizziness, tremors, speech difficulty and weakness.  Psychiatric/Behavioral: Negative for agitation, dysphoric mood and sleep disturbance. The patient is not nervous/anxious.     Objective:  BP (!) 148/62   Pulse 76   Temp 98.7 F (37.1 C) (Oral)   Ht 5\' 10"  (1.778 m)   Wt 206 lb (93.4 kg)   SpO2 95%   BMI 29.56 kg/m   BP Readings from Last 3 Encounters:  01/25/20 (!) 148/62  09/24/19 130/60  09/24/19 130/60    Wt Readings from Last 3 Encounters:  01/25/20 206 lb (93.4 kg)  09/24/19 210 lb (95.3 kg)  09/24/19 210 lb (95.3 kg)    Physical Exam Constitutional:      General: He is not in acute distress.    Appearance: He is well-developed.     Comments: NAD  HENT:     Mouth/Throat:     Mouth: Oropharynx is clear and moist.  Eyes:     Conjunctiva/sclera: Conjunctivae normal.     Pupils: Pupils are equal, round, and reactive to light.  Neck:     Thyroid: No thyromegaly.     Vascular: No JVD.  Cardiovascular:     Rate and Rhythm: Normal rate and regular rhythm.  Pulses: Intact distal pulses.     Heart sounds: Normal heart sounds. No murmur heard. No friction rub. No gallop.   Pulmonary:     Effort: Pulmonary effort is normal. No respiratory distress.     Breath sounds: Normal breath sounds. No wheezing or rales.  Chest:     Chest wall: No tenderness.  Abdominal:     General: Bowel sounds are normal. There is no distension.     Palpations: Abdomen is soft. There is no mass.     Tenderness: There is no abdominal tenderness. There is no guarding or rebound.  Musculoskeletal:        General: Tenderness present. No edema. Normal range of motion.     Cervical back: Normal range of motion.  Lymphadenopathy:     Cervical: No cervical  adenopathy.  Skin:    General: Skin is warm and dry.     Findings: No rash.  Neurological:     Mental Status: He is alert and oriented to person, place, and time.     Cranial Nerves: No cranial nerve deficit.     Motor: No abnormal muscle tone.     Coordination: He displays a negative Romberg sign. Coordination normal.     Gait: Gait normal.     Deep Tendon Reflexes: Reflexes are normal and symmetric.  Psychiatric:        Mood and Affect: Mood and affect normal.        Behavior: Behavior normal.        Thought Content: Thought content normal.        Judgment: Judgment normal.   thumb pain L R 3d  Lab Results  Component Value Date   WBC 5.1 09/24/2019   HGB 13.1 (L) 09/24/2019   HCT 41.3 09/24/2019   PLT 223 09/24/2019   GLUCOSE 83 09/24/2019   CHOL 140 09/24/2019   TRIG 113 09/24/2019   HDL 31 (L) 09/24/2019   LDLCALC 88 09/24/2019   ALT 12 09/24/2019   AST 12 09/24/2019   NA 138 09/24/2019   K 4.4 09/24/2019   CL 105 09/24/2019   CREATININE 1.01 09/24/2019   BUN 15 09/24/2019   CO2 28 09/24/2019   TSH 1.48 09/24/2019   PSA <0.1 09/24/2019   INR 0.99 10/25/2016    ECHOCARDIOGRAM COMPLETE  Result Date: 12/01/2018   ECHOCARDIOGRAM REPORT   Patient Name:   Dominic Shaffer [facility] Date of Exam: 12/01/2018 Medical Rec #:  [id number]            Height:       70.0 in Accession #:    [id number]           Weight:       214.0 lb Date of Birth:  10-16-1940            BSA:          2.15 m Patient Age:    36 years             BP:           122/54 mmHg Patient Gender: M                    HR:           62 bpm. Exam Location:  [place] Procedure: 2D Echo, 3D Echo, Cardiac Doppler, Color Doppler and Strain Analysis Indications:    I42 Dilated cardiomyopathy  History:        Patient has prior history  of Echocardiogram examinations, most                 recent 07/05/2010. CAD, DVT; Arrythmias:LBBB Risk                 Factors:Hypertension and Former Smoker. Edema.  Sonographer:    Jessee Avers, RDCS Referring Phys: [place]  1. Left ventricular ejection fraction, by visual estimation, is 55 to 60%. The left ventricle has normal function. There is mildly increased left ventricular hypertrophy.  2. Abnormal septal motion consistent with left bundle branch block.  3. Left ventricular diastolic parameters are consistent with Grade I diastolic dysfunction (impaired relaxation).  4. Global right ventricle has normal systolic function.The right ventricular size is normal. No increase in right ventricular wall thickness.  5. Left atrial size was normal.  6. Right atrial size was normal.  7. The mitral valve is abnormal. Trace mitral valve regurgitation.  8. The tricuspid valve is grossly normal. Tricuspid valve regurgitation is trivial.  9. The aortic valve is tricuspid. Aortic valve regurgitation is not visualized. 10. The pulmonic valve was grossly normal. Pulmonic valve regurgitation is not visualized. 11. The inferior vena cava is normal in size with greater than 50% respiratory variability, suggesting right atrial pressure of 3 mmHg. In comparison to the previous echocardiogram(s): 07/05/10 EF 45-50%. FINDINGS  Left Ventricle: Left ventricular ejection fraction, by visual estimation, is 55 to 60%. The left ventricle has normal function. There is mildly increased left ventricular hypertrophy. Abnormal (paradoxical) septal motion, consistent with left bundle branch block. Left ventricular diastolic parameters are consistent with Grade I diastolic dysfunction (impaired relaxation). Indeterminate filling pressures. Right Ventricle: The right ventricular size is normal. No increase in right ventricular wall thickness. Global RV systolic function is has normal systolic function. Left Atrium: Left atrial size was normal in size. Right Atrium: Right atrial size was normal in size Pericardium: There is no evidence of pericardial effusion. Mitral Valve: The mitral valve is abnormal. There  is mild thickening of the mitral valve leaflet(s). Trace mitral valve regurgitation. Tricuspid Valve: The tricuspid valve is grossly normal. Tricuspid valve regurgitation is trivial. Aortic Valve: The aortic valve is tricuspid. Aortic valve regurgitation is not visualized. Pulmonic Valve: The pulmonic valve was grossly normal. Pulmonic valve regurgitation is not visualized. Aorta: The aortic root and ascending aorta are structurally normal, with no evidence of dilitation. Venous: The inferior vena cava is normal in size with greater than 50% respiratory variability, suggesting right atrial pressure of 3 mmHg. IAS/Shunts: No atrial level shunt detected by color flow Doppler.  LEFT VENTRICLE PLAX 2D LVIDd:         6.20 cm  Diastology LVIDs:         4.30 cm  LV e' lateral:   6.75 cm/s LV PW:         1.20 cm  LV E/e' lateral: 10.8 LV IVS:        1.20 cm  LV e' medial:    6.23 cm/s LVOT diam:     2.40 cm  LV E/e' medial:  11.7 LV SV:         111 ml LV SV Index:   50.06    2D Longitudinal Strain LVOT Area:     4.52 cm 2D Strain GLS (A2C):   -23.2 %                         2D Strain GLS (A3C):   -  23.0 %                         2D Strain GLS (A4C):   -22.0 %                         2D Strain GLS Avg:     -22.7 %                          3D Volume EF:                         3D EF:        57 %                         LV EDV:       213 ml                         LV ESV:       92 ml                         LV SV:        121 ml RIGHT VENTRICLE RV Basal diam:  4.20 cm RV S prime:     12.00 cm/s TAPSE (M-mode): 3.1 cm LEFT ATRIUM             Index       RIGHT ATRIUM           Index LA diam:        3.70 cm 1.72 cm/m  RA Pressure: 3.00 mmHg LA Vol (A2C):   41.2 ml 19.18 ml/m RA Area:     11.40 cm LA Vol (A4C):   41.6 ml 19.36 ml/m RA Volume:   25.20 ml  11.73 ml/m LA Biplane Vol: 42.1 ml 19.60 ml/m  AORTIC VALVE LVOT Vmax:   97.50 cm/s LVOT Vmean:  62.800 cm/s LVOT VTI:    0.212 m  AORTA Ao Root diam: 3.50 cm Ao Asc diam:   3.30 cm MITRAL VALVE                        TRICUSPID VALVE                                     Estimated RAP:  3.00 mmHg  MV Decel Time: 292 msec             SHUNTS MV E velocity: 73.20 cm/s 103 cm/s  Systemic VTI:  0.21 m MV A velocity: 93.10 cm/s 70.3 cm/s Systemic Diam: 2.40 cm MV E/A ratio:  0.79       1.5  Lyman Bishop MD Electronically signed by Lyman Bishop MD Signature Date/Time: 12/01/2018/3:17:32 PM    Final    VAS US CAROTID  Result Date: 12/01/2018 Carotid Arterial Duplex Study Indications:       Follow-up; Carotid stenosis in 2013. Patient denies any                    cerebrovascular symptoms. Risk Factors:      Hypertension, past history of smoking, coronary artery  disease. Comparison Study:  In 06/2011, a carotid duplex showed RICA velocities of 90/23                    cm/sec and LICA velocities of 123XX123 cm/sec. Performing Technologist: Mariane Masters RVT  Examination Guidelines: A complete evaluation includes B-mode imaging, spectral Doppler, color Doppler, and power Doppler as needed of all accessible portions of each vessel. Bilateral testing is considered an integral part of a complete examination. Limited examinations for reoccurring indications may be performed as noted.  Right Carotid Findings: +----------+--------+--------+--------+------------------+--------+           PSV cm/sEDV cm/sStenosisPlaque DescriptionComments +----------+--------+--------+--------+------------------+--------+ CCA Prox  125     22                                         +----------+--------+--------+--------+------------------+--------+ CCA Distal72      13                                         +----------+--------+--------+--------+------------------+--------+ ICA Prox  50      13                                         +----------+--------+--------+--------+------------------+--------+ ICA Mid   90      24      Normal                    tortuous  +----------+--------+--------+--------+------------------+--------+ ICA Distal89      23                                         +----------+--------+--------+--------+------------------+--------+ ECA       59      6                                          +----------+--------+--------+--------+------------------+--------+ +----------+--------+-------+----------------+-------------------+           PSV cm/sEDV cmsDescribe        Arm Pressure (mmHG) +----------+--------+-------+----------------+-------------------+ [id number]            Multiphasic, [id number]                 +----------+--------+-------+----------------+-------------------+ +---------+--------+--+--------+-+---------+ VertebralPSV cm/s55EDV cm/s9Antegrade +---------+--------+--+--------+-+---------+  Left Carotid Findings: +----------+--------+--------+--------+------------------+--------+           PSV cm/sEDV cm/sStenosisPlaque DescriptionComments +----------+--------+--------+--------+------------------+--------+ CCA Prox  146     24                                         +----------+--------+--------+--------+------------------+--------+ CCA Distal83      20                                         +----------+--------+--------+--------+------------------+--------+ ICA Prox  60      13                                         +----------+--------+--------+--------+------------------+--------+  ICA Mid   93      27      Normal                    tortuous +----------+--------+--------+--------+------------------+--------+ ICA Distal112     34                                tortuous +----------+--------+--------+--------+------------------+--------+ ECA       76      10                                         +----------+--------+--------+--------+------------------+--------+ +----------+--------+--------+----------------+-------------------+           PSV cm/sEDV  cm/sDescribe        Arm Pressure (mmHG) +----------+--------+--------+----------------+-------------------+ Subclavian120             Multiphasic, [id number]                 +----------+--------+--------+----------------+-------------------+ +---------+--------+---+--------+--+---------+ VertebralPSV cm/s101EDV cm/s19Antegrade +---------+--------+---+--------+--+---------+  Summary: Right Carotid: There is no evidence of stenosis in the right ICA. The                extracranial vessels were near-normal with only minimal wall                thickening or plaque. Left Carotid: There is no evidence of stenosis in the left ICA. The extracranial               vessels were near-normal with only minimal wall thickening or               plaque. Vertebrals:  Bilateral vertebral arteries demonstrate antegrade flow. Subclavians: Normal flow hemodynamics were seen in bilateral subclavian              arteries. *See table(s) above for measurements and observations.  Electronically signed by Jenkins Rouge MD on 12/01/2018 at 3:31:22 PM.    Final     Assessment & Plan:    Walker Kehr, MD

## 2020-01-25 NOTE — Patient Instructions (Signed)
Ice Voltaren gel  Thumb & Wrist Stabilizer splint for BlackBerry Thumb, Trigger Finger, Pain Relief, Arthritis, Tendonitis, Sprained and Carpal Tunnel Supporting, Lightweight

## 2020-01-25 NOTE — Assessment & Plan Note (Signed)
Chronic BP ok at home Losartan HCT, Amlodipine, Terazosyn

## 2020-01-25 NOTE — Assessment & Plan Note (Signed)
On Eliquis No oral NSAIDs Medrol pack w/caution

## 2020-01-25 NOTE — Assessment & Plan Note (Signed)
No NSAIDS

## 2020-01-26 LAB — RHEUMATOID FACTOR: Rheumatoid fact SerPl-aCnc: <14 IU/mL (ref <14)

## 2020-01-27 DIAGNOSIS — H524 Presbyopia: Secondary | ICD-10-CM | POA: Diagnosis not present

## 2020-02-23 NOTE — Progress Notes (Signed)
CARDIOLOGY CONSULT NOTE       Patient ID: Dominic Shaffer MRN: [id number] DOB/AGE: 02/10/1940 80 y.o.  Primary Physician: Cassandria Anger, MD Primary Cardiologist: Roselyn Meier   HPI:  80 y.o. seen by cardiology in 2013 and again October 2020  History of LBBB, HTN, esophageal stricture and DVT on eliquis Calcium score 2013 28 involving LAD/D1 Cardiac CTA with less than 50% mid LAD stenosis Also noted marked esophageal dilatation with hiatal hernia History of spontaneous DVT in both legs about 4 years ago Been on anticoagulation since   He has plaque with no stenosis by duplex 12/01/18  Echo done 12/01/18 with EF 55-60% no significant valve disease  Non ischemic myvovue in 2012 Last echo done 07/05/10 EF 45-50%   DVT 2017 involving left femoral, popliteal and posterior tibial veins   No cardiac symptoms Working a bit doing HR for an Nurse, learning disability company based in [place]   Working 3 days a week for an [employer] doing HR    ROS All other systems reviewed and negative except as noted above  Past Medical History:  Diagnosis Date  . Asymptomatic stenosis of left carotid artery without infarction    per duplex LICA [id number] ([id number])  . At risk for sleep apnea    STOP-BANG= 4     SENT TO PCP 12-23-2013  . BPH with urinary obstruction    hyperplasia  . Colon polyps   . Congenital renal cyst, single   . Coronary artery disease    cardiologist--  dr Johnsie Cancel-- myoview 07-05-2010 poss. small inferior wall infact/  no ischemia/ ef 51%/  cardic CT score 28 and <50% LAD/D! disease  . Depression   . Diverticulitis   . ED (erectile dysfunction)    secondary arterial insuffiency  . Esophageal achalasia    s/p multiple dilatations and botox injection tx and surgical intervention 06-17-2011  [facility] Myotomy  . Hematospermia   . History of DVT of lower extremity    06/ 2012  . History of esophageal dilatation    and botox injection tx's at [facility]  . Hypertension   . Internal  hemorrhoid 2003   Dr. Henrene Pastor  . LBBB (left bundle branch block)   . LBP (low back pain)   . Nocturia   . Prostate cancer ([place])   . Wears glasses     Family History  Problem Relation Age of Onset  . Heart disease Mother 70       CAD  . Kidney disease Mother   . Cancer Father 5       bladder ca; passed in 1999  . Hypertension Other     Social History   Socioeconomic History  . Marital status: Married    Spouse name: Not on file  . Number of children: 3  . Years of education: Not on file  . Highest education level: Not on file  Occupational History  . Occupation: HR  Tobacco Use  . Smoking status: Former Smoker    Packs/day: 0.25    Years: 0.50    Pack years: 0.12    Types: Cigarettes    Quit date: 07/03/1965    Years since quitting: 54.6  . Smokeless tobacco: Never Used  Substance and Sexual Activity  . Alcohol use: No  . Drug use: No  . Sexual activity: Not on file    Comment: one pack of cigarettes would last a week  Other Topics Concern  . Not on file  Social History Narrative  Regular exercise-no   Social Determinants of Health   Financial Resource Strain: Low Risk   . Difficulty of Paying Living Expenses: Not hard at all  Food Insecurity: No Food Insecurity  . Worried About Charity fundraiser in the Last Year: Never true  . Ran Out of Food in the Last Year: Never true  Transportation Needs: No Transportation Needs  . Lack of Transportation (Medical): No  . Lack of Transportation (Non-Medical): No  Physical Activity: Sufficiently Active  . Days of Exercise per Week: 5 days  . Minutes of Exercise per Session: 30 min  Stress: No Stress Concern Present  . Feeling of Stress : Not at all  Social Connections: Not on file  Intimate Partner Violence: Not on file    Past Surgical History:  Procedure Laterality Date  . CARDIOVASCULAR STRESS TEST  07-05-2010  dr Johnsie Cancel   Adenosine nuclear study/  no significant ST segment change suggestive of ischemia/   possible small inferior wall infarct at mid and basal level/ ef 51%/  LBBB/  normal wall motion  . COLONOSCOPY  last one 2009  . INGUINAL HERNIA REPAIR Left 10/13/2012   Procedure: OPEN LEFT INGUINAL HERNIA REPAIR WITH ON Q PUMP;  Surgeon: Odis Hollingshead, MD;  Location: [facility];  Service: General;  Laterality: Left;  . INSERTION OF MESH Left 10/13/2012   Procedure: INSERTION OF MESH;  Surgeon: Odis Hollingshead, MD;  Location: [facility];  Service: General;  Laterality: Left;  . IR RADIOLOGIST EVAL & MGMT  10/11/2016  . LAPAROSCOPIC HELLER MYOTOMY  06-16-2001   w/ intraoperative upper endoscopy guidence (for achalasia)  . RADIOACTIVE SEED IMPLANT N/A 12/31/2013   Procedure: RADIOACTIVE SEED IMPLANT;  Surgeon: Malka So, MD;  Location: [facility];  Service: Urology;  Laterality: N/A;  seeds implanted 78 no seeds found in bladder  . TRANSTHORACIC ECHOCARDIOGRAM  07-05-2010   mild LVH/ septal motion consistent w/ LBBB/ ef 45-50%/  diffuse hypokinesis/  grade I diastolic dysfunction/ mild LAE/  trivial TR  . UMBILICAL HERNIA REPAIR  11-11-2005        Physical Exam: There were no vitals taken for this visit.    Affect appropriate Healthy:  appears stated age 80: normal Neck supple with no adenopathy JVP normal no bruits no thyromegaly Lungs clear with no wheezing and good diaphragmatic motion Heart:  S1/S2 no murmur, no rub, gallop or click PMI normal Abdomen: benighn, BS positve, no tenderness, no AAA no bruit.  No HSM or HJR Distal pulses intact with no bruits No edema Neuro non-focal Skin warm and dry No muscular weakness   Labs:   Lab Results  Component Value Date   WBC 5.1 09/24/2019   HGB 13.1 (L) 09/24/2019   HCT 41.3 09/24/2019   MCV 81.5 09/24/2019   PLT 223 09/24/2019   No results for input(s): NA, K, CL, CO2, BUN, CREATININE, CALCIUM, PROT, BILITOT, ALKPHOS, ALT, AST, GLUCOSE in the last 168 hours.  Invalid input(s): LABALBU No results found  for: CKTOTAL, CKMB, CKMBINDEX, TROPONINI  Lab Results  Component Value Date   CHOL 140 09/24/2019   CHOL 146 03/10/2018   CHOL 141 06/04/2014   Lab Results  Component Value Date   HDL 31 (L) 09/24/2019   HDL 31.10 (L) 03/10/2018   HDL 32.20 (L) 06/04/2014   Lab Results  Component Value Date   LDLCALC 88 09/24/2019   LDLCALC 86 03/10/2018   [place] 91 06/04/2014   Lab Results  Component Value Date   TRIG 113 09/24/2019   TRIG 146.0 03/10/2018   TRIG 89.0 06/04/2014   Lab Results  Component Value Date   CHOLHDL 4.5 09/24/2019   CHOLHDL 5 03/10/2018   CHOLHDL 4 06/04/2014   No results found for: LDLDIRECT    Radiology: No results found.  EKG: SR rate 84 LBBB 11/24/18 03/01/2020 SR LBBB no change    ASSESSMENT AND PLAN:   1. LBBB:  Chronic no evidence of high grade AV block yearly ECG 2. DCM:  EF reported at 45-50% echo 2012 but normal on TTE 12/01/18  3. Carotid:  47-42% LICA stenosis no recent carotid duplex will update 4. CAD:  <50% mid LAD stenosis on cardiac CTA 07/19/10  No chest pain observe   5. HTN:  Well controlled.  Continue current medications and low sodium Dash type diet.   6. Anticoagulation: no bleeding issues on eliquis chronic for 2 unprovoked LE DVTls about 4 years ago   Signed: Jenkins Rouge 02/23/2020, 2:08 PM

## 2020-03-01 ENCOUNTER — Ambulatory Visit: Payer: Medicare HMO | Admitting: Cardiovascular Disease

## 2020-03-01 ENCOUNTER — Encounter: Payer: Self-pay | Admitting: Cardiovascular Disease

## 2020-03-01 ENCOUNTER — Other Ambulatory Visit: Payer: Self-pay

## 2020-03-01 VITALS — BP 130/54 | HR 70 | Ht 70.0 in | Wt 201.0 lb

## 2020-03-01 DIAGNOSIS — I6523 Occlusion and stenosis of bilateral carotid arteries: Secondary | ICD-10-CM

## 2020-03-01 DIAGNOSIS — I420 Dilated cardiomyopathy: Secondary | ICD-10-CM | POA: Diagnosis not present

## 2020-03-01 DIAGNOSIS — I447 Left bundle-branch block, unspecified: Secondary | ICD-10-CM | POA: Diagnosis not present

## 2020-03-01 NOTE — Patient Instructions (Addendum)
Medication Instructions:  *If you need a refill on your cardiac medications before your next appointment, please call your pharmacy*  Lab Work: If you have labs (blood work) drawn today and your tests are completely normal, you will receive your results only by: Marland Kitchen MyChart Message (if you have MyChart) OR . A paper copy in the mail If you have any lab test that is abnormal or we need to change your treatment, we will call you to review the results.  Testing/Procedures: Your physician has requested that you have a carotid duplex. This test is an ultrasound of the carotid arteries in your neck. It looks at blood flow through these arteries that supply the brain with blood. Allow one hour for this exam. There are no restrictions or special instructions.   Follow-Up: At [facility], you and your health needs are our priority.  As part of our continuing mission to provide you with exceptional heart care, we have created designated Provider Care Teams.  These Care Teams include your primary Cardiologist (physician) and Advanced Practice Providers (APPs -  Physician Assistants and Nurse Practitioners) who all work together to provide you with the care you need, when you need it.  We recommend signing up for the patient portal called "MyChart".  Sign up information is provided on this After Visit Summary.  MyChart is used to connect with patients for Virtual Visits (Telemedicine).  Patients are able to view lab/test results, encounter notes, upcoming appointments, etc.  Non-urgent messages can be sent to your provider as well.   To learn more about what you can do with MyChart, go to [email or web address].    Your next appointment:   12 month(s)  The format for your next appointment:   In Person  Provider:   You may see Dr. Johnsie Cancel or one of the following Advanced Practice Providers on your designated Care Team:    Kathyrn Drown, NP

## 2020-03-08 ENCOUNTER — Ambulatory Visit (HOSPITAL_COMMUNITY)
Admission: RE | Admit: 2020-03-08 | Discharge: 2020-03-08 | Disposition: A | Discharge status: Home / Self Care | Payer: Medicare HMO | Source: Ambulatory Visit | Attending: Cardiovascular Disease | Admitting: Cardiovascular Disease

## 2020-03-08 ENCOUNTER — Other Ambulatory Visit: Payer: Self-pay

## 2020-03-08 DIAGNOSIS — I6523 Occlusion and stenosis of bilateral carotid arteries: Secondary | ICD-10-CM | POA: Diagnosis not present

## 2020-03-08 DIAGNOSIS — I447 Left bundle-branch block, unspecified: Secondary | ICD-10-CM | POA: Insufficient documentation

## 2020-03-08 DIAGNOSIS — I420 Dilated cardiomyopathy: Secondary | ICD-10-CM | POA: Diagnosis not present

## 2020-03-21 ENCOUNTER — Ambulatory Visit (INDEPENDENT_AMBULATORY_CARE_PROVIDER_SITE_OTHER): Payer: Medicare HMO | Admitting: Internal Medicine

## 2020-03-21 ENCOUNTER — Encounter: Payer: Self-pay | Admitting: Internal Medicine

## 2020-03-21 ENCOUNTER — Other Ambulatory Visit: Payer: Self-pay

## 2020-03-21 VITALS — BP 162/70 | HR 62 | Temp 98.4°F | Wt 201.6 lb

## 2020-03-21 DIAGNOSIS — I2510 Atherosclerotic heart disease of native coronary artery without angina pectoris: Secondary | ICD-10-CM

## 2020-03-21 DIAGNOSIS — R1030 Lower abdominal pain, unspecified: Secondary | ICD-10-CM | POA: Diagnosis not present

## 2020-03-21 DIAGNOSIS — I10 Essential (primary) hypertension: Secondary | ICD-10-CM

## 2020-03-21 DIAGNOSIS — I2583 Coronary atherosclerosis due to lipid rich plaque: Secondary | ICD-10-CM

## 2020-03-21 DIAGNOSIS — I872 Venous insufficiency (chronic) (peripheral): Secondary | ICD-10-CM | POA: Diagnosis not present

## 2020-03-21 LAB — COMPREHENSIVE METABOLIC PANEL
ALT: 7 U/L (ref 0–53)
AST: 11 U/L (ref 0–37)
Albumin: 3.4 g/dL — ABNORMAL LOW (ref 3.5–5.2)
Alkaline Phosphatase: 75 U/L (ref 39–117)
BUN: 15 mg/dL (ref 6–23)
CO2: 26 mEq/L (ref 19–32)
Calcium: 8.9 mg/dL (ref 8.4–10.5)
Chloride: 105 mEq/L (ref 96–112)
Creatinine, Ser: 0.99 mg/dL (ref 0.40–1.50)
GFR: 73.23 mL/min (ref >60.00)
Glucose, Bld: 88 mg/dL (ref 70–99)
Potassium: 4 mEq/L (ref 3.5–5.1)
Sodium: 140 mEq/L (ref 135–145)
Total Bilirubin: 0.6 mg/dL (ref 0.2–1.2)
Total Protein: 6.5 g/dL (ref 6.0–8.3)

## 2020-03-21 LAB — URINALYSIS
Bilirubin Urine: NEGATIVE
Hgb urine dipstick: NEGATIVE
Ketones, ur: NEGATIVE
Leukocytes,Ua: NEGATIVE
Nitrite: NEGATIVE
Specific Gravity, Urine: 1.02 (ref 1.000–1.030)
Total Protein, Urine: NEGATIVE
Urine Glucose: NEGATIVE
Urobilinogen, UA: 0.2 (ref 0.0–1.0)
pH: 6.5 (ref 5.0–8.0)

## 2020-03-21 LAB — CBC WITH DIFFERENTIAL/PLATELET
Basophils Absolute: 0 10*3/uL (ref 0.0–0.1)
Basophils Relative: 0.4 % (ref 0.0–3.0)
Eosinophils Absolute: 0.1 10*3/uL (ref 0.0–0.7)
Eosinophils Relative: 0.8 % (ref 0.0–5.0)
HCT: 36.9 % — ABNORMAL LOW (ref 39.0–52.0)
Hemoglobin: 12 g/dL — ABNORMAL LOW (ref 13.0–17.0)
Lymphocytes Relative: 19.3 % (ref 12.0–46.0)
Lymphs Abs: 1.8 10*3/uL (ref 0.7–4.0)
MCHC: 32.6 g/dL (ref 30.0–36.0)
MCV: 77.6 fl — ABNORMAL LOW (ref 78.0–100.0)
Monocytes Absolute: 0.9 10*3/uL (ref 0.1–1.0)
Monocytes Relative: 9.3 % (ref 3.0–12.0)
Neutro Abs: 6.7 10*3/uL (ref 1.4–7.7)
Neutrophils Relative %: 70.2 % (ref 43.0–77.0)
Platelets: 223 10*3/uL (ref 150.0–400.0)
RBC: 4.75 Mil/uL (ref 4.22–5.81)
RDW: 15 % (ref 11.5–15.5)
WBC: 9.6 10*3/uL (ref 4.0–10.5)

## 2020-03-21 LAB — SEDIMENTATION RATE: Sed Rate: 33 mm/hr — ABNORMAL HIGH (ref 0–20)

## 2020-03-21 MED ORDER — CIPROFLOXACIN HCL 500 MG PO TABS: 500.0000 mg | ORAL_TABLET | Freq: Two times a day (BID) | ORAL | 0 refills | Status: DC

## 2020-03-21 MED ORDER — TRIAMCINOLONE ACETONIDE 0.5 % EX CREA: 1.0000 "application " | TOPICAL_CREAM | Freq: Three times a day (TID) | CUTANEOUS | 1 refills | Status: DC

## 2020-03-21 NOTE — Progress Notes (Signed)
Subjective:  Patient ID: Dominic Shaffer, male    DOB: Aug 18, 1940  Age: 80 y.o. MRN: [id number]  CC: Abdominal Pain (Pt also c/o of night sweats. He states he /if its from all the medications he is taking)   HPI Dominic Shaffer presents for lower abd cramps and sweats x 2 weeks  Outpatient Medications Prior to Visit  Medication Sig Dispense Refill  . amLODipine (NORVASC) 5 MG tablet Take 1 tablet (5 mg total) by mouth daily. 90 tablet 3  . apixaban (ELIQUIS) 5 MG TABS tablet TAKE 1 TABLET (5 MG TOTAL) BY MOUTH 2 (TWO) TIMES DAILY. 180 tablet 3  . diclofenac Sodium (VOLTAREN) 1 % GEL Apply 2 g topically 4 (four) times daily. To affected joint. 150 g 11  . famotidine (PEPCID) 40 MG tablet Take 1 tablet by mouth once daily 90 tablet 1  . gabapentin (NEURONTIN) 100 MG capsule Take 2 capsules (200 mg total) by mouth at bedtime. 60 capsule 0  . IRON PO Take 65 mg by mouth daily.    . Multiple Vitamin (MULTIVITAMIN) tablet Take 1 tablet by mouth daily.    . pantoprazole (PROTONIX) 40 MG tablet Take 1 tablet (40 mg total) by mouth daily. 90 tablet 3  . penicillin v potassium (VEETID) 500 MG tablet Take as directed    . terazosin (HYTRIN) 10 MG capsule Take 1 capsule (10 mg total) by mouth at bedtime. 90 capsule 3  . methylPREDNISolone (MEDROL DOSEPAK) 4 MG TBPK tablet As directed (Patient not taking: Reported on 03/21/2020) 21 tablet 0  . triamcinolone (KENALOG) 0.025 % ointment APPLY TOPICALLY 3 TIMES DAILY (Patient not taking: Reported on 03/21/2020) 45 g 1   No facility-administered medications prior to visit.    ROS: Review of Systems  Constitutional: Positive for chills and diaphoresis. Negative for appetite change, fatigue, fever and unexpected weight change.  HENT: Negative for congestion, nosebleeds, sneezing, sore throat and trouble swallowing.   Eyes: Negative for itching and visual disturbance.  Respiratory: Negative for cough.   Cardiovascular: Negative for chest pain,  palpitations and leg swelling.  Gastrointestinal: Positive for abdominal pain. Negative for abdominal distention, blood in stool, diarrhea and nausea.  Genitourinary: Negative for frequency and hematuria.  Musculoskeletal: Negative for back pain, gait problem, joint swelling and neck pain.  Skin: Positive for rash.  Neurological: Negative for dizziness, tremors, speech difficulty and weakness.  Psychiatric/Behavioral: Negative for agitation, dysphoric mood and sleep disturbance. The patient is not nervous/anxious.     Objective:  BP (!) 162/70 (BP Location: Left Arm)   Pulse 62   Temp 98.4 F (36.9 C) (Oral)   Wt 201 lb 9.6 oz (91.4 kg)   SpO2 95%   BMI 28.93 kg/m   BP Readings from Last 3 Encounters:  03/21/20 (!) 162/70  03/01/20 (!) 130/54  01/25/20 (!) 148/62    Wt Readings from Last 3 Encounters:  03/21/20 201 lb 9.6 oz (91.4 kg)  03/01/20 201 lb (91.2 kg)  01/25/20 206 lb (93.4 kg)    Physical Exam Constitutional:      General: He is not in acute distress.    Appearance: He is well-developed. He is obese.     Comments: NAD  HENT:     Mouth/Throat:     Mouth: Oropharynx is clear and moist.  Eyes:     Conjunctiva/sclera: Conjunctivae normal.     Pupils: Pupils are equal, round, and reactive to light.  Neck:     Thyroid: No thyromegaly.  Vascular: No JVD.  Cardiovascular:     Rate and Rhythm: Normal rate and regular rhythm.     Pulses: Intact distal pulses.     Heart sounds: Normal heart sounds. No murmur heard. No friction rub. No gallop.   Pulmonary:     Effort: Pulmonary effort is normal. No respiratory distress.     Breath sounds: Normal breath sounds. No wheezing or rales.  Chest:     Chest wall: No tenderness.  Abdominal:     General: Bowel sounds are normal. There is no distension.     Palpations: Abdomen is soft. There is no mass.     Tenderness: There is no abdominal tenderness. There is no guarding or rebound.  Musculoskeletal:         General: No tenderness or edema. Normal range of motion.     Cervical back: Normal range of motion.  Lymphadenopathy:     Cervical: No cervical adenopathy.  Skin:    General: Skin is warm and dry.     Findings: No rash.  Neurological:     Mental Status: He is alert and oriented to person, place, and time.     Cranial Nerves: No cranial nerve deficit.     Motor: No abnormal muscle tone.     Coordination: He displays a negative Romberg sign. Coordination normal.     Gait: Gait normal.     Deep Tendon Reflexes: Reflexes are normal and symmetric.  Psychiatric:        Mood and Affect: Mood and affect normal.        Behavior: Behavior normal.        Thought Content: Thought content normal.        Judgment: Judgment normal.     Lab Results  Component Value Date   WBC 5.1 09/24/2019   HGB 13.1 (L) 09/24/2019   HCT 41.3 09/24/2019   PLT 223 09/24/2019   GLUCOSE 96 01/25/2020   CHOL 140 09/24/2019   TRIG 113 09/24/2019   HDL 31 (L) 09/24/2019   LDLCALC 88 09/24/2019   ALT 12 09/24/2019   AST 12 09/24/2019   NA 139 01/25/2020   K 4.0 01/25/2020   CL 105 01/25/2020   CREATININE 1.08 01/25/2020   BUN 15 01/25/2020   CO2 26 01/25/2020   TSH 1.48 09/24/2019   PSA <0.1 09/24/2019   INR 0.99 10/25/2016    VAS US CAROTID  Result Date: 03/09/2020 Carotid Arterial Duplex Study Indications:       Patient here for carotid follow-up. He denies any                    cerebrovascular symptoms. Risk Factors:      Hypertension, past history of smoking, coronary artery                    disease. Comparison Study:  Previous carotid duplex performed 12/01/18 showed RICA                    velocities of 35/59 cm/sec and LICA velocities of 74/16                    cm/sec. There was minimal plaque bilaterally. Performing Technologist: Mariane Masters RVT  Examination Guidelines: A complete evaluation includes B-mode imaging, spectral Doppler, color Doppler, and power Doppler as needed of all  accessible portions of each vessel. Bilateral testing is considered an integral part of a complete examination. Limited examinations  for reoccurring indications may be performed as noted.  Right Carotid Findings: +----------+--------+--------+--------+------------------+------------------+           PSV cm/sEDV cm/sStenosisPlaque DescriptionComments           +----------+--------+--------+--------+------------------+------------------+ CCA Prox  160     15                                                   +----------+--------+--------+--------+------------------+------------------+ CCA Distal96      16                                intimal thickening +----------+--------+--------+--------+------------------+------------------+ ICA Prox  90      11                                                   +----------+--------+--------+--------+------------------+------------------+ ICA Mid   90      23      Normal                                       +----------+--------+--------+--------+------------------+------------------+ ICA Distal102     25                                                   +----------+--------+--------+--------+------------------+------------------+ ECA       91      0                                                    +----------+--------+--------+--------+------------------+------------------+ +----------+--------+-------+----------------+-------------------+           PSV cm/sEDV cmsDescribe        Arm Pressure (mmHG) +----------+--------+-------+----------------+-------------------+ [id number]            Multiphasic, [id number]                 +----------+--------+-------+----------------+-------------------+ +---------+--------+--+--------+--+---------+ VertebralPSV cm/s60EDV cm/s11Antegrade +---------+--------+--+--------+--+---------+  Left Carotid Findings:  +----------+--------+--------+--------+------------------+------------------+           PSV cm/sEDV cm/sStenosisPlaque DescriptionComments           +----------+--------+--------+--------+------------------+------------------+ CCA Prox  149     21                                                   +----------+--------+--------+--------+------------------+------------------+ CCA Distal103     17                                intimal thickening +----------+--------+--------+--------+------------------+------------------+ ICA Prox  84      13                                                   +----------+--------+--------+--------+------------------+------------------+  ICA Mid   94      27      Normal                                       +----------+--------+--------+--------+------------------+------------------+ ICA Distal127     32                                                   +----------+--------+--------+--------+------------------+------------------+ ECA       78      8                                                    +----------+--------+--------+--------+------------------+------------------+ +----------+--------+--------+----------------+-------------------+           PSV cm/sEDV cm/sDescribe        Arm Pressure (mmHG) +----------+--------+--------+----------------+-------------------+ [id number]             Multiphasic, [id number]                 +----------+--------+--------+----------------+-------------------+ +---------+--------+--+--------+--+---------+ VertebralPSV cm/s62EDV cm/s12Antegrade +---------+--------+--+--------+--+---------+   Summary: Right Carotid: There is no evidence of stenosis in the right ICA. The                extracranial vessels were near-normal with only minimal wall                thickening or plaque. Left Carotid: There is no evidence of stenosis in the left ICA. The extracranial               vessels were  near-normal with only minimal wall thickening or               plaque. Vertebrals:  Bilateral vertebral arteries demonstrate antegrade flow. Subclavians: Normal flow hemodynamics were seen in bilateral subclavian              arteries. *See table(s) above for measurements and observations.  Electronically signed by Kathlyn Sacramento MD on 03/09/2020 at 9:48:07 AM.    Final     Assessment & Plan:    Walker Kehr, MD

## 2020-03-21 NOTE — Assessment & Plan Note (Signed)
Triamcinolone 0.5% cream as needed twice daily.

## 2020-03-21 NOTE — Assessment & Plan Note (Signed)
No angina 

## 2020-03-21 NOTE — Assessment & Plan Note (Signed)
Unclear etiology.  Will obtain lab work including urinalysis, CBC, sed rate liver tests.  Abdominal CT if not better.  Empiric Cipro 500 p.o. twice daily.

## 2020-03-21 NOTE — Assessment & Plan Note (Signed)
Elevated blood pressure today.  Monitor blood pressure at home.  Call if problems

## 2020-04-07 ENCOUNTER — Other Ambulatory Visit: Payer: Self-pay

## 2020-04-07 ENCOUNTER — Telehealth: Payer: Self-pay

## 2020-04-07 ENCOUNTER — Encounter: Payer: Self-pay | Admitting: Internal Medicine

## 2020-04-07 ENCOUNTER — Ambulatory Visit (HOSPITAL_COMMUNITY)
Admission: RE | Admit: 2020-04-07 | Discharge: 2020-04-07 | Disposition: A | Discharge status: Home / Self Care | Payer: Medicare HMO | Source: Ambulatory Visit | Attending: Internal Medicine | Admitting: Internal Medicine

## 2020-04-07 ENCOUNTER — Ambulatory Visit (INDEPENDENT_AMBULATORY_CARE_PROVIDER_SITE_OTHER): Payer: Medicare HMO | Admitting: Internal Medicine

## 2020-04-07 VITALS — BP 134/70 | HR 71 | Temp 98.0°F | Resp 18 | Ht 70.0 in | Wt 201.0 lb

## 2020-04-07 DIAGNOSIS — M79605 Pain in left leg: Secondary | ICD-10-CM

## 2020-04-07 DIAGNOSIS — I82412 Acute embolism and thrombosis of left femoral vein: Secondary | ICD-10-CM

## 2020-04-07 NOTE — Patient Instructions (Signed)
We will get an ultrasound of the legs to make sure there is not a new blood clot.   You can keep taking tylenol for pain if needed.

## 2020-04-07 NOTE — Assessment & Plan Note (Signed)
Checking [name] for DVT given that he may have missed some doses of eliquis and is having new pain. Swelling per patient is overall stable. Could be related to recent cipro. Advised tylenol for pain up to maximum dose 3000 mg daily.

## 2020-04-07 NOTE — Telephone Encounter (Signed)
Negative for acute DVT; chronic DVT noted bilaterally.

## 2020-04-07 NOTE — Assessment & Plan Note (Signed)
He does have history of DVT left leg and new recurrent pain and overall stable swelling. Has maybe missed some doses of eliquis but not often. Checking [name] to rule out new DVT.

## 2020-04-07 NOTE — Progress Notes (Signed)
   Subjective:   Patient ID: Dominic Shaffer, male    DOB: 11/25/1940, 80 y.o.   MRN: [id number]  HPI The patient is a 80 YO man coming in for left leg pain. Started a week ago. He was walking down stairs and missed a step. It did hurt immediately. No other injury. Prior DVT left leg and is on eliquis. Denies missing doses of this often but does forget sometimes. Wants to talk about cipro, took 2 week course end of February into March. Is still taking and has 1 dose left. Hurting in the left calf area. Chronic swelling of the legs and usually worse in the left. Wears compression stockings.   Review of Systems  Constitutional: Negative.   HENT: Negative.   Eyes: Negative.   Respiratory: Negative for cough, chest tightness and shortness of breath.   Cardiovascular: Positive for leg swelling. Negative for chest pain and palpitations.  Gastrointestinal: Negative for abdominal distention, abdominal pain, constipation, diarrhea, nausea and vomiting.  Musculoskeletal: Positive for myalgias.  Skin: Negative.   Neurological: Negative.   Psychiatric/Behavioral: Negative.     Objective:  Physical Exam Constitutional:      Appearance: He is well-developed.  HENT:     Head: Normocephalic and atraumatic.  Cardiovascular:     Rate and Rhythm: Normal rate and regular rhythm.  Pulmonary:     Effort: Pulmonary effort is normal. No respiratory distress.     Breath sounds: Normal breath sounds. No wheezing or rales.  Abdominal:     General: Bowel sounds are normal. There is no distension.     Palpations: Abdomen is soft.     Tenderness: There is no abdominal tenderness. There is no rebound.  Musculoskeletal:     Cervical back: Normal range of motion.     Right lower leg: Edema present.     Left lower leg: Edema present.     Comments: Bilateral compression stocking, left more than right, left calf tenderness  Skin:    General: Skin is warm and dry.  Neurological:     Mental Status: He is  alert and oriented to person, place, and time.     Coordination: Coordination normal.     Vitals:   04/07/20 0755  BP: 134/70  Pulse: 71  Resp: 18  Temp: 98 F (36.7 C)  TempSrc: Oral  SpO2: 97%  Weight: 201 lb (91.2 kg)  Height: 5\' 10"  (1.778 m)    This visit occurred during the SARS-CoV-2 public health emergency.  Safety protocols were in place, including screening questions prior to the visit, additional usage of staff PPE, and extensive cleaning of exam room while observing appropriate contact time as indicated for disinfecting solutions.   Assessment & Plan:  Visit time 20 minutes in face to face communication with patient and coordination of care, additional 10 minutes spent in record review, coordination or care, ordering tests, communicating/referring to other healthcare professionals, documenting in medical records all on the same day of the visit for total time 30 minutes spent on the visit.

## 2020-04-08 NOTE — Telephone Encounter (Signed)
Pt notified of the [place] results & verb understanding.  Pt advised to continue Eliquis, wear compression stockings, take walks, elevate his legs if he is going to be sitting for some time, & use warm compresses for the pain in his leg & continue taking Tylenol as needed.  Pt states he will call the office to schedule ROV as per PCP instruction during last OV.  Pt is requesting a work note for 1-2 weeks to deal with his leg pain.  Pt advised that PCP is out of office & will ask Dr Sharlet Salina since he saw her yesterday.

## 2020-04-08 NOTE — Telephone Encounter (Signed)
Work note sent to pt via Pharmacist, community.  Pt aware & is appreciative.

## 2020-04-08 NOTE — Telephone Encounter (Signed)
Continue with Eliquis as before.  Keep ROV appointment.  Thanks

## 2020-04-08 NOTE — Telephone Encounter (Signed)
Fine for work note. In future result notes for studies ordered should go to provider that ordered as I already addressed this result via result note so we don't cause duplicate work.

## 2020-09-28 ENCOUNTER — Other Ambulatory Visit: Payer: Self-pay

## 2020-09-28 ENCOUNTER — Ambulatory Visit (INDEPENDENT_AMBULATORY_CARE_PROVIDER_SITE_OTHER): Payer: Medicare HMO | Admitting: Internal Medicine

## 2020-09-28 ENCOUNTER — Encounter: Payer: Self-pay | Admitting: Internal Medicine

## 2020-09-28 VITALS — BP 132/70 | HR 54 | Temp 98.5°F | Ht 70.0 in | Wt 196.3 lb

## 2020-09-28 DIAGNOSIS — L219 Seborrheic dermatitis, unspecified: Secondary | ICD-10-CM | POA: Diagnosis not present

## 2020-09-28 DIAGNOSIS — I10 Essential (primary) hypertension: Secondary | ICD-10-CM | POA: Diagnosis not present

## 2020-09-28 DIAGNOSIS — Z0000 Encounter for general adult medical examination without abnormal findings: Secondary | ICD-10-CM

## 2020-09-28 MED ORDER — HYDROCORTISONE 2.5 % EX CREA: TOPICAL_CREAM | Freq: Two times a day (BID) | CUTANEOUS | 1 refills | Status: DC

## 2020-09-28 NOTE — Progress Notes (Signed)
Subjective:  Patient ID: Dominic Shaffer, male    DOB: [birth year]  Age: 80 y.o. MRN: [id number]  CC: Rash (Pt states he had a rash around his left eye, but it has went away now)   HPI Dominic Shaffer presents for rash around L eye - almost resolved w/OTC HCT cream  Outpatient Medications Prior to Visit  Medication Sig Dispense Refill   amLODipine (NORVASC) 5 MG tablet Take 1 tablet (5 mg total) by mouth daily. 90 tablet 3   apixaban (ELIQUIS) 5 MG TABS tablet TAKE 1 TABLET (5 MG TOTAL) BY MOUTH 2 (TWO) TIMES DAILY. 180 tablet 3   diclofenac Sodium (VOLTAREN) 1 % GEL Apply 2 g topically 4 (four) times daily. To affected joint. 150 g 11   famotidine (PEPCID) 40 MG tablet Take 1 tablet by mouth once daily 90 tablet 1   gabapentin (NEURONTIN) 100 MG capsule Take 2 capsules (200 mg total) by mouth at bedtime. 60 capsule 0   IRON PO Take 65 mg by mouth daily.     Multiple Vitamin (MULTIVITAMIN) tablet Take 1 tablet by mouth daily.     pantoprazole (PROTONIX) 40 MG tablet Take 1 tablet (40 mg total) by mouth daily. 90 tablet 3   penicillin v potassium (VEETID) 500 MG tablet Take as directed     terazosin (HYTRIN) 10 MG capsule Take 1 capsule (10 mg total) by mouth at bedtime. 90 capsule 3   triamcinolone cream (KENALOG) 0.5 % Apply 1 application topically 3 (three) times daily. 45 g 1   ciprofloxacin (CIPRO) 500 MG tablet Take 1 tablet (500 mg total) by mouth 2 (two) times daily. (Patient not taking: Reported on 09/28/2020) 28 tablet 0   No facility-administered medications prior to visit.    ROS: Review of Systems  Constitutional:  Negative for appetite change and fever.  HENT:  Negative for congestion and trouble swallowing.   Eyes:  Negative for photophobia, pain, discharge, redness, itching and visual disturbance.  Musculoskeletal:  Negative for joint swelling.  Skin:  Positive for rash. Negative for color change.  Neurological:  Negative for dizziness and tremors.   Psychiatric/Behavioral:  Negative for sleep disturbance. The patient is not nervous/anxious.    Objective:  BP 132/70 (BP Location: Left Arm)   Pulse (!) 54   Temp 98.5 F (36.9 C) (Oral)   Ht '5\' 10"'$  (1.778 m)   Wt 196 lb 4.8 oz (89 kg)   SpO2 97%   BMI 28.17 kg/m   BP Readings from Last 3 Encounters:  09/28/20 132/70  04/07/20 134/70  03/21/20 (!) 162/70    Wt Readings from Last 3 Encounters:  09/28/20 196 lb 4.8 oz (89 kg)  04/07/20 201 lb (91.2 kg)  03/21/20 201 lb 9.6 oz (91.4 kg)    Physical Exam Constitutional:      Appearance: He is obese.  Eyes:     General: No scleral icterus.       Right eye: No discharge.        Left eye: No discharge.     Extraocular Movements: Extraocular movements intact.     Conjunctiva/sclera: Conjunctivae normal.     Pupils: Pupils are equal, round, and reactive to light.  Musculoskeletal:        General: No swelling or tenderness.  Skin:    Findings: Rash present.  Neurological:     Mental Status: He is oriented to person, place, and time.  Psychiatric:        Mood  and Affect: Mood normal.  Scaly skin - eyebrows, L eyelids - almost clear  Lab Results  Component Value Date   WBC 9.6 03/21/2020   HGB 12.0 (L) 03/21/2020   HCT 36.9 (L) 03/21/2020   PLT 223.0 03/21/2020   GLUCOSE 88 03/21/2020   CHOL 140 09/24/2019   TRIG 113 09/24/2019   HDL 31 (L) 09/24/2019   LDLCALC 88 09/24/2019   ALT 7 03/21/2020   AST 11 03/21/2020   NA 140 03/21/2020   K 4.0 03/21/2020   CL 105 03/21/2020   CREATININE 0.99 03/21/2020   BUN 15 03/21/2020   CO2 26 03/21/2020   TSH 1.48 09/24/2019   PSA <0.1 09/24/2019   INR 0.99 10/25/2016    VAS [name] LOWER EXTREMITY VENOUS (DVT)  Result Date: 04/07/2020  Lower Venous DVT Study Indications: Edema, and Pain.  Risk Factors: DVT Hx bilateral DVT. Limitations: Patient tenderness to venous compressions. Comparison Study: Outside facility: 07/28/15 DVT in the left leg Performing Technologist: June Leap RDMS, RVT  Examination Guidelines: A complete evaluation includes B-mode imaging, spectral Doppler, color Doppler, and power Doppler as needed of all accessible portions of each vessel. Bilateral testing is considered an integral part of a complete examination. Limited examinations for reoccurring indications may be performed as noted. The reflux portion of the exam is performed with the patient in reverse Trendelenburg.  +---------+---------------+---------+-----------+----------+--------------+ RIGHT    CompressibilityPhasicitySpontaneityPropertiesThrombus Aging +---------+---------------+---------+-----------+----------+--------------+ CFV      Full           Yes      Yes                                 +---------+---------------+---------+-----------+----------+--------------+ SFJ      Full                                                        +---------+---------------+---------+-----------+----------+--------------+ FV Prox  Full                                                        +---------+---------------+---------+-----------+----------+--------------+ FV Mid   Partial                                      Chronic        +---------+---------------+---------+-----------+----------+--------------+ FV DistalPartial                                      Chronic        +---------+---------------+---------+-----------+----------+--------------+ PFV      Full                                                        +---------+---------------+---------+-----------+----------+--------------+ POP      Partial  Yes      Yes                  Chronic        +---------+---------------+---------+-----------+----------+--------------+ PTV      Full                                                        +---------+---------------+---------+-----------+----------+--------------+ PERO     Full                                                         +---------+---------------+---------+-----------+----------+--------------+   +---------+---------------+---------+-----------+----------+--------------+ LEFT     CompressibilityPhasicitySpontaneityPropertiesThrombus Aging +---------+---------------+---------+-----------+----------+--------------+ CFV      Full           Yes      Yes                                 +---------+---------------+---------+-----------+----------+--------------+ SFJ      Full                                                        +---------+---------------+---------+-----------+----------+--------------+ FV Prox  Full                                                        +---------+---------------+---------+-----------+----------+--------------+ FV Mid   Full                                                        +---------+---------------+---------+-----------+----------+--------------+ FV DistalPartial                                      Chronic        +---------+---------------+---------+-----------+----------+--------------+ PFV      Full                                                        +---------+---------------+---------+-----------+----------+--------------+ POP      Partial        Yes      Yes                  Chronic        +---------+---------------+---------+-----------+----------+--------------+ PTV      Full                                                        +---------+---------------+---------+-----------+----------+--------------+  PERO     Full                                                        +---------+---------------+---------+-----------+----------+--------------+    Findings reported to Jasan at 2:07pm.  Summary: RIGHT: - Findings consistent with chronic deep vein thrombosis involving the right femoral vein, and right popliteal vein. - A cystic structure is found in the popliteal fossa.  LEFT: - Findings consistent with chronic  deep vein thrombosis involving the left femoral vein, and left popliteal vein. - No cystic structure found in the popliteal fossa.  *See table(s) above for measurements and observations. Electronically signed by Ruta Hinds MD on 04/07/2020 at 3:58:58 PM.    Final     Assessment & Plan:     Walker Kehr, MD

## 2020-09-28 NOTE — Assessment & Plan Note (Signed)
New Use HCT 2.5% cream bid prn

## 2020-12-19 ENCOUNTER — Other Ambulatory Visit: Payer: Self-pay | Admitting: Internal Medicine

## 2021-01-16 ENCOUNTER — Encounter: Payer: Self-pay | Admitting: Internal Medicine

## 2021-01-16 ENCOUNTER — Other Ambulatory Visit: Payer: Self-pay

## 2021-01-16 ENCOUNTER — Ambulatory Visit (INDEPENDENT_AMBULATORY_CARE_PROVIDER_SITE_OTHER): Payer: Medicare HMO | Admitting: Internal Medicine

## 2021-01-16 VITALS — BP 141/62 | HR 63 | Temp 97.6°F | Ht 70.0 in | Wt 192.0 lb

## 2021-01-16 DIAGNOSIS — F411 Generalized anxiety disorder: Secondary | ICD-10-CM

## 2021-01-16 DIAGNOSIS — I2510 Atherosclerotic heart disease of native coronary artery without angina pectoris: Secondary | ICD-10-CM

## 2021-01-16 DIAGNOSIS — I2583 Coronary atherosclerosis due to lipid rich plaque: Secondary | ICD-10-CM

## 2021-01-16 DIAGNOSIS — R1915 Other abnormal bowel sounds: Secondary | ICD-10-CM | POA: Diagnosis not present

## 2021-01-16 DIAGNOSIS — I10 Essential (primary) hypertension: Secondary | ICD-10-CM | POA: Diagnosis not present

## 2021-01-16 DIAGNOSIS — Z86010 Personal history of colonic polyps: Secondary | ICD-10-CM | POA: Diagnosis not present

## 2021-01-16 DIAGNOSIS — I82412 Acute embolism and thrombosis of left femoral vein: Secondary | ICD-10-CM | POA: Diagnosis not present

## 2021-01-16 DIAGNOSIS — C61 Malignant neoplasm of prostate: Secondary | ICD-10-CM | POA: Diagnosis not present

## 2021-01-16 DIAGNOSIS — Z0000 Encounter for general adult medical examination without abnormal findings: Secondary | ICD-10-CM

## 2021-01-16 LAB — COMPREHENSIVE METABOLIC PANEL
ALT: 8 U/L (ref 0–53)
AST: 11 U/L (ref 0–37)
Albumin: 3.4 g/dL — ABNORMAL LOW (ref 3.5–5.2)
Alkaline Phosphatase: 74 U/L (ref 39–117)
BUN: 13 mg/dL (ref 6–23)
CO2: 29 mEq/L (ref 19–32)
Calcium: 8.7 mg/dL (ref 8.4–10.5)
Chloride: 104 mEq/L (ref 96–112)
Creatinine, Ser: 0.98 mg/dL (ref 0.40–1.50)
GFR: 73.7 mL/min (ref >60.00)
Glucose, Bld: 87 mg/dL (ref 70–99)
Potassium: 4.1 mEq/L (ref 3.5–5.1)
Sodium: 140 mEq/L (ref 135–145)
Total Bilirubin: 0.4 mg/dL (ref 0.2–1.2)
Total Protein: 6.5 g/dL (ref 6.0–8.3)

## 2021-01-16 LAB — CBC WITH DIFFERENTIAL/PLATELET
Basophils Absolute: 0 10*3/uL (ref 0.0–0.1)
Basophils Relative: 0.1 % (ref 0.0–3.0)
Eosinophils Absolute: 0.1 10*3/uL (ref 0.0–0.7)
Eosinophils Relative: 1.1 % (ref 0.0–5.0)
HCT: 37 % — ABNORMAL LOW (ref 39.0–52.0)
Hemoglobin: 11.6 g/dL — ABNORMAL LOW (ref 13.0–17.0)
Lymphocytes Relative: 13.4 % (ref 12.0–46.0)
Lymphs Abs: 1.8 10*3/uL (ref 0.7–4.0)
MCHC: 31.5 g/dL (ref 30.0–36.0)
MCV: 79.2 fl (ref 78.0–100.0)
Monocytes Absolute: 0.9 10*3/uL (ref 0.1–1.0)
Monocytes Relative: 6.6 % (ref 3.0–12.0)
Neutro Abs: 10.6 10*3/uL — ABNORMAL HIGH (ref 1.4–7.7)
Neutrophils Relative %: 78.8 % — ABNORMAL HIGH (ref 43.0–77.0)
Platelets: 232 10*3/uL (ref 150.0–400.0)
RBC: 4.67 Mil/uL (ref 4.22–5.81)
RDW: 14.7 % (ref 11.5–15.5)
WBC: 13.4 10*3/uL — ABNORMAL HIGH (ref 4.0–10.5)

## 2021-01-16 LAB — URINALYSIS
Bilirubin Urine: NEGATIVE
Hgb urine dipstick: NEGATIVE
Ketones, ur: NEGATIVE
Leukocytes,Ua: NEGATIVE
Nitrite: NEGATIVE
Specific Gravity, Urine: 1.02 (ref 1.000–1.030)
Total Protein, Urine: NEGATIVE
Urine Glucose: NEGATIVE
Urobilinogen, UA: 0.2 (ref 0.0–1.0)
pH: 6.5 (ref 5.0–8.0)

## 2021-01-16 LAB — LIPID PANEL
Cholesterol: 119 mg/dL (ref 0–200)
HDL: 31.2 mg/dL — ABNORMAL LOW (ref >39.00)
LDL Cholesterol: 63 mg/dL (ref 0–99)
NonHDL: 87.58
Total CHOL/HDL Ratio: 4
Triglycerides: 121 mg/dL (ref 0.0–149.0)
VLDL: 24.2 mg/dL (ref 0.0–40.0)

## 2021-01-16 LAB — TSH: TSH: 1.03 u[IU]/mL (ref 0.35–5.50)

## 2021-01-16 LAB — PSA: PSA: 0.11 ng/mL (ref 0.10–4.00)

## 2021-01-16 NOTE — Assessment & Plan Note (Signed)
Will check PSA 

## 2021-01-16 NOTE — Assessment & Plan Note (Signed)
Due colon 2026

## 2021-01-16 NOTE — Progress Notes (Signed)
Subjective:  Patient ID: Dominic Shaffer, male    DOB: 30-Dec-1940  Age: 80 y.o. MRN: [id number]  CC: Annual Exam   HPI Dominic Shaffer presents for a well exam   C/o intestinal sounds when laying on the L side x 3-4 months  No pain   Outpatient Medications Prior to Visit  Medication Sig Dispense Refill   amLODipine (NORVASC) 5 MG tablet TAKE 1 TABLET EVERY DAY 90 tablet 2   apixaban (ELIQUIS) 5 MG TABS tablet TAKE 1 TABLET (5 MG TOTAL) BY MOUTH 2 (TWO) TIMES DAILY. 180 tablet 3   diclofenac Sodium (VOLTAREN) 1 % GEL Apply 2 g topically 4 (four) times daily. To affected joint. 150 g 11   famotidine (PEPCID) 40 MG tablet Take 1 tablet by mouth once daily 90 tablet 1   gabapentin (NEURONTIN) 100 MG capsule Take 2 capsules (200 mg total) by mouth at bedtime. 60 capsule 0   hydrocortisone 2.5 % cream Apply topically 2 (two) times daily. 40 g 1   IRON PO Take 65 mg by mouth daily.     Multiple Vitamin (MULTIVITAMIN) tablet Take 1 tablet by mouth daily.     pantoprazole (PROTONIX) 40 MG tablet Take 1 tablet (40 mg total) by mouth daily. 90 tablet 3   penicillin v potassium (VEETID) 500 MG tablet Take as directed     terazosin (HYTRIN) 10 MG capsule Take 1 capsule (10 mg total) by mouth at bedtime. 90 capsule 3   triamcinolone cream (KENALOG) 0.5 % Apply 1 application topically 3 (three) times daily. 45 g 1   No facility-administered medications prior to visit.    ROS: Review of Systems  Constitutional:  Negative for appetite change, fatigue and unexpected weight change.  HENT:  Negative for congestion, nosebleeds, sneezing, sore throat and trouble swallowing.   Eyes:  Negative for itching and visual disturbance.  Respiratory:  Negative for cough.   Cardiovascular:  Negative for chest pain, palpitations and leg swelling.  Gastrointestinal:  Negative for abdominal distention, blood in stool, diarrhea and nausea.  Genitourinary:  Negative for frequency and hematuria.   Musculoskeletal:  Negative for back pain, gait problem, joint swelling and neck pain.  Skin:  Negative for rash.  Neurological:  Negative for dizziness, tremors, speech difficulty and weakness.  Psychiatric/Behavioral:  Negative for agitation, dysphoric mood and sleep disturbance. The patient is not nervous/anxious.    Objective:  BP (!) 141/62 (BP Location: Left Arm)    Pulse 63    Temp 97.6 F (36.4 C) (Oral)    Ht 5\' 10"  (1.778 m)    Wt 192 lb (87.1 kg)    SpO2 97%    BMI 27.55 kg/m   BP Readings from Last 3 Encounters:  01/16/21 (!) 141/62  09/28/20 132/70  04/07/20 134/70    Wt Readings from Last 3 Encounters:  01/16/21 192 lb (87.1 kg)  09/28/20 196 lb 4.8 oz (89 kg)  04/07/20 201 lb (91.2 kg)    Physical Exam Constitutional:      General: He is not in acute distress.    Appearance: He is well-developed.     Comments: NAD  Eyes:     Conjunctiva/sclera: Conjunctivae normal.     Pupils: Pupils are equal, round, and reactive to light.  Neck:     Thyroid: No thyromegaly.     Vascular: No JVD.  Cardiovascular:     Rate and Rhythm: Normal rate and regular rhythm.     Heart sounds: Normal  heart sounds. No murmur heard.   No friction rub. No gallop.  Pulmonary:     Effort: Pulmonary effort is normal. No respiratory distress.     Breath sounds: Normal breath sounds. No wheezing or rales.  Chest:     Chest wall: No tenderness.  Abdominal:     General: Bowel sounds are normal. There is no distension.     Palpations: Abdomen is soft. There is no mass.     Tenderness: There is no abdominal tenderness. There is no guarding or rebound.  Musculoskeletal:        General: No tenderness. Normal range of motion.     Cervical back: Normal range of motion.  Lymphadenopathy:     Cervical: No cervical adenopathy.  Skin:    General: Skin is warm and dry.     Findings: No rash.  Neurological:     Mental Status: He is alert and oriented to person, place, and time.     Cranial  Nerves: No cranial nerve deficit.     Motor: No abnormal muscle tone.     Coordination: Coordination normal.     Gait: Gait normal.     Deep Tendon Reflexes: Reflexes are normal and symmetric.  Psychiatric:        Behavior: Behavior normal.        Thought Content: Thought content normal.        Judgment: Judgment normal.  Rectal -  per GI  Lab Results  Component Value Date   WBC 9.6 03/21/2020   HGB 12.0 (L) 03/21/2020   HCT 36.9 (L) 03/21/2020   PLT 223.0 03/21/2020   GLUCOSE 88 03/21/2020   CHOL 140 09/24/2019   TRIG 113 09/24/2019   HDL 31 (L) 09/24/2019   LDLCALC 88 09/24/2019   ALT 7 03/21/2020   AST 11 03/21/2020   NA 140 03/21/2020   K 4.0 03/21/2020   CL 105 03/21/2020   CREATININE 0.99 03/21/2020   BUN 15 03/21/2020   CO2 26 03/21/2020   TSH 1.48 09/24/2019   PSA <0.1 09/24/2019   INR 0.99 10/25/2016    VAS [name] LOWER EXTREMITY VENOUS (DVT)  Result Date: 04/07/2020  Lower Venous DVT Study Indications: Edema, and Pain.  Risk Factors: DVT Hx bilateral DVT. Limitations: Patient tenderness to venous compressions. Comparison Study: Outside facility: 07/28/15 DVT in the left leg Performing Technologist: June Leap RDMS, RVT  Examination Guidelines: A complete evaluation includes B-mode imaging, spectral Doppler, color Doppler, and power Doppler as needed of all accessible portions of each vessel. Bilateral testing is considered an integral part of a complete examination. Limited examinations for reoccurring indications may be performed as noted. The reflux portion of the exam is performed with the patient in reverse Trendelenburg.  +---------+---------------+---------+-----------+----------+--------------+  RIGHT     Compressibility Phasicity Spontaneity Properties Thrombus Aging  +---------+---------------+---------+-----------+----------+--------------+  CFV       Full            Yes       Yes                                     +---------+---------------+---------+-----------+----------+--------------+  SFJ       Full                                                             +---------+---------------+---------+-----------+----------+--------------+  FV Prox   Full                                                             +---------+---------------+---------+-----------+----------+--------------+  FV Mid    Partial                                          Chronic         +---------+---------------+---------+-----------+----------+--------------+  FV Distal Partial                                          Chronic         +---------+---------------+---------+-----------+----------+--------------+  PFV       Full                                                             +---------+---------------+---------+-----------+----------+--------------+  POP       Partial         Yes       Yes                    Chronic         +---------+---------------+---------+-----------+----------+--------------+  PTV       Full                                                             +---------+---------------+---------+-----------+----------+--------------+  PERO      Full                                                             +---------+---------------+---------+-----------+----------+--------------+   +---------+---------------+---------+-----------+----------+--------------+  LEFT      Compressibility Phasicity Spontaneity Properties Thrombus Aging  +---------+---------------+---------+-----------+----------+--------------+  CFV       Full            Yes       Yes                                    +---------+---------------+---------+-----------+----------+--------------+  SFJ       Full                                                             +---------+---------------+---------+-----------+----------+--------------+  FV Prox   Full                                                              +---------+---------------+---------+-----------+----------+--------------+  FV Mid    Full                                                             +---------+---------------+---------+-----------+----------+--------------+  FV Distal Partial                                          Chronic         +---------+---------------+---------+-----------+----------+--------------+  PFV       Full                                                             +---------+---------------+---------+-----------+----------+--------------+  POP       Partial         Yes       Yes                    Chronic         +---------+---------------+---------+-----------+----------+--------------+  PTV       Full                                                             +---------+---------------+---------+-----------+----------+--------------+  PERO      Full                                                             +---------+---------------+---------+-----------+----------+--------------+    Findings reported to Jasan at 2:07pm.  Summary: RIGHT: - Findings consistent with chronic deep vein thrombosis involving the right femoral vein, and right popliteal vein. - A cystic structure is found in the popliteal fossa.  LEFT: - Findings consistent with chronic deep vein thrombosis involving the left femoral vein, and left popliteal vein. - No cystic structure found in the popliteal fossa.  *See table(s) above for measurements and observations. Electronically signed by Ruta Hinds MD on 04/07/2020 at 3:58:58 PM.    Final     Assessment & Plan:   Problem List Items Addressed This Visit     Anxiety state   Bowel sounds abnormal    New intestinal sounds when laying on the L side x 3-4 mo.  GI ref      CAD (coronary artery disease)    F/u w/Dr Johnsie Cancel - pending On Eliquis, Norvasc Take Fish oil      DVT of deep femoral vein, left (HCC)    On Eliquis      Essential hypertension    Chronic Losartan HCT, Amlodipine,  Terazosyn      History of colonic polyps    Due colon 2026      Relevant Orders   Ambulatory  referral to Gastroenterology   Malignant neoplasm of prostate [facility])    Will check PSA      Well adult exam - Primary     We discussed age appropriate health related issues, including available/recomended screening tests and vaccinations. Labs were ordered to be later reviewed . All questions were answered. We discussed one or more of the following - seat belt use, use of sunscreen/sun exposure exercise, fall risk reduction, second hand smoke exposure, firearm use and storage, seat belt use, a need for adhering to healthy diet and exercise. Labs were ordered.  All questions were answered. Due colon now or 2026?           No orders of the defined types were placed in this encounter.     Follow-up: No follow-ups on file.  Walker Kehr, MD

## 2021-01-16 NOTE — Assessment & Plan Note (Addendum)
°  We discussed age appropriate health related issues, including available/recomended screening tests and vaccinations. Labs were ordered to be later reviewed . All questions were answered. We discussed one or more of the following - seat belt use, use of sunscreen/sun exposure exercise, fall risk reduction, second hand smoke exposure, firearm use and storage, seat belt use, a need for adhering to healthy diet and exercise. Labs were ordered.  All questions were answered. Due colon now or 2026?

## 2021-01-16 NOTE — Assessment & Plan Note (Signed)
F/u w/Dr Johnsie Cancel - pending On Eliquis, Norvasc Take Fish oil

## 2021-01-16 NOTE — Assessment & Plan Note (Signed)
Chronic Losartan HCT, Amlodipine, Terazosyn

## 2021-01-16 NOTE — Assessment & Plan Note (Signed)
New intestinal sounds when laying on the L side x 3-4 mo.  GI ref

## 2021-01-16 NOTE — Assessment & Plan Note (Signed)
On Eliquis

## 2021-01-17 ENCOUNTER — Encounter: Payer: Self-pay | Admitting: Internal Medicine

## 2021-01-24 ENCOUNTER — Encounter: Payer: Self-pay | Admitting: Internal Medicine

## 2021-02-14 ENCOUNTER — Other Ambulatory Visit: Payer: Self-pay | Admitting: Internal Medicine

## 2021-02-20 ENCOUNTER — Ambulatory Visit (INDEPENDENT_AMBULATORY_CARE_PROVIDER_SITE_OTHER): Payer: Medicare HMO

## 2021-02-20 ENCOUNTER — Other Ambulatory Visit: Payer: Self-pay

## 2021-02-20 DIAGNOSIS — Z0000 Encounter for general adult medical examination without abnormal findings: Secondary | ICD-10-CM | POA: Diagnosis not present

## 2021-02-20 NOTE — Patient Instructions (Signed)
[name] , Thank you for taking time to come for your Medicare Wellness Visit. I appreciate your ongoing commitment to your health goals. Please review the following plan we discussed and let me know if I can assist you in the future.   Screening recommendations/referrals: Colonoscopy: No longer required  Recommended yearly ophthalmology/optometry visit for glaucoma screening and checkup Recommended yearly dental visit for hygiene and checkup  Vaccinations: Influenza vaccine: due  Pneumococcal vaccine: due  Tdap vaccine: due  Shingles vaccine: completed     Advanced directives: yes   Conditions/risks identified: none   Next appointment: none   Preventive Care 28 Years and Older, Male Preventive care refers to lifestyle choices and visits with your health care provider that can promote health and wellness. What does preventive care include? A yearly physical exam. This is also called an annual well check. Dental exams once or twice a year. Routine eye exams. Ask your health care provider how often you should have your eyes checked. Personal lifestyle choices, including: Daily care of your teeth and gums. Regular physical activity. Eating a healthy diet. Avoiding tobacco and drug use. Limiting alcohol use. Practicing safe sex. Taking low doses of aspirin every day. Taking vitamin and mineral supplements as recommended by your health care provider. What happens during an annual well check? The services and screenings done by your health care provider during your annual well check will depend on your age, overall health, lifestyle risk factors, and family history of disease. Counseling  Your health care provider may ask you questions about your: Alcohol use. Tobacco use. Drug use. Emotional well-being. Home and relationship well-being. Sexual activity. Eating habits. History of falls. Memory and ability to understand (cognition). Work and work Statistician. Screening   You may have the following tests or measurements: Height, weight, and BMI. Blood pressure. Lipid and cholesterol levels. These may be checked every 5 years, or more frequently if you are over 35 years old. Skin check. Lung cancer screening. You may have this screening every year starting at age 66 if you have a 30-pack-year history of smoking and currently smoke or have quit within the past 15 years. Fecal occult blood test (FOBT) of the stool. You may have this test every year starting at age 54. Flexible sigmoidoscopy or colonoscopy. You may have a sigmoidoscopy every 5 years or a colonoscopy every 10 years starting at age 43. Prostate cancer screening. Recommendations will vary depending on your family history and other risks. Hepatitis C blood test. Hepatitis B blood test. Sexually transmitted disease (STD) testing. Diabetes screening. This is done by checking your blood sugar (glucose) after you have not eaten for a while (fasting). You may have this done every 1-3 years. Abdominal aortic aneurysm (AAA) screening. You may need this if you are a current or former smoker. Osteoporosis. You may be screened starting at age 58 if you are at high risk. Talk with your health care provider about your test results, treatment options, and if necessary, the need for more tests. Vaccines  Your health care provider may recommend certain vaccines, such as: Influenza vaccine. This is recommended every year. Tetanus, diphtheria, and acellular pertussis (Tdap, Td) vaccine. You may need a Td booster every 10 years. Zoster vaccine. You may need this after age 68. Pneumococcal 13-valent conjugate (PCV13) vaccine. One dose is recommended after age 54. Pneumococcal polysaccharide (PPSV23) vaccine. One dose is recommended after age 40. Talk to your health care provider about which screenings and vaccines you need and  how often you need them. This information is not intended to replace advice given to you by  your health care provider. Make sure you discuss any questions you have with your health care provider. Document Released: 02/11/2015 Document Revised: 10/05/2015 Document Reviewed: 11/16/2014 Elsevier Interactive Patient Education  2017 [place] Prevention in the Home Falls can cause injuries. They can happen to people of all ages. There are many things you can do to make your home safe and to help prevent falls. What can I do on the outside of my home? Regularly fix the edges of walkways and driveways and fix any cracks. Remove anything that might make you trip as you walk through a door, such as a raised step or threshold. Trim any bushes or trees on the path to your home. Use bright outdoor lighting. Clear any walking paths of anything that might make someone trip, such as rocks or tools. Regularly check to see if handrails are loose or broken. Make sure that both sides of any steps have handrails. Any raised decks and porches should have guardrails on the edges. Have any leaves, snow, or ice cleared regularly. Use sand or salt on walking paths during winter. Clean up any spills in your garage right away. This includes oil or grease spills. What can I do in the bathroom? Use night lights. Install grab bars by the toilet and in the tub and shower. Do not use towel bars as grab bars. Use non-skid mats or decals in the tub or shower. If you need to sit down in the shower, use a plastic, non-slip stool. Keep the floor dry. Clean up any water that spills on the floor as soon as it happens. Remove soap buildup in the tub or shower regularly. Attach bath mats securely with double-sided non-slip rug tape. Do not have throw rugs and other things on the floor that can make you trip. What can I do in the bedroom? Use night lights. Make sure that you have a light by your bed that is easy to reach. Do not use any sheets or blankets that are too big for your bed. They should not hang  down onto the floor. Have a firm chair that has side arms. You can use this for support while you get dressed. Do not have throw rugs and other things on the floor that can make you trip. What can I do in the kitchen? Clean up any spills right away. Avoid walking on wet floors. Keep items that you use a lot in easy-to-reach places. If you need to reach something above you, use a strong step stool that has a grab bar. Keep electrical cords out of the way. Do not use floor polish or wax that makes floors slippery. If you must use wax, use non-skid floor wax. Do not have throw rugs and other things on the floor that can make you trip. What can I do with my stairs? Do not leave any items on the stairs. Make sure that there are handrails on both sides of the stairs and use them. Fix handrails that are broken or loose. Make sure that handrails are as long as the stairways. Check any carpeting to make sure that it is firmly attached to the stairs. Fix any carpet that is loose or worn. Avoid having throw rugs at the top or bottom of the stairs. If you do have throw rugs, attach them to the floor with carpet tape. Make sure that you have  a light switch at the top of the stairs and the bottom of the stairs. If you do not have them, ask someone to add them for you. What else can I do to help prevent falls? Wear shoes that: Do not have high heels. Have rubber bottoms. Are comfortable and fit you well. Are closed at the toe. Do not wear sandals. If you use a stepladder: Make sure that it is fully opened. Do not climb a closed stepladder. Make sure that both sides of the stepladder are locked into place. Ask someone to hold it for you, if possible. Clearly mark and make sure that you can see: Any grab bars or handrails. First and last steps. Where the edge of each step is. Use tools that help you move around (mobility aids) if they are needed. These  include: Canes. Walkers. Scooters. Crutches. Turn on the lights when you go into a dark area. Replace any light bulbs as soon as they burn out. Set up your furniture so you have a clear path. Avoid moving your furniture around. If any of your floors are uneven, fix them. If there are any pets around you, be aware of where they are. Review your medicines with your doctor. Some medicines can make you feel dizzy. This can increase your chance of falling. Ask your doctor what other things that you can do to help prevent falls. This information is not intended to replace advice given to you by your health care provider. Make sure you discuss any questions you have with your health care provider. Document Released: 11/11/2008 Document Revised: 06/23/2015 Document Reviewed: 02/19/2014 Elsevier Interactive Patient Education  2017 [employer].

## 2021-02-20 NOTE — Progress Notes (Addendum)
Subjective:   Dominic Shaffer is a 81 y.o. male who presents for an Subsequent Medicare Annual Wellness Visit.  Review of Systems     Cardiac Risk Factors include: advanced age (>48men, >1 women);male gender     Objective:    Today's Vitals   There is no height or weight on file to calculate BMI.  Advanced Directives 02/20/2021 09/24/2019 09/07/2015 07/30/2015 07/30/2015 10/05/2014 01/28/2014  Does Patient Have a Medical Advance Directive? Yes Yes Yes No;Yes No Yes Yes  Type of Paramedic of [place];Living will [place];Living will [place];Living will [place];Living will - [place];Living will Living will;Mental Health Advance Directive  Does patient want to make changes to medical advance directive? - No - Patient declined No - Patient declined No - Patient declined - - No - Patient declined  Copy of [place] in Chart? No - copy requested No - copy requested No - copy requested No - copy requested - - Yes    Current Medications (verified) Outpatient Encounter Medications as of 02/20/2021  Medication Sig   amLODipine (NORVASC) 5 MG tablet TAKE 1 TABLET EVERY DAY   apixaban (ELIQUIS) 5 MG TABS tablet TAKE 1 TABLET (5 MG TOTAL) BY MOUTH 2 (TWO) TIMES DAILY.   diclofenac Sodium (VOLTAREN) 1 % GEL Apply 2 g topically 4 (four) times daily. To affected joint.   famotidine (PEPCID) 40 MG tablet Take 1 tablet by mouth once daily   gabapentin (NEURONTIN) 100 MG capsule Take 2 capsules (200 mg total) by mouth at bedtime.   hydrocortisone 2.5 % cream Apply topically 2 (two) times daily.   IRON PO Take 65 mg by mouth daily.   Multiple Vitamin (MULTIVITAMIN) tablet Take 1 tablet by mouth daily.   pantoprazole (PROTONIX) 40 MG tablet Take 1 tablet (40 mg total) by mouth daily.   penicillin v potassium (VEETID) 500 MG tablet Take as directed   terazosin (HYTRIN) 10 MG  capsule TAKE 1 CAPSULE AT BEDTIME   triamcinolone cream (KENALOG) 0.5 % Apply 1 application topically 3 (three) times daily.   No facility-administered encounter medications on file as of 02/20/2021.    Allergies (verified) Iodinated contrast media and Shellfish allergy   History: Past Medical History:  Diagnosis Date   Asymptomatic stenosis of left carotid artery without infarction    per duplex LICA 32-67% ([id number])   At risk for sleep apnea    STOP-BANG= 4     SENT TO PCP 12-23-2013   BPH with urinary obstruction    hyperplasia   Colon polyps    Congenital renal cyst, single    Coronary artery disease    cardiologist--  dr Johnsie Cancel-- myoview 07-05-2010 poss. small inferior wall infact/  no ischemia/ ef 51%/  cardic CT score 28 and <50% LAD/D! disease   Depression    Diverticulitis    ED (erectile dysfunction)    secondary arterial insuffiency   Esophageal achalasia    s/p multiple dilatations and botox injection tx and surgical intervention 06-17-2011  Heller Myotomy   Hematospermia    History of DVT of lower extremity    06/ 2012   History of esophageal dilatation    and botox injection tx's at [facility]   Hypertension    Internal hemorrhoid 2003   Dr. Henrene Pastor   LBBB (left bundle branch block)    LBP (low back pain)    Nocturia    Prostate cancer (  [place])    Wears glasses    Past Surgical History:  Procedure Laterality Date   CARDIOVASCULAR STRESS TEST  07-05-2010  dr Johnsie Cancel   Adenosine nuclear study/  no significant ST segment change suggestive of ischemia/  possible small inferior wall infarct at mid and basal level/ ef 51%/  LBBB/  normal wall motion   COLONOSCOPY  last one 2009   INGUINAL HERNIA REPAIR Left 10/13/2012   Procedure: OPEN LEFT INGUINAL HERNIA REPAIR WITH ON Q PUMP;  Surgeon: Odis Hollingshead, MD;  Location: [facility];  Service: General;  Laterality: Left;   INSERTION OF MESH Left 10/13/2012   Procedure: INSERTION OF MESH;  Surgeon: Odis Hollingshead, MD;   Location: [facility];  Service: General;  Laterality: Left;   IR RADIOLOGIST EVAL & MGMT  10/11/2016   LAPAROSCOPIC HELLER MYOTOMY  06-16-2001   w/ intraoperative upper endoscopy [place] (for achalasia)   RADIOACTIVE SEED IMPLANT N/A 12/31/2013   Procedure: RADIOACTIVE SEED IMPLANT;  Surgeon: Malka So, MD;  Location[facility];  Service: Urology;  Laterality: N/A;  seeds implanted 78 no seeds found in bladder   TRANSTHORACIC ECHOCARDIOGRAM  07-05-2010   mild LVH/ septal motion consistent w/ LBBB/ ef 45-50%/  diffuse hypokinesis/  grade I diastolic dysfunction/ mild LAE/  trivial TR   UMBILICAL HERNIA REPAIR  11-11-2005   Family History  Problem Relation Age of Onset   Heart disease Mother 86       CAD   Kidney disease Mother    Cancer Father 8       bladder ca; passed in 1999   Hypertension Other    Social History   Socioeconomic History   Marital status: Married    Spouse name: Not on file   Number of children: 3   Years of education: Not on file   Highest education level: Not on file  Occupational History   Occupation: HR  Tobacco Use   Smoking status: Former    Packs/day: 0.25    Years: 0.50    Pack years: 0.13    Types: Cigarettes    Quit date: 07/03/1965    Years since quitting: 55.6   Smokeless tobacco: Never  Substance and Sexual Activity   Alcohol use: No   Drug use: No   Sexual activity: Not on file    Comment: one pack of cigarettes would last a week  Other Topics Concern   Not on file  Social History Narrative   Regular exercise-no   Social Determinants of Health   Financial Resource Strain: Low Risk    Difficulty of Paying Living Expenses: Not hard at all  Food Insecurity: No Food Insecurity   Worried About Charity fundraiser in the Last Year: Never true   Ran Out of Food in the Last Year: Never true  Transportation Needs: No Transportation Needs   Lack of Transportation (Medical): No   Lack of Transportation (Non-Medical): No   Physical Activity: Insufficiently Active   Days of Exercise per Week: 2 days   Minutes of Exercise per Session: 50 min  Stress: No Stress Concern Present   Feeling of Stress : Not at all  Social Connections: Moderately Integrated   Frequency of Communication with Friends and Family: Twice a week   Frequency of Social Gatherings with Friends and Family: Twice a week   Attends Religious Services: More than 4 times per year   Active Member of Clubs or Organizations: No   Attends  Archivist Meetings: Never   Marital Status: Married    Tobacco Counseling Counseling given: Not Answered   Clinical Intake:  Pre-visit preparation completed: Yes  Pain : No/denies pain     Nutritional Risks: None  How often do you need to have someone help you when you read instructions, pamphlets, or other written materials from your doctor or pharmacy?: 1 - Never What is the last grade level you completed in school?: [place] Needed?: No  Information entered by :: L.Lachell Rochette,LPN   Activities of Daily Living In your present state of health, do you have any difficulty performing the following activities: 02/20/2021  Hearing? N  Vision? N  Difficulty concentrating or making decisions? N  Walking or climbing stairs? N  Dressing or bathing? N  Doing errands, shopping? N  Preparing Food and eating ? N  Using the Toilet? N  In the past six months, have you accidently leaked urine? N  Do you have problems with loss of bowel control? N  Managing your Medications? N  Managing your Finances? N  Housekeeping or managing your Housekeeping? N  Some recent data might be hidden    Patient Care Team: Plotnikov, Evie Lacks, MD as PCP - General Aletta Edouard, MD (Radiology) Irine Seal, MD (Urology) Irene Shipper, MD as Consulting Physician (Gastroenterology) Josue Hector, MD as Consulting Physician (Cardiology)  Indicate any recent Medical Services you may  have received from other than [facility] providers in the past year (date may be approximate).     Assessment:   This is a routine wellness examination for Gwyndolyn Saxon.  Hearing/Vision screen Vision Screening - Comments:: Annual eye exams wears glasses   Dietary issues and exercise activities discussed: Current Exercise Habits: Home exercise routine, Type of exercise: walking, Time (Minutes): 45, Frequency (Times/Week): 3, Weekly Exercise (Minutes/Week): 135, Intensity: Mild, Exercise limited by: None identified   Goals Addressed   None    Depression Screen PHQ 2/9 Scores 02/20/2021 02/20/2021 01/16/2021 09/24/2019 03/10/2018 02/05/2017 05/28/2014  PHQ - 2 Score 0 0 0 0 0 0 0  PHQ- 9 Score - - 0 - - - -    Fall Risk Fall Risk  02/20/2021 01/16/2021 09/24/2019 03/10/2018 02/05/2017  Falls in the past year? 0 0 0 0 No  Comment - - - - -  Number falls in past yr: 0 0 0 - -  Injury with Fall? 0 0 0 - -  Risk for fall due to : - No Fall Risks - - -  Follow up Falls evaluation completed - - Falls evaluation completed -    FALL RISK PREVENTION PERTAINING TO THE HOME:  Any stairs in or around the home? Yes  If so, are there any without handrails? No  Home free of loose throw rugs in walkways, pet beds, electrical cords, etc? Yes  Adequate lighting in your home to reduce risk of falls? Yes   ASSISTIVE DEVICES UTILIZED TO PREVENT FALLS:  Life alert? No  Use of a cane, walker or w/c? No  Grab bars in the bathroom? Yes  Shower chair or bench in shower? Yes  Elevated toilet seat or a handicapped toilet? Yes    Cognitive Function:    Normal cognitive status assessed by direct observation by this Nurse Health Advisor. No abnormalities found.      Immunizations Immunization History  Administered Date(s) Administered   Influenza-Unspecified 11/03/2015, 11/06/2017   Moderna Sars-Covid-2 Vaccination 02/10/2019, 03/10/2019   Zoster Recombinat (Shingrix)  09/24/2019    TDAP status: Due, Education  has been provided regarding the importance of this vaccine. Advised may receive this vaccine at local pharmacy or Health Dept. Aware to provide a copy of the vaccination record if obtained from local pharmacy or Health Dept. Verbalized acceptance and understanding.  Flu Vaccine status: Due, Education has been provided regarding the importance of this vaccine. Advised may receive this vaccine at local pharmacy or Health Dept. Aware to provide a copy of the vaccination record if obtained from local pharmacy or Health Dept. Verbalized acceptance and understanding.  Pneumococcal vaccine status: Due, Education has been provided regarding the importance of this vaccine. Advised may receive this vaccine at local pharmacy or Health Dept. Aware to provide a copy of the vaccination record if obtained from local pharmacy or Health Dept. Verbalized acceptance and understanding.  Covid-19 vaccine status: Completed vaccines  Qualifies for Shingles Vaccine? Yes   Zostavax completed Yes   Shingrix Completed?: Yes  Screening Tests Health Maintenance  Topic Date Due   Pneumonia Vaccine 42+ Years old (1 - PCV) Never done   Hepatitis C Screening  Never done   TETANUS/TDAP  Never done   COVID-19 Vaccine (3 - Moderna risk series) 04/07/2019   Zoster Vaccines- Shingrix (2 of 2) 11/19/2019   INFLUENZA VACCINE  08/29/2020   HPV VACCINES  Aged Out    Health Maintenance  Health Maintenance Due  Topic Date Due   Pneumonia Vaccine 78+ Years old (1 - PCV) Never done   Hepatitis C Screening  Never done   TETANUS/TDAP  Never done   COVID-19 Vaccine (3 - Moderna risk series) 04/07/2019   Zoster Vaccines- Shingrix (2 of 2) 11/19/2019   INFLUENZA VACCINE  08/29/2020    Colorectal cancer screening: No longer required.   Lung Cancer Screening: (Low Dose CT Chest recommended if Age 66-80 years, 30 pack-year currently smoking OR have quit w/in 15years.) does not qualify.   Lung Cancer Screening Referral:  n/a  Additional Screening:  Hepatitis C Screening: does not qualify;   Vision Screening: Recommended annual ophthalmology exams for early detection of glaucoma and other disorders of the eye. Is the patient up to date with their annual eye exam?  Yes  Who is the provider or what is the name of the office in which the patient attends annual eye exams? Dr.Miller  If pt is not established with a provider, would they like to be referred to a provider to establish care? No .   Dental Screening: Recommended annual dental exams for proper oral hygiene  Community Resource Referral / Chronic Care Management: CRR required this visit?  No   CCM required this visit?  No      Plan:     I have personally reviewed and noted the following in the patients chart:   Medical and social history Use of alcohol, tobacco or illicit drugs  Current medications and supplements including opioid prescriptions. Patient is not currently taking opioid prescriptions. Functional ability and status Nutritional status Physical activity Advanced directives List of other physicians Hospitalizations, surgeries, and ER visits in previous 12 months Vitals Screenings to include cognitive, depression, and falls Referrals and appointments  In addition, I have reviewed and discussed with patient certain preventive protocols, quality metrics, and best practice recommendations. A written personalized care plan for preventive services as well as general preventive health recommendations were provided to patient.     Randel Pigg, LPN   [id number]   Nurse Notes: none  Medical screening examination/treatment/procedure(s) were performed by non-physician practitioner and as supervising physician I was immediately available for consultation/collaboration.  I agree with above. Lew Dawes, MD

## 2021-02-21 DIAGNOSIS — H5203 Hypermetropia, bilateral: Secondary | ICD-10-CM | POA: Diagnosis not present

## 2021-02-21 DIAGNOSIS — H25013 Cortical age-related cataract, bilateral: Secondary | ICD-10-CM | POA: Diagnosis not present

## 2021-02-21 DIAGNOSIS — Z0100 Encounter for examination of eyes and vision without abnormal findings: Secondary | ICD-10-CM | POA: Diagnosis not present

## 2021-02-28 ENCOUNTER — Encounter: Payer: Self-pay | Admitting: Internal Medicine

## 2021-02-28 ENCOUNTER — Other Ambulatory Visit (INDEPENDENT_AMBULATORY_CARE_PROVIDER_SITE_OTHER): Payer: Medicare HMO

## 2021-02-28 ENCOUNTER — Ambulatory Visit: Payer: Medicare HMO | Admitting: Internal Medicine

## 2021-02-28 VITALS — BP 104/52 | HR 76 | Ht 70.0 in | Wt 189.0 lb

## 2021-02-28 DIAGNOSIS — R1030 Lower abdominal pain, unspecified: Secondary | ICD-10-CM | POA: Diagnosis not present

## 2021-02-28 DIAGNOSIS — K219 Gastro-esophageal reflux disease without esophagitis: Secondary | ICD-10-CM | POA: Diagnosis not present

## 2021-02-28 DIAGNOSIS — K220 Achalasia of cardia: Secondary | ICD-10-CM | POA: Diagnosis not present

## 2021-02-28 DIAGNOSIS — Z7901 Long term (current) use of anticoagulants: Secondary | ICD-10-CM

## 2021-02-28 DIAGNOSIS — R198 Other specified symptoms and signs involving the digestive system and abdomen: Secondary | ICD-10-CM

## 2021-02-28 LAB — BASIC METABOLIC PANEL
BUN: 17 mg/dL (ref 6–23)
CO2: 27 mEq/L (ref 19–32)
Calcium: 8.8 mg/dL (ref 8.4–10.5)
Chloride: 104 mEq/L (ref 96–112)
Creatinine, Ser: 0.99 mg/dL (ref 0.40–1.50)
GFR: 72.74 mL/min (ref >60.00)
Glucose, Bld: 97 mg/dL (ref 70–99)
Potassium: 3.8 mEq/L (ref 3.5–5.1)
Sodium: 137 mEq/L (ref 135–145)

## 2021-02-28 MED ORDER — DIPHENHYDRAMINE HCL 25 MG PO CAPS: ORAL_CAPSULE | ORAL | 0 refills | Status: AC

## 2021-02-28 MED ORDER — PREDNISONE 50 MG PO TABS: ORAL_TABLET | ORAL | 0 refills | Status: DC

## 2021-02-28 NOTE — Progress Notes (Signed)
HISTORY OF PRESENT ILLNESS:  Dominic Shaffer is a 81 y.o. male with multiple medical problems as listed below.  He is referred today by his primary care provider, Dr. Alain Marion, regarding evaluation of problems with lower abdominal discomfort and hyperactive bowel sounds.  Patient was last seen in this office July 29, 2014 regarding lower abdominal and pelvic discomfort.  He underwent a CT scan of the abdomen and pelvis which was negative except for a cystic lesion of the left kidney for which he saw urology.  Complete colonoscopy October 05, 2014 revealed mild sigmoid diverticulosis.  No neoplasia.  He also has a history of achalasia status post Heller myotomy.  He does take pantoprazole and famotidine for iatrogenic reflux.  He is also on chronic Eliquis therapy.  Patient tells me that he generally has a bowel movement each day.  Over the past 3 months or so he has noticed some lower abdominal cramping which occurs in the morning.  Generally about once per week.  This is immediately relieved with defecation.  Discomfort improves over the course of 30 minutes or so.  He also notices hyperactive bowel sounds at night, particularly when he lies on his left side.  He denies nausea or vomiting.  He has had weight loss.  No melena or hematochezia.  Review of blood work from January 16, 2021 shows unremarkable comprehensive metabolic panel and CBC.  Hemoglobin 11.6.  REVIEW OF SYSTEMS:  All non-GI ROS negative unless otherwise stated in the HPI except for back pain, hearing problems, heart rhythm change  Past Medical History:  Diagnosis Date   Asymptomatic stenosis of left carotid artery without infarction    per duplex LICA 98-33% ([id number])   At risk for sleep apnea    STOP-BANG= 4     SENT TO PCP 12-23-2013   BPH with urinary obstruction    hyperplasia   Colon polyps    Congenital renal cyst, single    Coronary artery disease    cardiologist--  dr Johnsie Cancel-- myoview 07-05-2010 poss. small  inferior wall infact/  no ischemia/ ef 51%/  cardic CT score 28 and <50% LAD/D! disease   Depression    Diverticulitis    ED (erectile dysfunction)    secondary arterial insuffiency   Esophageal achalasia    s/p multiple dilatations and botox injection tx and surgical intervention 06-17-2011  Heller Myotomy   Hematospermia    History of DVT of lower extremity    06/ 2012   History of esophageal dilatation    and botox injection tx's at [facility]   Hypertension    Internal hemorrhoid 2003   Dr. Henrene Pastor   LBBB (left bundle branch block)    LBP (low back pain)    Nocturia    Prostate cancer [facility])    Wears glasses     Past Surgical History:  Procedure Laterality Date   CARDIOVASCULAR STRESS TEST  07-05-2010  dr Johnsie Cancel   Adenosine nuclear study/  no significant ST segment change suggestive of ischemia/  possible small inferior wall infarct at mid and basal level/ ef 51%/  LBBB/  normal wall motion   COLONOSCOPY  last one 2009   INGUINAL HERNIA REPAIR Left 10/13/2012   Procedure: OPEN LEFT INGUINAL HERNIA REPAIR WITH ON Q PUMP;  Surgeon: Odis Hollingshead, MD;  Location: [facility];  Service: General;  Laterality: Left;   INSERTION OF MESH Left 10/13/2012   Procedure: INSERTION OF MESH;  Surgeon: Odis Hollingshead, MD;  Location: [facility];  Service: General;  Laterality: Left;   IR RADIOLOGIST EVAL & MGMT  10/11/2016   LAPAROSCOPIC HELLER MYOTOMY  06-16-2001   w/ intraoperative upper endoscopy [place] (for achalasia)   RADIOACTIVE SEED IMPLANT N/A 12/31/2013   Procedure: RADIOACTIVE SEED IMPLANT;  Surgeon: Malka So, MD;  Location: [facility];  Service: Urology;  Laterality: N/A;  seeds implanted 78 no seeds found in bladder   TRANSTHORACIC ECHOCARDIOGRAM  07-05-2010   mild LVH/ septal motion consistent w/ LBBB/ ef 45-50%/  diffuse hypokinesis/  grade I diastolic dysfunction/ mild LAE/  trivial TR   UMBILICAL HERNIA REPAIR  11-11-2005    Social History Dominic Shaffer   reports that he quit smoking about 55 years ago. His smoking use included cigarettes. He has a 0.13 pack-year smoking history. He has never used smokeless tobacco. He reports that he does not drink alcohol and does not use drugs.  family history includes Bladder Cancer in his father; Cancer (age of onset: 13) in his father; Heart disease (age of onset: 2) in his mother; Hypertension in an other family member; Kidney disease in his mother.  Allergies  Allergen Reactions   Iodinated Contrast Media Hives   Shellfish Allergy Hives    MAINLY -SHRIMP       PHYSICAL EXAMINATION: Vital signs: BP (!) 104/52    Pulse 76    Ht 5\' 10"  (1.778 m)    Wt 189 lb (85.7 kg)    SpO2 98%    BMI 27.12 kg/m   Constitutional: generally well-appearing, no acute distress Psychiatric: alert and oriented x3, cooperative Eyes: extraocular movements intact, anicteric, conjunctiva pink Mouth: Mask Neck: supple no lymphadenopathy Cardiovascular: heart regular rate and rhythm, no murmur Lungs: clear to auscultation bilaterally Abdomen: soft, nontender, nondistended, no obvious ascites, no peritoneal signs, normal bowel sounds, no organomegaly Rectal: Omitted Extremities: no clubbing or cyanosis.  1+ lower extremity edema bilaterally Skin: no lesions on visible extremities Neuro: No focal deficits.  Cranial nerves intact  ASSESSMENT:  1.  Infrequent stable abdominal cramping in the morning relieved with defecation.  Most consistent with benign colonic spasm. 2.  [place]gmi 3.  Colonoscopy 2016 with sigmoid diverticulosis.  Otherwise normal 4.  Remote history of achalasia status post Heller myotomy 5.  Iatrogenic GERD.  On PPI   PLAN:  1.  Schedule contrast-enhanced CT scan of the abdomen pelvis to evaluate lower abdominal discomfort 2.  Fiber supplementation 3.  Continue PPI 4.  Additional recommendations, if any, to be determined. 5.  Ongoing general medical care with Dr. Alain Marion Total time 45 minutes  was spent preparing to see the patient, reviewing test and x-rays, obtaining comprehensive history, performing medically appropriate physical exam, counseling the patient regarding the above listed issues, ordering advanced radiology, and documenting clinical information in the health record

## 2021-02-28 NOTE — Patient Instructions (Addendum)
If you are age 79 or older, your body mass index should be between 23-30. Your Body mass index is 27.12 kg/m. If this is out of the aforementioned range listed, please consider follow up with your Primary Care Provider.  If you are age 70 or younger, your body mass index should be between 19-25. Your Body mass index is 27.12 kg/m. If this is out of the aformentioned range listed, please consider follow up with your Primary Care Provider.   ________________________________________________________  The [facility] providers would like to encourage you to use [facility] to communicate with providers for non-urgent requests or questions.  Due to long hold times on the telephone, sending your provider a message by [facility] may be a faster and more efficient way to get a response.  Please allow 48 business hours for a response.  Please remember that this is for non-urgent requests.  _______________________________________________________   Your provider has requested that you go to the basement level for lab work before leaving today. Press "B" on the elevator. The lab is located at the first door on the left as you exit the elevator.   You have been scheduled for a CT scan of the abdomen and pelvis at [facility] ([address]---this is in the same building as Press photographer).   You are scheduled on 03/14/2021 at 10:00am. You should arrive 15 minutes prior to your appointment time for registration. Please follow the written instructions below on the day of your exam:  WARNING: IF YOU ARE ALLERGIC TO IODINE/X-RAY DYE, PLEASE NOTIFY RADIOLOGY IMMEDIATELY AT [phone]! YOU WILL BE GIVEN A 13 HOUR PREMEDICATION PREP.  1) Do not eat or drink anything after 6:00am (4 hours prior to your test) 2) You have been given 2 bottles of oral contrast to drink. The solution may taste               better if refrigerated, but do NOT add ice or any other liquid to this solution. Shake              well before drinking.    Drink 1 bottle of contrast @ 8:00am (2 hours prior to your exam)  Drink 1 bottle of contrast @ 9:00am (1 hour prior to your exam)  You may take any medications as prescribed with a small amount of water except for the following: Metformin, Glucophage, Glucovance, Avandamet, Riomet, Fortamet, Actoplus Met, Janumet, Glumetza or Metaglip. The above medications must be held the day of the exam AND 48 hours after the exam.  The purpose of you drinking the oral contrast is to aid in the visualization of your intestinal tract. The contrast solution may cause some diarrhea. Before your exam is started, you will be given a small amount of fluid to drink. Depending on your individual set of symptoms, you may also receive an intravenous injection of x-ray contrast/dye. Plan on being at [facility] for 30 minutes or long, depending on the type of exam you are having performed.  If you have any questions regarding your exam or if you need to reschedule, you may call the CT department at [phone] between the hours of 8:00 am and 5:00 pm, Monday-Friday.  ________________________________________________________ Since you are allergic to one or more components in IV contrast, we have sent a prescription for (3) 50 mg tablets of Prednisone to your pharmacy as a Pre-Medication Prep for your upcoming procedure requiring contrast.    Take (1) 50 mg tablet of prednisone 13  hours prior to your procedure at 9:pm.   Take (1) 50 mg tablet 7 hours prior to your procedure at 3:00am.    Take (1) 50 mg tablet 1 hour prior to your procedure at 9:00am.    You also need to take 50 mg of Benadryl 1 hour prior to your procedure at 9:00am.  If you have 25 mg tablets of Benadryl, which can be purchased over the counter, you may take 2 tablets.  Otherwise we can send a prescription for (1) 50 mg tablet of Benadryl to your pharmacy.

## 2021-03-14 ENCOUNTER — Ambulatory Visit (INDEPENDENT_AMBULATORY_CARE_PROVIDER_SITE_OTHER)
Admission: RE | Admit: 2021-03-14 | Discharge: 2021-03-14 | Disposition: A | Discharge status: Home / Self Care | Payer: Medicare HMO | Source: Ambulatory Visit | Attending: Internal Medicine | Admitting: Internal Medicine

## 2021-03-14 ENCOUNTER — Other Ambulatory Visit: Payer: Self-pay

## 2021-03-14 ENCOUNTER — Other Ambulatory Visit: Payer: Medicare HMO

## 2021-03-14 DIAGNOSIS — R1030 Lower abdominal pain, unspecified: Secondary | ICD-10-CM | POA: Diagnosis not present

## 2021-03-14 DIAGNOSIS — K7689 Other specified diseases of liver: Secondary | ICD-10-CM | POA: Diagnosis not present

## 2021-03-14 DIAGNOSIS — K2289 Other specified disease of esophagus: Secondary | ICD-10-CM | POA: Diagnosis not present

## 2021-03-14 DIAGNOSIS — N200 Calculus of kidney: Secondary | ICD-10-CM | POA: Diagnosis not present

## 2021-03-14 DIAGNOSIS — K4090 Unilateral inguinal hernia, without obstruction or gangrene, not specified as recurrent: Secondary | ICD-10-CM | POA: Diagnosis not present

## 2021-03-14 MED ORDER — DIPHENHYDRAMINE HCL 25 MG PO CAPS: 50.0000 mg | ORAL_CAPSULE | Freq: Once | ORAL | Status: DC

## 2021-03-14 MED ORDER — DIPHENHYDRAMINE HCL 50 MG/ML IJ SOLN: 50.0000 mg | Freq: Once | INTRAMUSCULAR | Status: DC

## 2021-03-14 MED ORDER — PREDNISONE 1 MG PO TABS: 50.0000 mg | ORAL_TABLET | Freq: Four times a day (QID) | ORAL | Status: DC

## 2021-03-14 MED ORDER — IOHEXOL 300 MG/ML  SOLN
100.0000 mL | Freq: Once | INTRAMUSCULAR | Status: AC | PRN
  Administered 2021-03-14: 100 mL via INTRAVENOUS

## 2021-03-15 ENCOUNTER — Encounter: Payer: Self-pay | Admitting: Internal Medicine

## 2021-03-15 ENCOUNTER — Other Ambulatory Visit: Payer: Self-pay | Admitting: Family Medicine

## 2021-04-10 NOTE — Progress Notes (Incomplete)
CARDIOLOGY CONSULT NOTE       Patient ID: Dominic Shaffer MRN: [id number] DOB/AGE: 04-09-1940 81 y.o.  Primary Physician: Cassandria Anger, MD Primary Cardiologist: Johnsie Cancel   HPI:  81 y.o. seen by cardiology in 2013 and again October 2020  History of LBBB, HTN, esophageal stricture and DVT on eliquis Calcium score 2013 28 involving LAD/D1 Cardiac CTA with less than 50% mid LAD stenosis Also noted marked esophageal dilatation with hiatal hernia History of spontaneous DVT in both legs about 4 years ago Been on anticoagulation since   He has plaque with no stenosis by duplex 12/01/18  Echo done 12/01/18 with EF 55-60% no significant valve disease  Non ischemic myvovue in 2012 Last echo done 07/05/10 EF 45-50% Normal carotids 03/09/20    DVT 2017 involving left femoral, popliteal and posterior tibial veins  Duplex 3/109/22 chronic changes no new DVT   No cardiac symptoms Working a bit doing HR for an Nurse, learning disability company based in [place]   Still with GI issues seen by Dr Henrene Pastor for abdominal cramping CT abdomen benign but persistent Achalasia   ***    ROS All other systems reviewed and negative except as noted above  Past Medical History:  Diagnosis Date   Asymptomatic stenosis of left carotid artery without infarction    per duplex LICA 30-07% ([id number])   At risk for sleep apnea    STOP-BANG= 4     SENT TO PCP 12-23-2013   BPH with urinary obstruction    hyperplasia   Colon polyps    Congenital renal cyst, single    Coronary artery disease    cardiologist--  dr Johnsie Cancel-- myoview 07-05-2010 poss. small inferior wall infact/  no ischemia/ ef 51%/  cardic CT score 28 and <50% LAD/D! disease   Depression    Diverticulitis    ED (erectile dysfunction)    secondary arterial insuffiency   Esophageal achalasia    s/p multiple dilatations and botox injection tx and surgical intervention 06-17-2011  Heller Myotomy   Hematospermia    History of DVT of lower extremity     06/ 2012   History of esophageal dilatation    and botox injection tx's at [facility]   Hypertension    Internal hemorrhoid 2003   Dr. Henrene Pastor   LBBB (left bundle branch block)    LBP (low back pain)    Nocturia    [place] cancer ([place])    Wears glasses     Family History  Problem Relation Age of Onset   Heart disease Mother 17       CAD   Kidney disease Mother    Cancer Father 41       bladder ca; passed in 1999   Bladder Cancer Father    Hypertension Other    Colon cancer Neg Hx    Esophageal cancer Neg Hx    Prostate cancer Neg Hx    Pancreatic cancer Neg Hx    Stomach cancer Neg Hx     Social History   Socioeconomic History   Marital status: Married    Spouse name: Not on file   Number of children: 3   Years of education: Not on file   Highest education level: Not on file  Occupational History   Occupation: HR  Tobacco Use   Smoking status: Former    Packs/day: 0.25    Years: 0.50    Pack years: 0.13    Types: Cigarettes    Quit date: 07/03/1965  Years since quitting: 55.8   Smokeless tobacco: Never  Vaping Use   Vaping Use: Never used  Substance and Sexual Activity   Alcohol use: No   Drug use: No   Sexual activity: Not on file    Comment: one pack of cigarettes would last a week  Other Topics Concern   Not on file  Social History Narrative   Regular exercise-no   Social Determinants of Health   Financial Resource Strain: Low Risk    Difficulty of Paying Living Expenses: Not hard at all  Food Insecurity: No Food Insecurity   Worried About Charity fundraiser in the Last Year: Never true   [place] in the Last Year: Never true  Transportation Needs: No Transportation Needs   Lack of Transportation (Medical): No   Lack of Transportation (Non-Medical): No  Physical Activity: Insufficiently Active   Days of Exercise per Week: 2 days   Minutes of Exercise per Session: 50 min  Stress: No Stress Concern  Present   Feeling of Stress : Not at all  Social Connections: Moderately Integrated   Frequency of Communication with Friends and Family: Twice a week   Frequency of Social Gatherings with Friends and Family: Twice a week   Attends Religious Services: More than 4 times per year   Active Member of [employer] or Organizations: No   Attends Archivist Meetings: Never   Marital Status: Married  Human resources officer Violence: Not At Risk   Fear of Current or Ex-Partner: No   Emotionally Abused: No   Physically Abused: No   Sexually Abused: No    Past Surgical History:  Procedure Laterality Date   CARDIOVASCULAR STRESS TEST  07-05-2010  dr Johnsie Cancel   Adenosine nuclear study/  no significant ST segment change suggestive of ischemia/  possible small inferior wall infarct at mid and basal level/ ef 51%/  LBBB/  normal wall motion   COLONOSCOPY  last one 2009   INGUINAL HERNIA REPAIR Left 10/13/2012   Procedure: OPEN LEFT INGUINAL HERNIA REPAIR WITH ON Q PUMP;  Surgeon: Odis Hollingshead, MD;  Location: [facility];  Service: General;  Laterality: Left;   INSERTION OF MESH Left 10/13/2012   Procedure: INSERTION OF MESH;  Surgeon: Odis Hollingshead, MD;  Location: [facility];  Service: General;  Laterality: Left;   IR RADIOLOGIST EVAL & MGMT  10/11/2016   LAPAROSCOPIC HELLER MYOTOMY  06-16-2001   w/ intraoperative upper endoscopy [place] (for achalasia)   RADIOACTIVE SEED IMPLANT N/A 12/31/2013   Procedure: RADIOACTIVE SEED IMPLANT;  Surgeon: Malka So, MD;  Location: [facility];  Service: Urology;  Laterality: N/A;  seeds implanted 78 no seeds found in bladder   TRANSTHORACIC ECHOCARDIOGRAM  07-05-2010   mild LVH/ septal motion consistent w/ LBBB/ ef 45-50%/  diffuse hypokinesis/  grade I diastolic dysfunction/ mild LAE/  trivial TR   UMBILICAL HERNIA REPAIR  11-11-2005        Physical Exam: There were no vitals taken for this visit.    Affect  appropriate Healthy:  appears stated age 81: normal Neck supple with no adenopathy JVP normal no bruits no thyromegaly Lungs clear with no wheezing and good diaphragmatic motion Heart:  S1/S2 no murmur, no rub, gallop or click PMI normal Abdomen: benighn, BS positve, no tenderness, no AAA no bruit.  No HSM or HJR Distal pulses intact with no bruits No edema Neuro non-focal Skin warm and dry No muscular weakness  Labs:   Lab Results  Component Value Date   WBC 13.4 (H) 01/16/2021   HGB 11.6 (L) 01/16/2021   HCT 37.0 (L) 01/16/2021   MCV 79.2 01/16/2021   PLT 232.0 01/16/2021   No results for input(s): NA, K, CL, CO2, BUN, CREATININE, CALCIUM, PROT, BILITOT, ALKPHOS, ALT, AST, GLUCOSE in the last 168 hours.  Invalid input(s): LABALBU No results found for: CKTOTAL, CKMB, CKMBINDEX, TROPONINI  Lab Results  Component Value Date   CHOL 119 01/16/2021   CHOL 140 09/24/2019   CHOL 146 03/10/2018   Lab Results  Component Value Date   HDL 31.20 (L) 01/16/2021   HDL 31 (L) 09/24/2019   HDL 31.10 (L) 03/10/2018   Lab Results  Component Value Date   LDLCALC 63 01/16/2021   LDLCALC 88 09/24/2019   LDLCALC 86 03/10/2018   Lab Results  Component Value Date   TRIG 121.0 01/16/2021   TRIG 113 09/24/2019   TRIG 146.0 03/10/2018   Lab Results  Component Value Date   CHOLHDL 4 01/16/2021   CHOLHDL 4.5 09/24/2019   CHOLHDL 5 03/10/2018   No results found for: LDLDIRECT    Radiology: CT Abdomen Pelvis W Contrast  Result Date: 03/15/2021 CLINICAL DATA:  Two month history of low abdominal pain. EXAM: CT ABDOMEN AND PELVIS WITH CONTRAST TECHNIQUE: Multidetector CT imaging of the abdomen and pelvis was performed using the standard protocol following bolus administration of intravenous contrast. RADIATION DOSE REDUCTION: This exam was performed according to the departmental dose-optimization program which includes automated exposure control, adjustment of the mA and/or kV  according to patient size and/or use of iterative reconstruction technique. CONTRAST:  186m OMNIPAQUE IOHEXOL 300 MG/ML  SOLN COMPARISON:  08/10/2014 FINDINGS: Lower chest: No acute abnormality. Hepatobiliary: Right lobe of liver cyst measures 1.3 cm. Small low attenuation structure within the periphery of the right hepatic lobe measures 4 mm, image 16/2. This is technically too small to reliably characterize. No suspicious liver lesions identified at this time. Gallbladder appears normal. No bile duct dilatation. Pancreas: Unremarkable. No pancreatic ductal dilatation or surrounding inflammatory changes. Spleen: Normal in size without focal abnormality. Adrenals/Urinary Tract: Normal adrenal glands. There is a cyst arising off the posterior cortex of the interpolar left kidney measuring 4.1 cm. There is a focal area of cortical scarring within the inferior pole of the left kidney, image 29/2. There are 2 stones within the inferior pole collecting system of the left kidney which measure up to 3 mm. No hydronephrosis or hydroureter identified bilaterally. Urinary bladder is unremarkable. Stomach/Bowel: The distal esophagus appears markedly dilated compatible with clinical history of achalasia, image 1/2. Stomach appears normal. The appendix is visualized and is within normal limits. Scattered small distal colonic diverticula noted without signs of acute inflammation. Vascular/Lymphatic: Aortic atherosclerosis. No aneurysm. No abdominopelvic adenopathy. Reproductive: Seed implants identified within the prostate gland. Other: No free fluid or fluid collections. There is a right inguinal hernia which contains fat and a nonobstructed loop of small bowel, image 74/2. Musculoskeletal: There is lumbar spondylosis. First degree anterolisthesis of L4 on L5 noted. IMPRESSION: 1. No acute findings within the abdomen or pelvis. 2. Right inguinal hernia contains fat and a nonobstructed loop of small bowel. 3. Markedly dilated  distal esophagus compatible with clinical history of achalasia. 4. Nonobstructing left renal calculi. 5. Aortic Atherosclerosis (ICD10-I70.0). Electronically Signed   By: TKerby MoorsM.D.   On: 03/15/2021 08:30    EKG: SR rate 84 LBBB 11/24/18 04/10/2021 SR LBBB no  change    ASSESSMENT AND PLAN:   1. LBBB:  Chronic no evidence of high grade AV block yearly ECG 2. DCM:  EF reported at 45-50% echo 2012 but normal on TTE 12/01/18  3. Carotid:  no obstructive dx by duplex 03/09/20  4. CAD:  <50% mid LAD stenosis on cardiac CTA 07/19/10  No chest pain observe   5. HTN:  Well controlled.  Continue current medications and low sodium Dash type diet.   6. Anticoagulation: no bleeding issues on eliquis chronic for 2 unprovoked LE DVTls about 5 years ago  7. Achalasia:  chronic f/u Henrene Pastor recent abdominal cramping with no acute findings on CT  Update echo for DCM  F/U in a year   Signed: Jenkins Rouge 04/10/2021, 3:43 PM

## 2021-04-21 ENCOUNTER — Ambulatory Visit: Payer: Medicare HMO | Admitting: Cardiovascular Disease

## 2021-04-21 ENCOUNTER — Other Ambulatory Visit: Payer: Self-pay

## 2021-04-21 ENCOUNTER — Encounter: Payer: Self-pay | Admitting: Cardiovascular Disease

## 2021-04-21 VITALS — BP 148/70 | HR 67 | Ht 70.0 in | Wt 192.0 lb

## 2021-04-21 DIAGNOSIS — I447 Left bundle-branch block, unspecified: Secondary | ICD-10-CM | POA: Diagnosis not present

## 2021-04-21 DIAGNOSIS — R609 Edema, unspecified: Secondary | ICD-10-CM

## 2021-04-21 DIAGNOSIS — I420 Dilated cardiomyopathy: Secondary | ICD-10-CM | POA: Diagnosis not present

## 2021-04-21 MED ORDER — FUROSEMIDE 20 MG PO TABS: 20.0000 mg | ORAL_TABLET | Freq: Every day | ORAL | 6 refills | Status: DC | PRN

## 2021-04-21 NOTE — Patient Instructions (Addendum)
Medication Instructions:  ?FUROSEMIDE 20 MG AS NEEDED FOR SWELLING  ?*If you need a refill on your cardiac medications before your next appointment, please call your pharmacy* ? ? ?Lab Work: ?NONE ?If you have labs (blood work) drawn today and your tests are completely normal, you will receive your results only by: ?MyChart Message (if you have MyChart) OR ?A paper copy in the mail ?If you have any lab test that is abnormal or we need to change your treatment, we will call you to review the results. ? ? ?Testing/Procedures: ?Your physician has requested that you have an echocardiogram. Echocardiography is a painless test that uses sound waves to create images of your heart. It provides your doctor with information about the size and shape of your heart and how well your heart?s chambers and valves are working. This procedure takes approximately one hour. There are no restrictions for this procedure.  ? ? ?Follow-Up: ?At [facility], you and your health needs are our priority.  As part of our continuing mission to provide you with exceptional heart care, we have created designated Provider Care Teams.  These Care Teams include your primary Cardiologist (physician) and Advanced Practice Providers (APPs -  Physician Assistants and Nurse Practitioners) who all work together to provide you with the care you need, when you need it. ? ?We recommend signing up for the patient portal called "MyChart".  Sign up information is provided on this After Visit Summary.  MyChart is used to connect with patients for Virtual Visits (Telemedicine).  Patients are able to view lab/test results, encounter notes, upcoming appointments, etc.  Non-urgent messages can be sent to your provider as well.   ?To learn more about what you can do with MyChart, go to [email or web address].   ? ?Your next appointment:   ?1 year(s) ? ?The format for your next appointment:   ?In Person ? ?Provider:   ?DR Johnsie Cancel   ? ? ?Other Instructions ?NONE  ?

## 2021-05-01 ENCOUNTER — Ambulatory Visit (HOSPITAL_COMMUNITY): Payer: Medicare HMO | Attending: Cardiology

## 2021-05-01 DIAGNOSIS — I420 Dilated cardiomyopathy: Secondary | ICD-10-CM | POA: Diagnosis not present

## 2021-05-01 LAB — ECHOCARDIOGRAM COMPLETE
Area-P 1/2: 2.83 cm2
S' Lateral: 3.6 cm

## 2021-05-11 ENCOUNTER — Other Ambulatory Visit: Payer: Self-pay | Admitting: Internal Medicine

## 2021-06-10 ENCOUNTER — Encounter: Payer: Self-pay | Admitting: Internal Medicine

## 2021-06-12 MED ORDER — FAMOTIDINE 40 MG PO TABS: 40.0000 mg | ORAL_TABLET | Freq: Every day | ORAL | 1 refills | Status: DC

## 2021-06-13 MED ORDER — FAMOTIDINE 40 MG PO TABS: 40.0000 mg | ORAL_TABLET | Freq: Every day | ORAL | 1 refills | Status: AC

## 2021-06-13 NOTE — Addendum Note (Signed)
Addended by: Rae Mar on: 06/13/2021 01:37 PM ? ? Modules accepted: Orders ? ?

## 2021-07-03 ENCOUNTER — Telehealth: Payer: Self-pay | Admitting: Internal Medicine

## 2021-07-03 ENCOUNTER — Encounter: Payer: Self-pay | Admitting: Internal Medicine

## 2021-07-03 DIAGNOSIS — C61 Malignant neoplasm of prostate: Secondary | ICD-10-CM

## 2021-07-03 NOTE — Telephone Encounter (Signed)
Patient needs an updated referral for his urologist - Dr. Irine Seal.  Please advise.

## 2021-07-04 NOTE — Telephone Encounter (Signed)
Ok Thx 

## 2021-07-11 DIAGNOSIS — R351 Nocturia: Secondary | ICD-10-CM | POA: Diagnosis not present

## 2021-07-11 DIAGNOSIS — Z8546 Personal history of malignant neoplasm of prostate: Secondary | ICD-10-CM | POA: Diagnosis not present

## 2021-07-11 DIAGNOSIS — N401 Enlarged prostate with lower urinary tract symptoms: Secondary | ICD-10-CM | POA: Diagnosis not present

## 2021-07-11 DIAGNOSIS — R8271 Bacteriuria: Secondary | ICD-10-CM | POA: Diagnosis not present

## 2021-07-14 ENCOUNTER — Encounter: Payer: Self-pay | Admitting: Internal Medicine

## 2021-11-20 DIAGNOSIS — N401 Enlarged prostate with lower urinary tract symptoms: Secondary | ICD-10-CM | POA: Diagnosis not present

## 2021-11-20 DIAGNOSIS — R351 Nocturia: Secondary | ICD-10-CM | POA: Diagnosis not present

## 2021-11-20 DIAGNOSIS — C61 Malignant neoplasm of prostate: Secondary | ICD-10-CM | POA: Diagnosis not present

## 2022-03-26 ENCOUNTER — Encounter: Payer: Self-pay | Admitting: Family Medicine

## 2022-03-26 ENCOUNTER — Telehealth (INDEPENDENT_AMBULATORY_CARE_PROVIDER_SITE_OTHER): Payer: Medicare HMO | Admitting: Family Medicine

## 2022-03-26 DIAGNOSIS — U071 COVID-19: Secondary | ICD-10-CM

## 2022-03-26 MED ORDER — NIRMATRELVIR/RITONAVIR (PAXLOVID)TABLET: 3.0000 | ORAL_TABLET | Freq: Two times a day (BID) | ORAL | 0 refills | Status: AC

## 2022-03-26 MED ORDER — DEXAMETHASONE 6 MG PO TABS: 6.0000 mg | ORAL_TABLET | Freq: Every day | ORAL | 0 refills | Status: AC

## 2022-03-26 NOTE — Progress Notes (Signed)
Virtual Visit via Video Note  I connected with Dominic Shaffer on 03/26/22 at  2:20 PM EST by a video enabled telemedicine application and verified that I am speaking with the correct person using two identifiers.  Patient Location: Home Provider Location: Office/Clinic  I discussed the limitations, risks, security, and privacy concerns of performing an evaluation and management service by video and the availability of in person appointments. I also discussed with the patient that there may be a patient responsible charge related to this service. The patient expressed understanding and agreed to proceed.  Subjective: PCP: Plotnikov, Evie Lacks, MD  Chief Complaint  Patient presents with   Covid Positive    S/s: runny nose, chills, cough, decreased appetite, low energy Started 2 days ago (brother positive and was visiting)     Patient complains of cough, head congestion, headache, runny nose, sore throat, and decreased appetite and chills . He denies chest congestion, sneezing, shortness of breath, and wheezing. Onset of symptoms was 2 days ago, gradually worsening since that time. He is drinking plenty of fluids. Evaluation to date: home COVID test positive. Treatment to date:  Tylenol . He does not smoke.    ROS: Per HPI  Current Outpatient Medications:    amLODipine (NORVASC) 5 MG tablet, TAKE 1 TABLET EVERY DAY, Disp: 90 tablet, Rfl: 2   apixaban (ELIQUIS) 5 MG TABS tablet, TAKE 1 TABLET TWICE DAILY, Disp: 180 tablet, Rfl: 2   diclofenac Sodium (VOLTAREN) 1 % GEL, Apply 2 g topically 4 (four) times daily. To affected joint., Disp: 150 g, Rfl: 11   diphenhydrAMINE (BENADRYL ALLERGY) 25 mg capsule, Patient to take 2 tablets one hour prior to test, Disp: 1 capsule, Rfl: 0   famotidine (PEPCID) 40 MG tablet, Take 1 tablet (40 mg total) by mouth daily., Disp: 90 tablet, Rfl: 1   furosemide (LASIX) 20 MG tablet, Take 1 tablet (20 mg total) by mouth daily as needed. FOR SWELLING,  Disp: 30 tablet, Rfl: 6   hydrocortisone 2.5 % cream, Apply topically 2 (two) times daily., Disp: 40 g, Rfl: 1   IRON PO, Take 65 mg by mouth daily., Disp: , Rfl:    Multiple Vitamin (MULTIVITAMIN) tablet, Take 1 tablet by mouth daily., Disp: , Rfl:    pantoprazole (PROTONIX) 40 MG tablet, Take 1 tablet (40 mg total) by mouth daily., Disp: 90 tablet, Rfl: 3   terazosin (HYTRIN) 10 MG capsule, TAKE 1 CAPSULE AT BEDTIME, Disp: 90 capsule, Rfl: 3  Observations/Objective: There were no vitals filed for this visit. Physical Exam Constitutional:      General: He is not in acute distress.    Appearance: Normal appearance. He is not ill-appearing or toxic-appearing.  Eyes:     General: No scleral icterus.       Right eye: No discharge.        Left eye: No discharge.     Conjunctiva/sclera: Conjunctivae normal.  Pulmonary:     Effort: Pulmonary effort is normal. No respiratory distress.  Neurological:     Mental Status: He is alert and oriented to person, place, and time.  Psychiatric:        Mood and Affect: Mood normal.        Behavior: Behavior normal.        Thought Content: Thought content normal.        Judgment: Judgment normal.    Assessment and Plan: 1. COVID-19 Education provided on COVID-19.  Discussed signs and symptoms that should  prompt him to go to the ER.  Advised a 5-day quarantine and wearing a mask up to 14 days when going out in public.  Recommended symptom management including throat lozenges, chloraseptic spray, warm salt water gargles, hot tea/honey, cough syrup (Delsym), Tylenol, Vicks, and a humidifier at night. . Medication reactions checked with the [facility] COVID-19 drug interactions: patient was advised to decrease amlodipine from 5 mg to 2.5 mg once daily, decrease Eliquis from 5 mg to 2.5 mg BID, and monitor his blood pressure while taking Paxlovid. Advised if blood pressure gets too low and he feels dizzy he needs to stop amlodipine and  terazosin. - nirmatrelvir/ritonavir (PAXLOVID) 20 x 150 MG & 10 x '100MG'$  TABS; Take 3 tablets by mouth 2 (two) times daily for 5 days. (Take nirmatrelvir 150 mg two tablets twice daily for 5 days and ritonavir 100 mg one tablet twice daily for 5 days) Patient GFR is 72  Dispense: 30 tablet; Refill: 0 - dexamethasone (DECADRON) 6 MG tablet; Take 1 tablet (6 mg total) by mouth daily for 5 days.  Dispense: 5 tablet; Refill: 0   Follow Up Instructions: Return if symptoms worsen or fail to improve.   I discussed the assessment and treatment plan with the patient. The patient was provided an opportunity to ask questions, and all were answered. The patient agreed with the plan and demonstrated an understanding of the instructions.   The patient was advised to call back or seek an in-person evaluation if the symptoms worsen or if the condition fails to improve as anticipated.  The above assessment and management plan was discussed with the patient. The patient verbalized understanding of and has agreed to the management plan.   Loman Brooklyn, FNP

## 2022-03-26 NOTE — Patient Instructions (Addendum)
Throat lozenges, chloraseptic spray, warm salt water gargles, hot tea/honey, cough syrup (Delsym), Tylenol, Vicks, and a humidifier at night.

## 2022-03-27 ENCOUNTER — Encounter: Payer: Self-pay | Admitting: Family Medicine

## 2022-03-30 ENCOUNTER — Encounter: Payer: Self-pay | Admitting: Family Medicine

## 2022-03-30 NOTE — Telephone Encounter (Signed)
fyi

## 2022-04-02 ENCOUNTER — Encounter: Payer: Self-pay | Admitting: Family Medicine

## 2022-04-02 ENCOUNTER — Encounter: Payer: Self-pay | Admitting: Internal Medicine

## 2022-05-15 ENCOUNTER — Telehealth: Payer: Self-pay | Admitting: *Deleted

## 2022-05-15 NOTE — Telephone Encounter (Signed)
Drop off Handi-cap form for MD to complete. Place in MD purple folder.Marland KitchenRaechel Shaffer

## 2022-05-16 NOTE — Telephone Encounter (Signed)
Signed. Thanks.

## 2022-05-16 NOTE — Telephone Encounter (Signed)
Notified pt handi-cap placker ready for pick-up...Dominic Shaffer

## 2022-06-09 ENCOUNTER — Encounter: Payer: Self-pay | Admitting: Internal Medicine

## 2022-06-11 MED ORDER — HYDROCORTISONE 2.5 % EX CREA: TOPICAL_CREAM | Freq: Two times a day (BID) | CUTANEOUS | 1 refills | Status: AC

## 2022-06-19 ENCOUNTER — Encounter: Payer: Self-pay | Admitting: Internal Medicine

## 2022-06-20 ENCOUNTER — Other Ambulatory Visit: Payer: Self-pay | Admitting: Internal Medicine

## 2022-07-06 ENCOUNTER — Encounter: Payer: Self-pay | Admitting: Internal Medicine

## 2022-07-06 ENCOUNTER — Ambulatory Visit (INDEPENDENT_AMBULATORY_CARE_PROVIDER_SITE_OTHER): Payer: Medicare HMO | Admitting: Internal Medicine

## 2022-07-06 VITALS — BP 148/86 | HR 64 | Temp 98.7°F | Ht 70.0 in | Wt 182.0 lb

## 2022-07-06 DIAGNOSIS — Z5181 Encounter for therapeutic drug level monitoring: Secondary | ICD-10-CM | POA: Diagnosis not present

## 2022-07-06 DIAGNOSIS — I10 Essential (primary) hypertension: Secondary | ICD-10-CM | POA: Diagnosis not present

## 2022-07-06 DIAGNOSIS — C61 Malignant neoplasm of prostate: Secondary | ICD-10-CM

## 2022-07-06 DIAGNOSIS — Z7901 Long term (current) use of anticoagulants: Secondary | ICD-10-CM | POA: Diagnosis not present

## 2022-07-06 DIAGNOSIS — I82412 Acute embolism and thrombosis of left femoral vein: Secondary | ICD-10-CM

## 2022-07-06 MED ORDER — AMLODIPINE BESYLATE 5 MG PO TABS: 5.0000 mg | ORAL_TABLET | Freq: Every day | ORAL | 0 refills | Status: DC

## 2022-07-06 MED ORDER — AMLODIPINE BESYLATE 5 MG PO TABS: 5.0000 mg | ORAL_TABLET | Freq: Every day | ORAL | 3 refills | Status: DC

## 2022-07-06 MED ORDER — TERAZOSIN HCL 10 MG PO CAPS: 10.0000 mg | ORAL_CAPSULE | Freq: Every day | ORAL | 3 refills | Status: DC

## 2022-07-06 MED ORDER — APIXABAN 5 MG PO TABS: 5.0000 mg | ORAL_TABLET | Freq: Two times a day (BID) | ORAL | 3 refills | Status: AC

## 2022-07-06 MED ORDER — PANTOPRAZOLE SODIUM 40 MG PO TBEC: 40.0000 mg | DELAYED_RELEASE_TABLET | Freq: Every day | ORAL | 3 refills | Status: DC

## 2022-07-06 NOTE — Progress Notes (Signed)
Subjective:  Patient ID: Dominic Shaffer, male    DOB: 06-23-1940  Age: 82 y.o. MRN: [id number]  CC: Annual Exam   HPI Dominic Shaffer presents for HTN, CAD, DVT x3 Pt ran out of Amlodipine  Outpatient Medications Prior to Visit  Medication Sig Dispense Refill   diclofenac Sodium (VOLTAREN) 1 % GEL Apply 2 g topically 4 (four) times daily. To affected joint. 150 g 11   diphenhydrAMINE (BENADRYL ALLERGY) 25 mg capsule Patient to take 2 tablets one hour prior to test 1 capsule 0   famotidine (PEPCID) 40 MG tablet Take 1 tablet (40 mg total) by mouth daily. 90 tablet 1   hydrocortisone 2.5 % cream Apply topically 2 (two) times daily. 40 g 1   IRON PO Take 65 mg by mouth daily.     Multiple Vitamin (MULTIVITAMIN) tablet Take 1 tablet by mouth daily.     amLODipine (NORVASC) 5 MG tablet TAKE 1 TABLET EVERY DAY 90 tablet 2   apixaban (ELIQUIS) 5 MG TABS tablet TAKE 1 TABLET TWICE DAILY 180 tablet 2   pantoprazole (PROTONIX) 40 MG tablet Take 1 tablet (40 mg total) by mouth daily. 90 tablet 3   terazosin (HYTRIN) 10 MG capsule TAKE 1 CAPSULE AT BEDTIME 90 capsule 3   furosemide (LASIX) 20 MG tablet Take 1 tablet (20 mg total) by mouth daily as needed. FOR SWELLING 30 tablet 6   No facility-administered medications prior to visit.    ROS: Review of Systems  Constitutional:  Negative for appetite change, fatigue and unexpected weight change.  HENT:  Negative for congestion, nosebleeds, sneezing, sore throat and trouble swallowing.   Eyes:  Negative for itching and visual disturbance.  Respiratory:  Negative for cough.   Cardiovascular:  Negative for chest pain, palpitations and leg swelling.  Gastrointestinal:  Negative for abdominal distention, blood in stool, diarrhea and nausea.  Genitourinary:  Negative for frequency and hematuria.  Musculoskeletal:  Negative for back pain, gait problem, joint swelling and neck pain.  Skin:  Negative for rash.  Neurological:  Negative for  dizziness, tremors, speech difficulty and weakness.  Psychiatric/Behavioral:  Negative for agitation, dysphoric mood and sleep disturbance. The patient is not nervous/anxious.     Objective:  BP (!) 148/86 (BP Location: Left Arm, Patient Position: Sitting, Cuff Size: Normal)   Pulse 64   Temp 98.7 F (37.1 C) (Oral)   Ht 5\' 10"  (1.778 m)   Wt 182 lb (82.6 kg)   SpO2 97%   BMI 26.11 kg/m   BP Readings from Last 3 Encounters:  07/06/22 (!) 148/86  04/21/21 (!) 148/70  02/28/21 (!) 104/52    Wt Readings from Last 3 Encounters:  07/06/22 182 lb (82.6 kg)  04/21/21 192 lb (87.1 kg)  02/28/21 189 lb (85.7 kg)    Physical Exam Constitutional:      Appearance: Normal appearance.     Lab Results  Component Value Date   WBC 13.4 (H) 01/16/2021   HGB 11.6 (L) 01/16/2021   HCT 37.0 (L) 01/16/2021   PLT 232.0 01/16/2021   GLUCOSE 97 02/28/2021   CHOL 119 01/16/2021   TRIG 121.0 01/16/2021   HDL 31.20 (L) 01/16/2021   LDLCALC 63 01/16/2021   ALT 8 01/16/2021   AST 11 01/16/2021   NA 137 02/28/2021   K 3.8 02/28/2021   CL 104 02/28/2021   CREATININE 0.99 02/28/2021   BUN 17 02/28/2021   CO2 27 02/28/2021   TSH 1.03 01/16/2021  PSA 0.11 01/16/2021   INR 0.99 10/25/2016    CT Abdomen Pelvis W Contrast  Result Date: 03/15/2021 CLINICAL DATA:  Two month history of low abdominal pain. EXAM: CT ABDOMEN AND PELVIS WITH CONTRAST TECHNIQUE: Multidetector CT imaging of the abdomen and pelvis was performed using the standard protocol following bolus administration of intravenous contrast. RADIATION DOSE REDUCTION: This exam was performed according to the departmental dose-optimization program which includes automated exposure control, adjustment of the mA and/or kV according to patient size and/or use of iterative reconstruction technique. CONTRAST:  OMNIPAQUE IOHEXOL 300 MG/ML  SOLN COMPARISON:  08/10/2014 FINDINGS: Lower chest: No acute abnormality. Hepatobiliary: Right lobe  of liver cyst measures 1.3 cm. Small low attenuation structure within the periphery of the right hepatic lobe measures 4 mm, image 16/2. This is technically too small to reliably characterize. No suspicious liver lesions identified at this time. Gallbladder appears normal. No bile duct dilatation. Pancreas: Unremarkable. No pancreatic ductal dilatation or surrounding inflammatory changes. Spleen: Normal in size without focal abnormality. Adrenals/Urinary Tract: Normal adrenal glands. There is a cyst arising off the posterior cortex of the interpolar left kidney measuring 4.1 cm. There is a focal area of cortical scarring within the inferior pole of the left kidney, image 29/2. There are 2 stones within the inferior pole collecting system of the left kidney which measure up to 3 mm. No hydronephrosis or hydroureter identified bilaterally. Urinary bladder is unremarkable. Stomach/Bowel: The distal esophagus appears markedly dilated compatible with clinical history of achalasia, image 1/2. Stomach appears normal. The appendix is visualized and is within normal limits. Scattered small distal colonic diverticula noted without signs of acute inflammation. Vascular/Lymphatic: Aortic atherosclerosis. No aneurysm. No abdominopelvic adenopathy. Reproductive: Seed implants identified within the prostate gland. Other: No free fluid or fluid collections. There is a right inguinal hernia which contains fat and a nonobstructed loop of small bowel, image 74/2. Musculoskeletal: There is lumbar spondylosis. First degree anterolisthesis of L4 on L5 noted. IMPRESSION: 1. No acute findings within the abdomen or pelvis. 2. Right inguinal hernia contains fat and a nonobstructed loop of small bowel. 3. Markedly dilated distal esophagus compatible with clinical history of achalasia. 4. Nonobstructing left renal calculi. 5. Aortic Atherosclerosis (ICD10-I70.0). Electronically Signed   By: Signa Kell M.D.   On: 03/15/2021 08:30     Assessment & Plan:   Problem List Items Addressed This Visit     Essential hypertension - Primary    Chronic Losartan HCT - not taking On Amlodipine, Terazosyn Pt ran out of Amlodipine      Relevant Medications   amLODipine (NORVASC) 5 MG tablet   terazosin (HYTRIN) 10 MG capsule   apixaban (ELIQUIS) 5 MG TABS tablet   amLODipine (NORVASC) 5 MG tablet   Other Relevant Orders   TSH   Urinalysis   CBC with Differential/Platelet   Lipid panel   PSA   Comprehensive metabolic panel   DVT of deep femoral vein (HCC)    DVT x3      Relevant Medications   amLODipine (NORVASC) 5 MG tablet   terazosin (HYTRIN) 10 MG capsule   apixaban (ELIQUIS) 5 MG TABS tablet   amLODipine (NORVASC) 5 MG tablet   Anticoagulation management encounter   Relevant Orders   Comprehensive metabolic panel   Malignant neoplasm of prostate (HCC)    Check PSA      Relevant Orders   TSH   Urinalysis   CBC with Differential/Platelet   Lipid panel   PSA  Comprehensive metabolic panel      Meds ordered this encounter  Medications   amLODipine (NORVASC) 5 MG tablet    Sig: Take 1 tablet (5 mg total) by mouth daily.    Dispense:  90 tablet    Refill:  3   pantoprazole (PROTONIX) 40 MG tablet    Sig: Take 1 tablet (40 mg total) by mouth daily.    Dispense:  90 tablet    Refill:  3   terazosin (HYTRIN) 10 MG capsule    Sig: Take 1 capsule (10 mg total) by mouth at bedtime.    Dispense:  90 capsule    Refill:  3   apixaban (ELIQUIS) 5 MG TABS tablet    Sig: Take 1 tablet (5 mg total) by mouth 2 (two) times daily.    Dispense:  180 tablet    Refill:  3   amLODipine (NORVASC) 5 MG tablet    Sig: Take 1 tablet (5 mg total) by mouth daily.    Dispense:  10 tablet    Refill:  0      Follow-up: Return in about 6 months (around 01/05/2023) for a follow-up visit.  Sonda Primes, MD

## 2022-07-06 NOTE — Assessment & Plan Note (Signed)
Check PSA. ?

## 2022-07-06 NOTE — Assessment & Plan Note (Signed)
DVT x3

## 2022-07-06 NOTE — Assessment & Plan Note (Addendum)
Chronic Losartan HCT - not taking On Amlodipine, Terazosyn Pt ran out of Amlodipine

## 2022-07-09 ENCOUNTER — Other Ambulatory Visit (INDEPENDENT_AMBULATORY_CARE_PROVIDER_SITE_OTHER): Payer: Medicare HMO

## 2022-07-09 DIAGNOSIS — C61 Malignant neoplasm of prostate: Secondary | ICD-10-CM | POA: Diagnosis not present

## 2022-07-09 DIAGNOSIS — I10 Essential (primary) hypertension: Secondary | ICD-10-CM | POA: Diagnosis not present

## 2022-07-09 DIAGNOSIS — Z5181 Encounter for therapeutic drug level monitoring: Secondary | ICD-10-CM | POA: Diagnosis not present

## 2022-07-09 DIAGNOSIS — Z7901 Long term (current) use of anticoagulants: Secondary | ICD-10-CM | POA: Diagnosis not present

## 2022-07-09 LAB — CBC WITH DIFFERENTIAL/PLATELET
Basophils Absolute: 0 10*3/uL (ref 0.0–0.1)
Basophils Relative: 0.4 % (ref 0.0–3.0)
Eosinophils Absolute: 0.1 10*3/uL (ref 0.0–0.7)
Eosinophils Relative: 0.9 % (ref 0.0–5.0)
HCT: 36.8 % — ABNORMAL LOW (ref 39.0–52.0)
Hemoglobin: 11.8 g/dL — ABNORMAL LOW (ref 13.0–17.0)
Lymphocytes Relative: 23.3 % (ref 12.0–46.0)
Lymphs Abs: 1.9 10*3/uL (ref 0.7–4.0)
MCHC: 32 g/dL (ref 30.0–36.0)
MCV: 79.7 fl (ref 78.0–100.0)
Monocytes Absolute: 0.6 10*3/uL (ref 0.1–1.0)
Monocytes Relative: 7.5 % (ref 3.0–12.0)
Neutro Abs: 5.6 10*3/uL (ref 1.4–7.7)
Neutrophils Relative %: 67.9 % (ref 43.0–77.0)
Platelets: 223 10*3/uL (ref 150.0–400.0)
RBC: 4.61 Mil/uL (ref 4.22–5.81)
RDW: 14.5 % (ref 11.5–15.5)
WBC: 8.3 10*3/uL (ref 4.0–10.5)

## 2022-07-09 LAB — URINALYSIS
Bilirubin Urine: NEGATIVE
Hgb urine dipstick: NEGATIVE
Ketones, ur: NEGATIVE
Leukocytes,Ua: NEGATIVE
Nitrite: NEGATIVE
Specific Gravity, Urine: 1.02 (ref 1.000–1.030)
Total Protein, Urine: NEGATIVE
Urine Glucose: NEGATIVE
Urobilinogen, UA: 0.2 (ref 0.0–1.0)
pH: 6 (ref 5.0–8.0)

## 2022-07-09 LAB — LIPID PANEL
Cholesterol: 120 mg/dL (ref 0–200)
HDL: 32.8 mg/dL — ABNORMAL LOW (ref >39.00)
LDL Cholesterol: 71 mg/dL (ref 0–99)
NonHDL: 86.77
Total CHOL/HDL Ratio: 4
Triglycerides: 78 mg/dL (ref 0.0–149.0)
VLDL: 15.6 mg/dL (ref 0.0–40.0)

## 2022-07-09 LAB — COMPREHENSIVE METABOLIC PANEL
ALT: 10 U/L (ref 0–53)
AST: 12 U/L (ref 0–37)
Albumin: 3.4 g/dL — ABNORMAL LOW (ref 3.5–5.2)
Alkaline Phosphatase: 63 U/L (ref 39–117)
BUN: 15 mg/dL (ref 6–23)
CO2: 26 mEq/L (ref 19–32)
Calcium: 8.7 mg/dL (ref 8.4–10.5)
Chloride: 105 mEq/L (ref 96–112)
Creatinine, Ser: 0.98 mg/dL (ref 0.40–1.50)
GFR: 72.94 mL/min (ref >60.00)
Glucose, Bld: 99 mg/dL (ref 70–99)
Potassium: 4 mEq/L (ref 3.5–5.1)
Sodium: 139 mEq/L (ref 135–145)
Total Bilirubin: 0.6 mg/dL (ref 0.2–1.2)
Total Protein: 6.3 g/dL (ref 6.0–8.3)

## 2022-07-09 LAB — TSH: TSH: 1.14 u[IU]/mL (ref 0.35–5.50)

## 2022-07-09 LAB — PSA: PSA: 0.01 ng/mL — ABNORMAL LOW (ref 0.10–4.00)

## 2022-07-16 ENCOUNTER — Encounter: Payer: Medicare HMO | Admitting: Internal Medicine

## 2022-10-08 ENCOUNTER — Ambulatory Visit (INDEPENDENT_AMBULATORY_CARE_PROVIDER_SITE_OTHER): Payer: Medicare HMO

## 2022-10-08 VITALS — Ht 70.0 in | Wt 178.0 lb

## 2022-10-08 DIAGNOSIS — Z0000 Encounter for general adult medical examination without abnormal findings: Secondary | ICD-10-CM

## 2022-10-08 NOTE — Progress Notes (Cosign Needed)
Subjective:   Dominic Shaffer is a 82 y.o. male who presents for Medicare Annual/Subsequent preventive examination.  Visit Complete: Virtual  I connected with  Dominic Shaffer on 10/08/22 by a audio enabled telemedicine application and verified that I am speaking with the correct person using two identifiers.  Patient Location: Home  Provider Location: Office/Clinic  I discussed the limitations of evaluation and management by telemedicine. The patient expressed understanding and agreed to proceed.  Patient Medicare AWV questionnaire was completed by the patient on 10/06/2022; I have confirmed that all information answered by patient is correct and no changes since this date.  Vital Signs: Because this visit was a virtual/telehealth visit, some criteria may be missing or patient reported. Any vitals not documented were not able to be obtained and vitals that have been documented are patient reported.    Review of Systems     Cardiac Risk Factors include: advanced age (>10men, >56 women);sedentary lifestyle;family history of premature cardiovascular disease;hypertension;male gender     Objective:    Today's Vitals   10/08/22 0918 10/08/22 0933  Weight: 178 lb (80.7 kg)   Height: 5\' 10"  (1.778 m)   PainSc: 3  3   PainLoc: Back    Body mass index is 25.54 kg/m.     10/08/2022    9:34 AM 02/20/2021    9:04 AM 09/24/2019    2:04 PM 09/07/2015   11:37 AM 07/30/2015    6:41 PM 07/30/2015    3:57 PM 10/05/2014   10:48 AM  Advanced Directives  Does Patient Have a Medical Advance Directive? Yes Yes Yes Yes No;Yes No Yes  Type of Estate agent of [place];Living will Healthcare Power of [place];Living will Healthcare Power of [place];Living will Healthcare Power of [place];Living will Healthcare Power of [place];Living will  Healthcare Power of [place];Living will  Does patient want to make changes to medical advance directive?   No - Patient declined No -  Patient declined No - Patient declined    Copy of Healthcare Power of Attorney in Chart? No - copy requested No - copy requested No - copy requested No - copy requested No - copy requested      Current Medications (verified) Outpatient Encounter Medications as of 10/08/2022  Medication Sig   amLODipine (NORVASC) 5 MG tablet Take 1 tablet (5 mg total) by mouth daily.   amLODipine (NORVASC) 5 MG tablet Take 1 tablet (5 mg total) by mouth daily.   apixaban (ELIQUIS) 5 MG TABS tablet Take 1 tablet (5 mg total) by mouth 2 (two) times daily.   diclofenac Sodium (VOLTAREN) 1 % GEL Apply 2 g topically 4 (four) times daily. To affected joint.   diphenhydrAMINE (BENADRYL ALLERGY) 25 mg capsule Patient to take 2 tablets one hour prior to test   famotidine (PEPCID) 40 MG tablet Take 1 tablet (40 mg total) by mouth daily.   furosemide (LASIX) 20 MG tablet Take 1 tablet (20 mg total) by mouth daily as needed. FOR SWELLING   hydrocortisone 2.5 % cream Apply topically 2 (two) times daily.   IRON PO Take 65 mg by mouth daily.   Multiple Vitamin (MULTIVITAMIN) tablet Take 1 tablet by mouth daily.   pantoprazole (PROTONIX) 40 MG tablet Take 1 tablet (40 mg total) by mouth daily.   terazosin (HYTRIN) 10 MG capsule Take 1 capsule (10 mg total) by mouth at bedtime.   No facility-administered encounter medications on file as of 10/08/2022.    Allergies (verified) Iodinated  contrast media and Shellfish allergy   History: Past Medical History:  Diagnosis Date   Asymptomatic stenosis of left carotid artery without infarction    per duplex LICA 40-59% (07-11-2011)   At risk for sleep apnea    STOP-BANG= 4     SENT TO PCP 12-23-2013   BPH with urinary obstruction    hyperplasia   Colon polyps    Congenital renal cyst, single    Coronary artery disease    cardiologist--  dr Eden Emms-- myoview 07-05-2010 poss. small inferior wall infact/  no ischemia/ ef 51%/  cardic CT score 28 and <50% LAD/D! disease    Depression    Diverticulitis    ED (erectile dysfunction)    secondary arterial insuffiency   Esophageal achalasia    s/p multiple dilatations and botox injection tx and surgical intervention 06-17-2011  Heller Myotomy   Hematospermia    History of DVT of lower extremity    06/ 2012   History of esophageal dilatation    and botox injection tx's at [facility]   Hypertension    Internal hemorrhoid 2003   Dr. Marina Goodell   LBBB (left bundle branch block)    LBP (low back pain)    Nocturia    Prostate cancer [facility])    Wears glasses    Past Surgical History:  Procedure Laterality Date   CARDIOVASCULAR STRESS TEST  07-05-2010  dr Eden Emms   Adenosine nuclear study/  no significant ST segment change suggestive of ischemia/  possible small inferior wall infarct at mid and basal level/ ef 51%/  LBBB/  normal wall motion   COLONOSCOPY  last one 2009   INGUINAL HERNIA REPAIR Left 10/13/2012   Procedure: OPEN LEFT INGUINAL HERNIA REPAIR WITH ON Q PUMP;  Surgeon: Adolph Pollack, MD;  Location: [facility];  Service: General;  Laterality: Left;   INSERTION OF MESH Left 10/13/2012   Procedure: INSERTION OF MESH;  Surgeon: Adolph Pollack, MD;  Location: [facility];  Service: General;  Laterality: Left;   IR RADIOLOGIST EVAL & MGMT  10/11/2016   LAPAROSCOPIC HELLER MYOTOMY  06-16-2001   w/ intraoperative upper endoscopy [place] (for achalasia)   RADIOACTIVE SEED IMPLANT N/A 12/31/2013   Procedure: RADIOACTIVE SEED IMPLANT;  Surgeon: Anner Crete, MD;  Location: [facility];  Service: Urology;  Laterality: N/A;  seeds implanted 78 no seeds found in bladder   TRANSTHORACIC ECHOCARDIOGRAM  07-05-2010   mild LVH/ septal motion consistent w/ LBBB/ ef 45-50%/  diffuse hypokinesis/  grade I diastolic dysfunction/ mild LAE/  trivial TR   UMBILICAL HERNIA REPAIR  11-11-2005   Family History  Problem Relation Age of Onset   Heart disease Mother 63       CAD   Kidney disease Mother    Cancer Father 1        bladder ca; passed in 1999   Bladder Cancer Father    Hypertension Other    Colon cancer Neg Hx    Esophageal cancer Neg Hx    Prostate cancer Neg Hx    Pancreatic cancer Neg Hx    Stomach cancer Neg Hx    Social History   Socioeconomic History   Marital status: Married    Spouse name: Not on file   Number of children: 3   Years of education: Not on file   Highest education level: Not on file  Occupational History   Occupation: HR  Tobacco Use   Smoking status: Former  Current packs/day: 0.00    Average packs/day: 0.3 packs/day for 0.5 years (0.1 ttl pk-yrs)    Types: Cigarettes    Start date: 01/02/1965    Quit date: 07/03/1965    Years since quitting: 57.3   Smokeless tobacco: Never  Vaping Use   Vaping status: Never Used  Substance and Sexual Activity   Alcohol use: No   Drug use: No   Sexual activity: Not on file    Comment: one pack of cigarettes would last a week  Other Topics Concern   Not on file  Social History Narrative   Regular exercise-no   Social Determinants of Health   Financial Resource Strain: Low Risk  (10/08/2022)   Overall Financial Resource Strain (CARDIA)    Difficulty of Paying Living Expenses: Not very hard  Food Insecurity: No Food Insecurity (10/08/2022)   Hunger Vital Sign    Worried About Running Out of Food in the Last Year: Never true    Ran Out of Food in the Last Year: Never true  Transportation Needs: No Transportation Needs (10/08/2022)   PRAPARE - Administrator, Civil Service (Medical): No    Lack of Transportation (Non-Medical): No  Physical Activity: Insufficiently Active (10/08/2022)   Exercise Vital Sign    Days of Exercise per Week: 1 day    Minutes of Exercise per Session: 20 min  Stress: No Stress Concern Present (10/08/2022)   [employer] of Occupational Health - Occupational Stress Questionnaire    Feeling of Stress : Only a little  Social Connections: Unknown (10/08/2022)   Social Connection and  Isolation Panel [NHANES]    Frequency of Communication with Friends and Family: Once a week    Frequency of Social Gatherings with Friends and Family: Twice a week    Attends Religious Services: Not on Marketing executive or Organizations: No    Attends Banker Meetings: Never    Marital Status: Married    Tobacco Counseling Counseling given: Not Answered   Clinical Intake:  Pre-visit preparation completed: Yes  Pain : 0-10 Pain Score: 3  Pain Type: Chronic pain     BMI - recorded: 25.54 Nutritional Risks: None Diabetes: No  How often do you need to have someone help you when you read instructions, pamphlets, or other written materials from your doctor or pharmacy?: 1 - Never What is the last grade level you completed in school?: COLLEGE GRADUATE  Interpreter Needed?: No  Information entered by :: Susie Cassette, LPN.   Activities of Daily Living    10/08/2022    9:42 AM 10/06/2022    2:22 PM  In your present state of health, do you have any difficulty performing the following activities:  Hearing? 0 0  Vision? 0 0  Difficulty concentrating or making decisions? 0 0  Walking or climbing stairs? 0 0  Dressing or bathing? 0 0  Doing errands, shopping? 0 0  Preparing Food and eating ? Y N  Using the Toilet? Y N  In the past six months, have you accidently leaked urine? Y N  Do you have problems with loss of bowel control? Y N  Managing your Medications? Y N  Managing your Finances? Y N  Housekeeping or managing your Housekeeping? Y N    Patient Care Team: Tresa Garter, MD as PCP - General Irish Lack, MD (Radiology) Bjorn Pippin, MD (Urology) Hilarie Fredrickson, MD as Consulting Physician (Gastroenterology) Wendall Stade, MD  as Consulting Physician (Cardiology)  Indicate any recent Medical Services you may have received from other than [facility] providers in the past year (date may be approximate).     Assessment:   This is a  routine wellness examination for Elizabeth.  Hearing/Vision screen Hearing Screening - Comments:: Patient has hearing difficulty and wears hearing aids. Vision Screening - Comments:: Patient does wear corrective lenses/contacts.  Annual eye exam done by: Blima Ledger, OD.    Goals Addressed             This Visit's Progress    Patient Stated: Continue to remain healthy and manage my independence.        Depression Screen    10/08/2022    9:35 AM 07/06/2022    3:42 PM 03/26/2022    1:16 PM 02/20/2021    9:04 AM 02/20/2021    9:02 AM 01/16/2021    2:03 PM 09/24/2019    2:06 PM  PHQ 2/9 Scores  PHQ - 2 Score 0 0 0 0 0 0 0  PHQ- 9 Score 0     0     Fall Risk    10/08/2022    9:35 AM 10/06/2022    2:22 PM 07/06/2022    3:42 PM 03/26/2022    1:16 PM 02/20/2021    9:04 AM  Fall Risk   Falls in the past year? 0 0 0 0 0  Number falls in past yr: 0  0 0 0  Injury with Fall? 0  0 0 0  Risk for fall due to : No Fall Risks  No Fall Risks No Fall Risks   Follow up Falls prevention discussed  Falls evaluation completed Falls evaluation completed Falls evaluation completed    MEDICARE RISK AT HOME: Medicare Risk at Home Any stairs in or around the home?: Yes If so, are there any without handrails?: No Home free of loose throw rugs in walkways, pet beds, electrical cords, etc?: Yes Adequate lighting in your home to reduce risk of falls?: Yes Life alert?: No Use of a cane, walker or w/c?: No Grab bars in the bathroom?: No Shower chair or bench in shower?: Yes Elevated toilet seat or a handicapped toilet?: Yes  TIMED UP AND GO:  Was the test performed?  No    Cognitive Function:        10/08/2022    9:42 AM  6CIT Screen  What Year? 0 points  What month? 0 points  What time? 0 points  Count back from 20 0 points  Months in reverse 0 points  Repeat phrase 0 points  Total Score 0 points    Immunizations Immunization History  Administered Date(s) Administered    Influenza-Unspecified 11/03/2015, 11/06/2017   Moderna Sars-Covid-2 Vaccination 02/15/2019, 03/10/2019, 11/23/2019, 05/21/2020, 11/07/2020   Zoster Recombinant(Shingrix) 09/24/2019    TDAP status: Due, Education has been provided regarding the importance of this vaccine. Advised may receive this vaccine at local pharmacy or Health Dept. Aware to provide a copy of the vaccination record if obtained from local pharmacy or Health Dept. Verbalized acceptance and understanding.  Flu Vaccine status: Declined, Education has been provided regarding the importance of this vaccine but patient still declined. Advised may receive this vaccine at local pharmacy or Health Dept. Aware to provide a copy of the vaccination record if obtained from local pharmacy or Health Dept. Verbalized acceptance and understanding.  Pneumococcal vaccine status: Declined,  Education has been provided regarding the importance of this vaccine but patient  still declined. Advised may receive this vaccine at local pharmacy or Health Dept. Aware to provide a copy of the vaccination record if obtained from local pharmacy or Health Dept. Verbalized acceptance and understanding.   Covid-19 vaccine status: Completed vaccines  Qualifies for Shingles Vaccine? Yes   Zostavax completed No   Shingrix Completed?: No.    Education has been provided regarding the importance of this vaccine. Patient has been advised to call insurance company to determine out of pocket expense if they have not yet received this vaccine. Advised may also receive vaccine at local pharmacy or Health Dept. Verbalized acceptance and understanding.  Screening Tests Health Maintenance  Topic Date Due   DTaP/Tdap/Td (1 - Tdap) Never done   Pneumonia Vaccine 65+ Years old (1 of 1 - PCV) Never done   Zoster Vaccines- Shingrix (2 of 2) 11/19/2019   INFLUENZA VACCINE  08/30/2022   Medicare Annual Wellness (AWV)  10/08/2023   HPV VACCINES  Aged Out   COVID-19 Vaccine   Discontinued    Health Maintenance  Health Maintenance Due  Topic Date Due   DTaP/Tdap/Td (1 - Tdap) Never done   Pneumonia Vaccine 81+ Years old (1 of 1 - PCV) Never done   Zoster Vaccines- Shingrix (2 of 2) 11/19/2019   INFLUENZA VACCINE  08/30/2022    Colorectal cancer screening: No longer required.   Lung Cancer Screening: (Low Dose CT Chest recommended if Age 57-80 years, 20 pack-year currently smoking OR have quit w/in 15years.) does not qualify.   Lung Cancer Screening Referral: no  Additional Screening:  Hepatitis C Screening: does not qualify.  Vision Screening: Recommended annual ophthalmology exams for early detection of glaucoma and other disorders of the eye. Is the patient up to date with their annual eye exam?  Yes  Who is the provider or what is the name of the office in which the patient attends annual eye exams? Blima Ledger, OD. If pt is not established with a provider, would they like to be referred to a provider to establish care? No .   Dental Screening: Recommended annual dental exams for proper oral hygiene  Diabetic Foot Exam: N/A  Community Resource Referral / Chronic Care Management: CRR required this visit?  No   CCM required this visit?  No     Plan:     I have personally reviewed and noted the following in the patient's chart:   Medical and social history Use of alcohol, tobacco or illicit drugs  Current medications and supplements including opioid prescriptions. Patient is not currently taking opioid prescriptions. Functional ability and status Nutritional status Physical activity Advanced directives List of other physicians Hospitalizations, surgeries, and ER visits in previous 12 months Vitals Screenings to include cognitive, depression, and falls Referrals and appointments  In addition, I have reviewed and discussed with patient certain preventive protocols, quality metrics, and best practice recommendations. A written  personalized care plan for preventive services as well as general preventive health recommendations were provided to patient.     Mickeal Needy, LPN   01/29/9145   After Visit Summary: (Mail) Due to this being a telephonic visit, the after visit summary with patients personalized plan was offered to patient via mail   Nurse Notes: Normal cognitive status assessed by direct observation via telephone conversation by this Nurse Health Advisor. No abnormalities found.   Medical screening examination/treatment/procedure(s) were performed by non-physician practitioner and as supervising physician I was immediately available for consultation/collaboration.  I agree with above.  Jacinta Shoe, MD

## 2022-10-08 NOTE — Patient Instructions (Addendum)
Mr. Al , Thank you for taking time to come for your Medicare Wellness Visit. I appreciate your ongoing commitment to your health goals. Please review the following plan we discussed and let me know if I can assist you in the future.   Referrals/Orders/Follow-Ups/Clinician Recommendations: No  This is a list of the screening recommended for you and due dates:  Health Maintenance  Topic Date Due   DTaP/Tdap/Td vaccine (1 - Tdap) Never done   Pneumonia Vaccine (1 of 1 - PCV) Never done   Zoster (Shingles) Vaccine (2 of 2) 11/19/2019   Flu Shot  08/30/2022   Medicare Annual Wellness Visit  10/08/2023   HPV Vaccine  Aged Out   COVID-19 Vaccine  Discontinued    Advanced directives: (Copy Requested) Please bring a copy of your health care power of attorney and living will to the office to be added to your chart at your convenience.  Next Medicare Annual Wellness Visit scheduled for next year: Yes

## 2022-10-17 ENCOUNTER — Ambulatory Visit (INDEPENDENT_AMBULATORY_CARE_PROVIDER_SITE_OTHER): Payer: Medicare HMO | Admitting: Internal Medicine

## 2022-10-17 ENCOUNTER — Encounter: Payer: Self-pay | Admitting: Internal Medicine

## 2022-10-17 VITALS — BP 130/64 | HR 58 | Temp 98.1°F | Ht 70.0 in | Wt 184.0 lb

## 2022-10-17 DIAGNOSIS — M545 Low back pain, unspecified: Secondary | ICD-10-CM

## 2022-10-17 DIAGNOSIS — C61 Malignant neoplasm of prostate: Secondary | ICD-10-CM | POA: Diagnosis not present

## 2022-10-17 DIAGNOSIS — E785 Hyperlipidemia, unspecified: Secondary | ICD-10-CM | POA: Diagnosis not present

## 2022-10-17 DIAGNOSIS — N410 Acute prostatitis: Secondary | ICD-10-CM | POA: Diagnosis not present

## 2022-10-17 DIAGNOSIS — R634 Abnormal weight loss: Secondary | ICD-10-CM | POA: Insufficient documentation

## 2022-10-17 DIAGNOSIS — I10 Essential (primary) hypertension: Secondary | ICD-10-CM | POA: Diagnosis not present

## 2022-10-17 DIAGNOSIS — N419 Inflammatory disease of prostate, unspecified: Secondary | ICD-10-CM | POA: Insufficient documentation

## 2022-10-17 MED ORDER — CIPROFLOXACIN HCL 500 MG PO TABS: 500.0000 mg | ORAL_TABLET | Freq: Two times a day (BID) | ORAL | 0 refills | Status: AC

## 2022-10-17 NOTE — Progress Notes (Signed)
Subjective:  Patient ID: Dominic Shaffer, male    DOB: [birth year]  Age: 82 y.o. MRN: [id number]  CC: Back Pain (Concerned about knot on sternum that appeared x2 weeks ago, also experiencing fatigue and urinary infection sxs)   HPI Dominic Shaffer presents for a hard place on the sternum C/o LBP C/o prostatitis sx - Dr Annabell Howells treated in the past w/abx Lost wt on diet 190 to 175 lbs or so  Outpatient Medications Prior to Visit  Medication Sig Dispense Refill  . amLODipine (NORVASC) 5 MG tablet Take 1 tablet (5 mg total) by mouth daily. 90 tablet 3  . apixaban (ELIQUIS) 5 MG TABS tablet Take 1 tablet (5 mg total) by mouth 2 (two) times daily. 180 tablet 3  . diclofenac Sodium (VOLTAREN) 1 % GEL Apply 2 g topically 4 (four) times daily. To affected joint. 150 g 11  . diphenhydrAMINE (BENADRYL ALLERGY) 25 mg capsule Patient to take 2 tablets one hour prior to test 1 capsule 0  . famotidine (PEPCID) 40 MG tablet Take 1 tablet (40 mg total) by mouth daily. 90 tablet 1  . hydrocortisone 2.5 % cream Apply topically 2 (two) times daily. 40 g 1  . IRON PO Take 65 mg by mouth daily.    . Multiple Vitamin (MULTIVITAMIN) tablet Take 1 tablet by mouth daily.    . pantoprazole (PROTONIX) 40 MG tablet Take 1 tablet (40 mg total) by mouth daily. 90 tablet 3  . terazosin (HYTRIN) 10 MG capsule Take 1 capsule (10 mg total) by mouth at bedtime. 90 capsule 3  . amLODipine (NORVASC) 5 MG tablet Take 1 tablet (5 mg total) by mouth daily. 10 tablet 0  . furosemide (LASIX) 20 MG tablet Take 1 tablet (20 mg total) by mouth daily as needed. FOR SWELLING 30 tablet 6   No facility-administered medications prior to visit.    ROS: Review of Systems  Constitutional:  Negative for appetite change, fatigue and unexpected weight change.  HENT:  Negative for congestion, nosebleeds, sneezing, sore throat and trouble swallowing.   Eyes:  Negative for itching and visual disturbance.  Respiratory:  Negative for  cough.   Cardiovascular:  Negative for chest pain, palpitations and leg swelling.  Gastrointestinal:  Negative for abdominal distention, blood in stool, diarrhea and nausea.  Genitourinary:  Positive for frequency and urgency. Negative for hematuria.  Musculoskeletal:  Positive for back pain. Negative for gait problem, joint swelling and neck pain.  Skin:  Negative for rash.  Neurological:  Negative for dizziness, tremors, speech difficulty and weakness.  Psychiatric/Behavioral:  Negative for agitation, dysphoric mood, sleep disturbance and suicidal ideas. The patient is not nervous/anxious.     Objective:  BP 130/64 (BP Location: Left Arm, Patient Position: Sitting, Cuff Size: Large)   Pulse (!) 58   Temp 98.1 F (36.7 C) (Oral)   Ht 5\' 10"  (1.778 m)   Wt 184 lb (83.5 kg)   SpO2 97%   BMI 26.40 kg/m   BP Readings from Last 3 Encounters:  10/17/22 130/64  07/06/22 (!) 148/86  04/21/21 (!) 148/70    Wt Readings from Last 3 Encounters:  10/17/22 184 lb (83.5 kg)  10/08/22 178 lb (80.7 kg)  07/06/22 182 lb (82.6 kg)    Physical Exam Constitutional:      General: He is not in acute distress.    Appearance: He is well-developed.     Comments: NAD  Eyes:     Conjunctiva/sclera: Conjunctivae normal.  Pupils: Pupils are equal, round, and reactive to light.  Neck:     Thyroid: No thyromegaly.     Vascular: No JVD.  Cardiovascular:     Rate and Rhythm: Normal rate and regular rhythm.     Heart sounds: Normal heart sounds. No murmur heard.    No friction rub. No gallop.  Pulmonary:     Effort: Pulmonary effort is normal. No respiratory distress.     Breath sounds: Normal breath sounds. No wheezing or rales.  Chest:     Chest wall: No tenderness.  Abdominal:     General: Bowel sounds are normal. There is no distension.     Palpations: Abdomen is soft. There is no mass.     Tenderness: There is no abdominal tenderness. There is no guarding or rebound.  Musculoskeletal:         General: No tenderness. Normal range of motion.     Cervical back: Normal range of motion.  Lymphadenopathy:     Cervical: No cervical adenopathy.  Skin:    General: Skin is warm and dry.     Findings: No rash.  Neurological:     Mental Status: He is alert and oriented to person, place, and time.     Cranial Nerves: No cranial nerve deficit.     Motor: No weakness or abnormal muscle tone.     Coordination: Coordination normal.     Gait: Gait normal.     Deep Tendon Reflexes: Reflexes are normal and symmetric.  Psychiatric:        Behavior: Behavior normal.        Thought Content: Thought content normal.        Judgment: Judgment normal.    Lab Results  Component Value Date   WBC 8.3 07/09/2022   HGB 11.8 (L) 07/09/2022   HCT 36.8 (L) 07/09/2022   PLT 223.0 07/09/2022   GLUCOSE 99 07/09/2022   CHOL 120 07/09/2022   TRIG 78.0 07/09/2022   HDL 32.80 (L) 07/09/2022   LDLCALC 71 07/09/2022   ALT 10 07/09/2022   AST 12 07/09/2022   NA 139 07/09/2022   K 4.0 07/09/2022   CL 105 07/09/2022   CREATININE 0.98 07/09/2022   BUN 15 07/09/2022   CO2 26 07/09/2022   TSH 1.14 07/09/2022   PSA 0.01 (L) 07/09/2022   INR 0.99 10/25/2016    CT Abdomen Pelvis W Contrast  Result Date: 03/15/2021 CLINICAL DATA:  Two month history of low abdominal pain. EXAM: CT ABDOMEN AND PELVIS WITH CONTRAST TECHNIQUE: Multidetector CT imaging of the abdomen and pelvis was performed using the standard protocol following bolus administration of intravenous contrast. RADIATION DOSE REDUCTION: This exam was performed according to the departmental dose-optimization program which includes automated exposure control, adjustment of the mA and/or kV according to patient size and/or use of iterative reconstruction technique. CONTRAST:  OMNIPAQUE IOHEXOL 300 MG/ML  SOLN COMPARISON:  08/10/2014 FINDINGS: Lower chest: No acute abnormality. Hepatobiliary: Right lobe of liver cyst measures 1.3 cm. Small low  attenuation structure within the periphery of the right hepatic lobe measures 4 mm, image 16/2. This is technically too small to reliably characterize. No suspicious liver lesions identified at this time. Gallbladder appears normal. No bile duct dilatation. Pancreas: Unremarkable. No pancreatic ductal dilatation or surrounding inflammatory changes. Spleen: Normal in size without focal abnormality. Adrenals/Urinary Tract: Normal adrenal glands. There is a cyst arising off the posterior cortex of the interpolar left kidney measuring 4.1 cm. There is a  focal area of cortical scarring within the inferior pole of the left kidney, image 29/2. There are 2 stones within the inferior pole collecting system of the left kidney which measure up to 3 mm. No hydronephrosis or hydroureter identified bilaterally. Urinary bladder is unremarkable. Stomach/Bowel: The distal esophagus appears markedly dilated compatible with clinical history of achalasia, image 1/2. Stomach appears normal. The appendix is visualized and is within normal limits. Scattered small distal colonic diverticula noted without signs of acute inflammation. Vascular/Lymphatic: Aortic atherosclerosis. No aneurysm. No abdominopelvic adenopathy. Reproductive: Seed implants identified within the prostate gland. Other: No free fluid or fluid collections. There is a right inguinal hernia which contains fat and a nonobstructed loop of small bowel, image 74/2. Musculoskeletal: There is lumbar spondylosis. First degree anterolisthesis of L4 on L5 noted. IMPRESSION: 1. No acute findings within the abdomen or pelvis. 2. Right inguinal hernia contains fat and a nonobstructed loop of small bowel. 3. Markedly dilated distal esophagus compatible with clinical history of achalasia. 4. Nonobstructing left renal calculi. 5. Aortic Atherosclerosis (ICD10-I70.0). Electronically Signed   By: Signa Kell M.D.   On: 03/15/2021 08:30    Assessment & Plan:   Problem List Items  Addressed This Visit     Essential hypertension - Primary    On Amlodipine, Terazosyn      Relevant Orders   Comprehensive metabolic panel   CBC with Differential/Platelet   LBP (low back pain)    [facility]  Better       Malignant neoplasm of prostate (HCC)   Relevant Medications   ciprofloxacin (CIPRO) 500 MG tablet   Other Relevant Orders   PSA   Prostatitis     New prostatitis sx - Dr Annabell Howells treated in the past w/abx      Relevant Orders   CBC with Differential/Platelet   PSA   Weight loss    On diet      Relevant Orders   CBC with Differential/Platelet   Sedimentation rate   T4, free   Sedimentation rate   Other Visit Diagnoses     Dyslipidemia       Relevant Orders   Sedimentation rate   Lipid panel         Meds ordered this encounter  Medications  . ciprofloxacin (CIPRO) 500 MG tablet    Sig: Take 1 tablet (500 mg total) by mouth 2 (two) times daily for 10 days.    Dispense:  20 tablet    Refill:  0      Follow-up: Return in about 3 months (around 01/16/2023) for a follow-up visit.  Sonda Primes, MD

## 2022-10-17 NOTE — Assessment & Plan Note (Signed)
[facility]  Better

## 2022-10-17 NOTE — Assessment & Plan Note (Signed)
New prostatitis sx - Dr Annabell Howells treated in the past w/abx

## 2022-10-17 NOTE — Assessment & Plan Note (Signed)
On diet

## 2022-10-17 NOTE — Assessment & Plan Note (Signed)
On Amlodipine, Terazosyn

## 2022-10-23 NOTE — Addendum Note (Signed)
Addended by: Tresa Garter on: 10/23/2022 12:48 AM   Modules accepted: Level of Service

## 2022-11-06 ENCOUNTER — Other Ambulatory Visit: Payer: Self-pay | Admitting: Internal Medicine

## 2023-02-01 ENCOUNTER — Other Ambulatory Visit: Payer: Self-pay | Admitting: Internal Medicine

## 2023-05-29 ENCOUNTER — Ambulatory Visit: Admitting: Internal Medicine

## 2023-05-29 ENCOUNTER — Encounter: Payer: Self-pay | Admitting: Internal Medicine

## 2023-05-29 VITALS — BP 122/48 | HR 68 | Temp 98.8°F | Ht 70.0 in | Wt 181.0 lb

## 2023-05-29 DIAGNOSIS — J029 Acute pharyngitis, unspecified: Secondary | ICD-10-CM | POA: Diagnosis not present

## 2023-05-29 DIAGNOSIS — K220 Achalasia of cardia: Secondary | ICD-10-CM | POA: Diagnosis not present

## 2023-05-29 DIAGNOSIS — R490 Dysphonia: Secondary | ICD-10-CM | POA: Diagnosis not present

## 2023-05-29 DIAGNOSIS — I10 Essential (primary) hypertension: Secondary | ICD-10-CM | POA: Diagnosis not present

## 2023-05-29 NOTE — Assessment & Plan Note (Addendum)
 F/u w/Dr Elvin Hammer Consider botox injections On Pantoprazole  and Famotidine 

## 2023-05-29 NOTE — Assessment & Plan Note (Signed)
 New ENT ref

## 2023-05-29 NOTE — Progress Notes (Signed)
 Subjective:  Patient ID: Rueben Cote, male    DOB: 1940/04/19  Age: 83 y.o. MRN: [id number]  CC: Sore Throat (Sore throat, hoarse, sometimes feels as though there is sputum. Patient had been diagnosed with swallowing disorder in the past. Has history of acid reflux issues as well as seasonal pollen)   HPI Rueben Cote presents for ST, sputum, hoarseness and raspy voice x 3 months,    Outpatient Medications Prior to Visit  Medication Sig Dispense Refill   amLODipine  (NORVASC ) 5 MG tablet Take 1 tablet (5 mg total) by mouth daily. 90 tablet 3   apixaban  (ELIQUIS ) 5 MG TABS tablet Take 1 tablet (5 mg total) by mouth 2 (two) times daily. 180 tablet 3   diclofenac  Sodium (VOLTAREN ) 1 % GEL Apply 2 g topically 4 (four) times daily. To affected joint. 150 g 11   diphenhydrAMINE  (BENADRYL  ALLERGY) 25 mg capsule Patient to take 2 tablets one hour prior to test 1 capsule 0   hydrocortisone  2.5 % cream Apply topically 2 (two) times daily. 40 g 1   IRON PO Take 65 mg by mouth daily.     Multiple Vitamin (MULTIVITAMIN) tablet Take 1 tablet by mouth daily.     terazosin  (HYTRIN ) 10 MG capsule Take 1 capsule (10 mg total) by mouth at bedtime. 90 capsule 3   triamcinolone  cream (KENALOG ) 0.5 % APPLY  CREAM TOPICALLY THREE TIMES DAILY 45 g 0   famotidine  (PEPCID ) 40 MG tablet Take 1 tablet (40 mg total) by mouth daily. (Patient not taking: Reported on 05/29/2023) 90 tablet 1   furosemide  (LASIX ) 20 MG tablet Take 1 tablet (20 mg total) by mouth daily as needed. FOR SWELLING 30 tablet 6   pantoprazole  (PROTONIX ) 40 MG tablet Take 1 tablet (40 mg total) by mouth daily. (Patient not taking: Reported on 05/29/2023) 90 tablet 3   No facility-administered medications prior to visit.    ROS: Review of Systems  Constitutional:  Positive for fatigue. Negative for unexpected weight change.  HENT:  Positive for congestion, postnasal drip, sore throat and voice change.   Respiratory:  Negative  for cough and shortness of breath.   Cardiovascular:  Negative for palpitations.  Gastrointestinal:  Negative for diarrhea.  Musculoskeletal:  Negative for back pain.  Psychiatric/Behavioral:  Negative for suicidal ideas. The patient is not nervous/anxious.     Objective:  BP (!) 122/48   Pulse 68   Temp 98.8 F (37.1 C)   Ht 5\' 10"  (1.778 m)   Wt 181 lb (82.1 kg)   SpO2 94%   BMI 25.97 kg/m   BP Readings from Last 3 Encounters:  05/29/23 (!) 122/48  10/17/22 130/64  07/06/22 (!) 148/86    Wt Readings from Last 3 Encounters:  05/29/23 181 lb (82.1 kg)  10/17/22 184 lb (83.5 kg)  10/08/22 178 lb (80.7 kg)    Physical Exam Constitutional:      General: He is not in acute distress.    Appearance: He is well-developed.     Comments: NAD  Eyes:     Conjunctiva/sclera: Conjunctivae normal.     Pupils: Pupils are equal, round, and reactive to light.  Neck:     Thyroid : No thyromegaly.     Vascular: No JVD.  Cardiovascular:     Rate and Rhythm: Normal rate and regular rhythm.     Heart sounds: Normal heart sounds. No murmur heard.    No friction rub. No gallop.  Pulmonary:  Effort: Pulmonary effort is normal. No respiratory distress.     Breath sounds: Normal breath sounds. No wheezing or rales.  Chest:     Chest wall: No tenderness.  Abdominal:     General: Bowel sounds are normal. There is no distension.     Palpations: Abdomen is soft. There is no mass.     Tenderness: There is no abdominal tenderness. There is no guarding or rebound.  Musculoskeletal:        General: No tenderness. Normal range of motion.     Cervical back: Normal range of motion.  Lymphadenopathy:     Cervical: No cervical adenopathy.  Skin:    General: Skin is warm and dry.     Findings: No rash.  Neurological:     Mental Status: He is alert and oriented to person, place, and time.     Cranial Nerves: No cranial nerve deficit.     Motor: No abnormal muscle tone.     Coordination:  Coordination normal.     Gait: Gait normal.     Deep Tendon Reflexes: Reflexes are normal and symmetric.  Psychiatric:        Behavior: Behavior normal.        Thought Content: Thought content normal.        Judgment: Judgment normal.   Raspy voice quality  Lab Results  Component Value Date   WBC 7.9 05/30/2023   HGB 12.3 (L) 05/30/2023   HCT 38.3 (L) 05/30/2023   PLT 228.0 05/30/2023   GLUCOSE 104 (H) 05/30/2023   CHOL 121 05/30/2023   TRIG 82.0 05/30/2023   HDL 32.60 (L) 05/30/2023   LDLCALC 72 05/30/2023   ALT 10 05/30/2023   AST 12 05/30/2023   NA 140 05/30/2023   K 4.1 05/30/2023   CL 106 05/30/2023   CREATININE 0.99 05/30/2023   BUN 15 05/30/2023   CO2 25 05/30/2023   TSH 1.14 07/09/2022   PSA 0.01 (L) 05/30/2023   INR 0.99 10/25/2016    CT Abdomen Pelvis W Contrast Result Date: 03/15/2021 CLINICAL DATA:  Two month history of low abdominal pain. EXAM: CT ABDOMEN AND PELVIS WITH CONTRAST TECHNIQUE: Multidetector CT imaging of the abdomen and pelvis was performed using the standard protocol following bolus administration of intravenous contrast. RADIATION DOSE REDUCTION: This exam was performed according to the departmental dose-optimization program which includes automated exposure control, adjustment of the mA and/or kV according to patient size and/or use of iterative reconstruction technique. CONTRAST:  OMNIPAQUE  IOHEXOL  300 MG/ML  SOLN COMPARISON:  08/10/2014 FINDINGS: Lower chest: No acute abnormality. Hepatobiliary: Right lobe of liver cyst measures 1.3 cm. Small low attenuation structure within the periphery of the right hepatic lobe measures 4 mm, image 16/2. This is technically too small to reliably characterize. No suspicious liver lesions identified at this time. Gallbladder appears normal. No bile duct dilatation. Pancreas: Unremarkable. No pancreatic ductal dilatation or surrounding inflammatory changes. Spleen: Normal in size without focal abnormality.  Adrenals/Urinary Tract: Normal adrenal glands. There is a cyst arising off the posterior cortex of the interpolar left kidney measuring 4.1 cm. There is a focal area of cortical scarring within the inferior pole of the left kidney, image 29/2. There are 2 stones within the inferior pole collecting system of the left kidney which measure up to 3 mm. No hydronephrosis or hydroureter identified bilaterally. Urinary bladder is unremarkable. Stomach/Bowel: The distal esophagus appears markedly dilated compatible with clinical history of achalasia, image 1/2. Stomach appears normal. The  appendix is visualized and is within normal limits. Scattered small distal colonic diverticula noted without signs of acute inflammation. Vascular/Lymphatic: Aortic atherosclerosis. No aneurysm. No abdominopelvic adenopathy. Reproductive: Seed implants identified within the prostate gland. Other: No free fluid or fluid collections. There is a right inguinal hernia which contains fat and a nonobstructed loop of small bowel, image 74/2. Musculoskeletal: There is lumbar spondylosis. First degree anterolisthesis of L4 on L5 noted. IMPRESSION: 1. No acute findings within the abdomen or pelvis. 2. Right inguinal hernia contains fat and a nonobstructed loop of small bowel. 3. Markedly dilated distal esophagus compatible with clinical history of achalasia. 4. Nonobstructing left renal calculi. 5. Aortic Atherosclerosis (ICD10-I70.0). Electronically Signed   By: Kimberley Penman M.D.   On: 03/15/2021 08:30    Assessment & Plan:   Problem List Items Addressed This Visit     Essential hypertension   On Amlodipine , Terazosyn      Esophageal achalasia   F/u w/Dr Elvin Hammer Consider botox injections On Pantoprazole  and Famotidine       Relevant Orders   Ambulatory referral to Gastroenterology   Hoarseness - Primary   New ENT ref      Relevant Orders   Ambulatory referral to ENT   Sore throat   ST, sputum, hoarseness and raspy voice x  3 months ENT ref       Relevant Orders   Ambulatory referral to ENT      No orders of the defined types were placed in this encounter.     Follow-up: Return in about 6 months (around 11/28/2023) for a follow-up visit.  Anitra Barn, MD

## 2023-05-29 NOTE — Assessment & Plan Note (Signed)
On Amlodipine, Terazosyn

## 2023-05-29 NOTE — Assessment & Plan Note (Signed)
 ST, sputum, hoarseness and raspy voice x 3 months ENT ref

## 2023-05-30 ENCOUNTER — Encounter: Payer: Self-pay | Admitting: Internal Medicine

## 2023-05-30 ENCOUNTER — Other Ambulatory Visit (INDEPENDENT_AMBULATORY_CARE_PROVIDER_SITE_OTHER)

## 2023-05-30 DIAGNOSIS — E785 Hyperlipidemia, unspecified: Secondary | ICD-10-CM | POA: Diagnosis not present

## 2023-05-30 DIAGNOSIS — I10 Essential (primary) hypertension: Secondary | ICD-10-CM

## 2023-05-30 DIAGNOSIS — R634 Abnormal weight loss: Secondary | ICD-10-CM | POA: Diagnosis not present

## 2023-05-30 DIAGNOSIS — C61 Malignant neoplasm of prostate: Secondary | ICD-10-CM

## 2023-05-30 DIAGNOSIS — N410 Acute prostatitis: Secondary | ICD-10-CM | POA: Diagnosis not present

## 2023-05-30 LAB — LIPID PANEL
Cholesterol: 121 mg/dL (ref 0–200)
HDL: 32.6 mg/dL — ABNORMAL LOW (ref >39.00)
LDL Cholesterol: 72 mg/dL (ref 0–99)
NonHDL: 88.61
Total CHOL/HDL Ratio: 4
Triglycerides: 82 mg/dL (ref 0.0–149.0)
VLDL: 16.4 mg/dL (ref 0.0–40.0)

## 2023-05-30 LAB — COMPREHENSIVE METABOLIC PANEL WITH GFR
ALT: 10 U/L (ref 0–53)
AST: 12 U/L (ref 0–37)
Albumin: 3.4 g/dL — ABNORMAL LOW (ref 3.5–5.2)
Alkaline Phosphatase: 73 U/L (ref 39–117)
BUN: 15 mg/dL (ref 6–23)
CO2: 25 meq/L (ref 19–32)
Calcium: 8.9 mg/dL (ref 8.4–10.5)
Chloride: 106 meq/L (ref 96–112)
Creatinine, Ser: 0.99 mg/dL (ref 0.40–1.50)
GFR: 71.61 mL/min (ref >60.00)
Glucose, Bld: 104 mg/dL — ABNORMAL HIGH (ref 70–99)
Potassium: 4.1 meq/L (ref 3.5–5.1)
Sodium: 140 meq/L (ref 135–145)
Total Bilirubin: 0.6 mg/dL (ref 0.2–1.2)
Total Protein: 6.5 g/dL (ref 6.0–8.3)

## 2023-05-30 LAB — CBC WITH DIFFERENTIAL/PLATELET
Basophils Absolute: 0 10*3/uL (ref 0.0–0.1)
Basophils Relative: 0.6 % (ref 0.0–3.0)
Eosinophils Absolute: 0.3 10*3/uL (ref 0.0–0.7)
Eosinophils Relative: 3.6 % (ref 0.0–5.0)
HCT: 38.3 % — ABNORMAL LOW (ref 39.0–52.0)
Hemoglobin: 12.3 g/dL — ABNORMAL LOW (ref 13.0–17.0)
Lymphocytes Relative: 28 % (ref 12.0–46.0)
Lymphs Abs: 2.2 10*3/uL (ref 0.7–4.0)
MCHC: 32 g/dL (ref 30.0–36.0)
MCV: 80.1 fl (ref 78.0–100.0)
Monocytes Absolute: 0.7 10*3/uL (ref 0.1–1.0)
Monocytes Relative: 8.5 % (ref 3.0–12.0)
Neutro Abs: 4.7 10*3/uL (ref 1.4–7.7)
Neutrophils Relative %: 59.3 % (ref 43.0–77.0)
Platelets: 228 10*3/uL (ref 150.0–400.0)
RBC: 4.78 Mil/uL (ref 4.22–5.81)
RDW: 14.6 % (ref 11.5–15.5)
WBC: 7.9 10*3/uL (ref 4.0–10.5)

## 2023-05-30 LAB — T4, FREE: Free T4: 0.96 ng/dL (ref 0.60–1.60)

## 2023-05-30 LAB — PSA: PSA: 0.01 ng/mL — ABNORMAL LOW (ref 0.10–4.00)

## 2023-05-30 LAB — SEDIMENTATION RATE: Sed Rate: 31 mm/h — ABNORMAL HIGH (ref 0–20)

## 2023-06-06 ENCOUNTER — Encounter (INDEPENDENT_AMBULATORY_CARE_PROVIDER_SITE_OTHER): Payer: Self-pay | Admitting: Otolaryngology

## 2023-06-29 ENCOUNTER — Other Ambulatory Visit: Payer: Self-pay | Admitting: Internal Medicine

## 2023-07-15 DIAGNOSIS — F4323 Adjustment disorder with mixed anxiety and depressed mood: Secondary | ICD-10-CM | POA: Diagnosis not present

## 2023-07-25 DIAGNOSIS — F4323 Adjustment disorder with mixed anxiety and depressed mood: Secondary | ICD-10-CM | POA: Diagnosis not present

## 2023-07-29 DIAGNOSIS — F4323 Adjustment disorder with mixed anxiety and depressed mood: Secondary | ICD-10-CM | POA: Diagnosis not present

## 2023-08-05 DIAGNOSIS — F4323 Adjustment disorder with mixed anxiety and depressed mood: Secondary | ICD-10-CM | POA: Diagnosis not present

## 2023-08-13 ENCOUNTER — Institutional Professional Consult (permissible substitution) (INDEPENDENT_AMBULATORY_CARE_PROVIDER_SITE_OTHER): Admitting: Otolaryngology

## 2023-08-13 ENCOUNTER — Telehealth (INDEPENDENT_AMBULATORY_CARE_PROVIDER_SITE_OTHER): Payer: Self-pay | Admitting: Otolaryngology

## 2023-08-13 NOTE — Telephone Encounter (Signed)
 LVM to reschedule appointment [phone] due to Dr Okey not being in office.

## 2023-08-16 ENCOUNTER — Other Ambulatory Visit: Payer: Self-pay | Admitting: Internal Medicine

## 2023-08-16 NOTE — Telephone Encounter (Signed)
 Copied from CRM [id number]. Topic: Clinical - Medication Refill >> Aug 16, 2023 11:08 AM Berneda FALCON wrote: Medication: pantoprazole  (PROTONIX ) 40 MG tablet  Has the patient contacted their pharmacy? Yes (Agent: If no, request that the patient contact the pharmacy for the refill. If patient does not wish to contact the pharmacy document the reason why and proceed with request.) (Agent: If yes, when and what did the pharmacy advise?)  This is the patient's preferred pharmacy:   [facility] - [place], [place] - [address] [place] [address] Phone: [phone] Fax: [phone]   Is this the correct pharmacy for this prescription? Yes If no, delete pharmacy and type the correct one.   Has the prescription been filled recently? No  Is the patient out of the medication? Yes  Has the patient been seen for an appointment in the last year OR does the patient have an upcoming appointment? Yes  Can we respond through MyChart? Yes  Agent: Please be advised that Rx refills may take up to 3 business days. We ask that you follow-up with your pharmacy.

## 2023-08-26 DIAGNOSIS — F4323 Adjustment disorder with mixed anxiety and depressed mood: Secondary | ICD-10-CM | POA: Diagnosis not present

## 2023-09-09 ENCOUNTER — Ambulatory Visit: Admitting: Physician Assistant

## 2023-09-12 ENCOUNTER — Encounter (INDEPENDENT_AMBULATORY_CARE_PROVIDER_SITE_OTHER): Payer: Self-pay | Admitting: Otolaryngology

## 2023-09-12 ENCOUNTER — Ambulatory Visit (INDEPENDENT_AMBULATORY_CARE_PROVIDER_SITE_OTHER): Admitting: Otolaryngology

## 2023-09-12 VITALS — BP 150/66 | HR 74

## 2023-09-12 DIAGNOSIS — K219 Gastro-esophageal reflux disease without esophagitis: Secondary | ICD-10-CM

## 2023-09-12 DIAGNOSIS — R490 Dysphonia: Secondary | ICD-10-CM | POA: Diagnosis not present

## 2023-09-12 DIAGNOSIS — J383 Other diseases of vocal cords: Secondary | ICD-10-CM | POA: Diagnosis not present

## 2023-09-12 DIAGNOSIS — R0981 Nasal congestion: Secondary | ICD-10-CM

## 2023-09-12 DIAGNOSIS — K220 Achalasia of cardia: Secondary | ICD-10-CM

## 2023-09-12 DIAGNOSIS — R0982 Postnasal drip: Secondary | ICD-10-CM | POA: Diagnosis not present

## 2023-09-12 MED ORDER — FLUTICASONE PROPIONATE 50 MCG/ACT NA SUSP: 2.0000 | Freq: Every day | NASAL | 6 refills | Status: AC

## 2023-09-12 NOTE — Progress Notes (Signed)
 ENT CONSULT:  Reason for Consult: hoarseness    HPI: Discussed the use of AI scribe software for clinical note transcription with the patient, who gave verbal consent to proceed.  History of Present Illness Dominic Shaffer is an 83 year old male with achalasia/intermittent dysphagia who presents with changes in his voice, intermittent hoarseness for several months.   He experiences intermittent changes in his voice, describing it as hoarseness that comes and goes without a specific pattern throughout the day. No pain is associated with swallowing, and he has not previously undergone voice therapy.  He has a history of achalasia, which periodically causes difficulty with swallowing. He experiences episodes of reflux and is currently taking pantoprazole , though he could not recall the exact dosage. No pain with swallowing or dyspnea.   He has a past history of smoking, having smoked during his first year of college in 1962, but has not smoked since then.   Records Reviewed:  Office visit 05/29/23 Dr Garald ore Throat (Sore throat, hoarse, sometimes feels as though there is sputum. Patient had been diagnosed with swallowing disorder in the past. Has history of acid reflux issues as well as seasonal pollen)     HPI Dominic Shaffer presents for ST, sputum, hoarseness and raspy voice x 3 months,   Hoarseness - Primary     New ENT ref       Past Medical History:  Diagnosis Date   Asymptomatic stenosis of left carotid artery without infarction    per duplex LICA 40-59% (07-11-2011)   At risk for sleep apnea    STOP-BANG= 4     SENT TO PCP 12-23-2013   BPH with urinary obstruction    hyperplasia   CAD (coronary artery disease) 03/10/2018   Dr Delford     Low risk cardiac CT 2012:        1)    Calcium Score 26 isolated area in mid LAD          2)    <50% stenosis in mid LAD involving ostium of D1  3)    Distal circumflex and PDA not well seen  Large right  sided hiatal hernia with basilar atelectasis  Original Report Authenticated By: WADDELL HILARIO CALK, M.D.     Take Fish oil     Colon polyps    Congenital renal cyst, single    Coronary artery disease    cardiologist--  dr delford-- myoview  07-05-2010 poss. small inferior wall infact/  no ischemia/ ef 51%/  cardic CT score 28 and <50% LAD/D! disease   Depression    Diverticulitis    ED (erectile dysfunction)    secondary arterial insuffiency   Esophageal achalasia    s/p multiple dilatations and botox injection tx and surgical intervention 06-17-2011  Heller Myotomy   Hematospermia    History of DVT of lower extremity    06/ 2012   History of esophageal dilatation    and botox injection tx's at [facility]   Hypertension    Internal hemorrhoid 2003   Dr. Abran   LBBB (left bundle branch block)    LBP (low back pain)    Nocturia    Prostate cancer [facility])    Wears glasses     Past Surgical History:  Procedure Laterality Date   CARDIOVASCULAR STRESS TEST  07-05-2010  dr delford   Adenosine  nuclear study/  no significant ST segment change suggestive of ischemia/  possible small inferior wall infarct at mid and  basal level/ ef 51%/  LBBB/  normal wall motion   COLONOSCOPY  last one 2009   INGUINAL HERNIA REPAIR Left 10/13/2012   Procedure: OPEN LEFT INGUINAL HERNIA REPAIR WITH ON Q PUMP;  Surgeon: Krystal JINNY Russell, MD;  Location: [facility];  Service: General;  Laterality: Left;   INSERTION OF MESH Left 10/13/2012   Procedure: INSERTION OF MESH;  Surgeon: Krystal JINNY Russell, MD;  Location: [facility];  Service: General;  Laterality: Left;   IR RADIOLOGIST EVAL & MGMT  10/11/2016   LAPAROSCOPIC HELLER MYOTOMY  06-16-2001   w/ intraoperative upper endoscopy guidence (for achalasia)   RADIOACTIVE SEED IMPLANT N/A 12/31/2013   Procedure: RADIOACTIVE SEED IMPLANT;  Surgeon: Norleen JINNY Seltzer, MD;  Location: [facility];  Service: Urology;  Laterality: N/A;  seeds implanted 78 no seeds found in bladder    TRANSTHORACIC ECHOCARDIOGRAM  07-05-2010   mild LVH/ septal motion consistent w/ LBBB/ ef 45-50%/  diffuse hypokinesis/  grade I diastolic dysfunction/ mild LAE/  trivial TR   UMBILICAL HERNIA REPAIR  11-11-2005    Family History  Problem Relation Age of Onset   Heart disease Mother 73       CAD   Kidney disease Mother    Cancer Father 16       bladder ca; passed in 1999   Bladder Cancer Father    Hypertension Other    Colon cancer Neg Hx    Esophageal cancer Neg Hx    Prostate cancer Neg Hx    Pancreatic cancer Neg Hx    Stomach cancer Neg Hx     Social History:  reports that he quit smoking about 58 years ago. His smoking use included cigarettes. He started smoking about 58 years ago. He has a 0.1 pack-year smoking history. He has never used smokeless tobacco. He reports that he does not drink alcohol and does not use drugs.  Allergies:  Allergies  Allergen Reactions   Iodinated Contrast Media Hives   Shellfish Allergy Hives    MAINLY -SHRIMP    Medications: I have reviewed the patient's current medications.  The PMH, PSH, Medications, Allergies, and SH were reviewed and updated.  ROS: Constitutional: Negative for fever, weight loss and weight gain. Cardiovascular: Negative for chest pain and dyspnea on exertion. Respiratory: Is not experiencing shortness of breath at rest. Gastrointestinal: Negative for nausea and vomiting. Neurological: Negative for headaches. Psychiatric: The patient is not nervous/anxious  Blood pressure (!) 150/66, pulse 74, SpO2 94%. There is no height or weight on file to calculate BMI.  PHYSICAL EXAM:  Exam: General: Well-developed, well-nourished Communication and Voice: raspy Respiratory Respiratory effort: Equal inspiration and expiration without stridor Cardiovascular Peripheral Vascular: Warm extremities with equal color/perfusion Eyes: No nystagmus with equal extraocular motion bilaterally Neuro/Psych/Balance: Patient oriented  to person, place, and time; Appropriate mood and affect; Gait is intact with no imbalance; Cranial nerves I-XII are intact Head and Face Inspection: Normocephalic and atraumatic without mass or lesion Palpation: Facial skeleton intact without bony stepoffs Salivary Glands: No mass or tenderness Facial Strength: Facial motility symmetric and full bilaterally ENT Pinna: External ear intact and fully developed External canal: Canal is patent with intact skin Tympanic Membrane: Clear and mobile External Nose: No scar or anatomic deformity Internal Nose: Septum is deviated to the left. No polyp, or purulence. Mucosal edema and erythema present.  Bilateral inferior turbinate hypertrophy.  Lips, Teeth, and gums: Mucosa and teeth intact and viable TMJ: No pain to palpation with  full mobility Oral cavity/oropharynx: No erythema or exudate, no lesions present Nasopharynx: No mass or lesion with intact mucosa Hypopharynx: Intact mucosa without pooling of secretions Larynx Glottic: Full true vocal cord mobility without lesion or mass VF atrophy supraglottic compression mucosal wave intact Supraglottic: Normal appearing epiglottis and AE folds Interarytenoid Space: Moderate pachydermia&edema Subglottic Space: Patent without lesion or edema Neck Neck and Trachea: Midline trachea without mass or lesion Thyroid : No mass or nodularity Lymphatics: No lymphadenopathy  Procedure:  Preoperative diagnosis: hoarseness  Postoperative diagnosis:   same + VF atrophy glottic insufficiency post-nasal drainage and GERD LPR  Procedure: Flexible fiberoptic laryngoscopy with stroboscopy (68420)   Surgeon: Elena Larry, MD  Anesthesia: Topical lidocaine  and Afrin  Complications: None  Condition is stable throughout exam  Indications and consent:   The patient presents to the clinic with hoarseness. All the risks, benefits, and potential complications were reviewed with the patient preoperatively and  informed verbal consent was obtained.  Procedure: The patient was seated upright in the exam chair.   Topical lidocaine  and Afrin were applied to the nasal cavity. After adequate anesthesia had occurred, the flexible telescope with strobe capabilities was passed into the nasal cavity. The nasopharynx was patent without mass or lesion. The scope was passed behind the soft palate and directed toward the base of tongue. The base of tongue was visualized and was symmetric with no apparent masses or abnormal appearing tissue. There were no signs of a mass or pooling of secretions in the piriform sinuses. The supraglottic structures were normal.  The true vocal cords are mobile. The medial edges were bowed. Closure was incomplete and there was supraglottic compression. Periodicity present. The mucosal wave and amplitude were intact and normal symmetric. There is moderate interarytenoid pachydermia and post cricoid edema. The mucosa appears without lesions.   The laryngoscope was then slowly withdrawn and the patient tolerated the procedure well. There were no complications or blood loss.    Studies Reviewed: CT A/P 03/14/21 IMPRESSION: 1. No acute findings within the abdomen or pelvis. 2. Right inguinal hernia contains fat and a nonobstructed loop of small bowel. 3. Markedly dilated distal esophagus compatible with clinical history of achalasia. 4. Nonobstructing left renal calculi. 5. Aortic Atherosclerosis (ICD10-I70.0).  Assessment/Plan: Encounter Diagnoses  Name Primary?   Glottic insufficiency Yes   Dysphonia    Hoarseness    Age-related vocal fold atrophy    Chronic GERD    Esophageal achalasia     Assessment and Plan Assessment & Plan Presbyphonia (age-related thin vocal cords) Chronic hoarseness Age-related vocal fold atrophy and supraglottic compression without lesions or masses and intact mucosal wave on strobe exam today. Speech therapy is first-line treatment. - Refer to speech  therapy for voice therapy - Consider procedural interventions such as vocal cord filler injection augmentation or thyroplasty if speech therapy is ineffective.  Chronic nasal congestion and Postnasal drainage Environmental allergies  Postnasal drainage may affect voice quality, also noted on scope exam No prior nasal spray use. - Prescribed Flonase  nasal spray twice daily. - Recommend nasal saline rinses with distilled water .  Gastroesophageal reflux disease LPR Evidence of GERD LPR on scope exam. May contribute to voice quality.  - Continue Pantoprazole   -  Reflux Gourmet after meals - diet and lifestyle changes to minimize GERD - Refer to [employer] blog for dietary and lifestyle modifications/reflux cook book   Thank you for allowing me to participate in the care of this patient. Please do not hesitate to contact me  with any questions or concerns.   Elena Larry, MD Otolaryngology [facility] Phone: [phone] Fax: [phone]    09/12/2023, 9:15 AM

## 2023-09-12 NOTE — Patient Instructions (Addendum)
 [facility] Med Nasal Saline Rinse   - start nasal saline rinses with NeilMed Bottle available over the counter or online to help with nasal congestion      [email or web address] - check out this website to learn more about reflux   -Avoid lying down for at least two hours after a meal or after drinking acidic beverages, like soda, or other caffeinated beverages. This can help to prevent stomach contents from flowing back into the esophagus. -Keep your head elevated while you sleep. Using an extra pillow or two can also help to prevent reflux. -Eat smaller and more frequent meals each day instead of a few large meals. This promotes digestion and can aid in preventing heartburn. -Wear loose-fitting clothes to ease pressure on the stomach, which can worsen heartburn and reflux. -Reduce excess weight around the midsection. This can ease pressure on the stomach. Such pressure can force some stomach contents back up the esophagus

## 2023-09-19 DIAGNOSIS — F4323 Adjustment disorder with mixed anxiety and depressed mood: Secondary | ICD-10-CM | POA: Diagnosis not present

## 2023-10-09 ENCOUNTER — Ambulatory Visit: Payer: Medicare HMO

## 2023-10-09 VITALS — Ht 70.0 in | Wt 180.0 lb

## 2023-10-09 DIAGNOSIS — Z0000 Encounter for general adult medical examination without abnormal findings: Secondary | ICD-10-CM | POA: Diagnosis not present

## 2023-10-09 NOTE — Patient Instructions (Addendum)
 Dominic Shaffer,  Thank you for taking the time for your Medicare Wellness Visit. I appreciate your continued commitment to your health goals. Please review the care plan we discussed, and feel free to reach out if I can assist you further.  Medicare recommends these wellness visits once per year to help you and your care team stay ahead of potential health issues. These visits are designed to focus on prevention, allowing your provider to concentrate on managing your acute and chronic conditions during your regular appointments.  Please note that Annual Wellness Visits do not include a physical exam. Some assessments may be limited, especially if the visit was conducted virtually. If needed, we may recommend a separate in-person follow-up with your provider.  Ongoing Care Seeing your primary care provider every 3 to 6 months helps us  monitor your health and provide consistent, personalized care.   Referrals If a referral was made during today's visit and you haven't received any updates within two weeks, please contact the referred provider directly to check on the status.  Recommended Screenings:  Health Maintenance  Topic Date Due   DTaP/Tdap/Td vaccine (1 - Tdap) Never done   Pneumococcal Vaccine for age over 69 (1 of 2 - PCV) Never done   Zoster (Shingles) Vaccine (2 of 2) 11/19/2019   Flu Shot  08/30/2023   Medicare Annual Wellness Visit  10/08/2024   HPV Vaccine  Aged Out   Meningitis B Vaccine  Aged Out   COVID-19 Vaccine  Discontinued       10/09/2023    8:56 AM  Advanced Directives  Does Patient Have a Medical Advance Directive? Yes  Type of Estate agent of [place];Living will  Copy of Healthcare Power of Attorney in Chart? No - copy requested   Advance Care Planning is important because it: Ensures you receive medical care that aligns with your values, goals, and preferences. Provides guidance to your family and loved ones, reducing the emotional  burden of decision-making during critical moments.  Vision: Annual vision screenings are recommended for early detection of glaucoma, cataracts, and diabetic retinopathy. These exams can also reveal signs of chronic conditions such as diabetes and high blood pressure.  Dental: Annual dental screenings help detect early signs of oral cancer, gum disease, and other conditions linked to overall health, including heart disease and diabetes.

## 2023-10-09 NOTE — Progress Notes (Addendum)
 Subjective:   Dominic Shaffer is a 83 y.o. who presents for a Medicare Wellness preventive visit.  As a reminder, Annual Wellness Visits don't include a physical exam, and some assessments may be limited, especially if this visit is performed virtually. We may recommend an in-person follow-up visit with your provider if needed.  Visit Complete: Virtual I connected with  Dominic Shaffer on 10/17/23 by a audio enabled telemedicine application and verified that I am speaking with the correct person using two identifiers.  Patient Location: Home  Provider Location: Office/Clinic  I discussed the limitations of evaluation and management by telemedicine. The patient expressed understanding and agreed to proceed.  Vital Signs: Because this visit was a virtual/telehealth visit, some criteria may be missing or patient reported. Any vitals not documented were not able to be obtained and vitals that have been documented are patient reported.  VideoDeclined- This patient declined Librarian, academic. Therefore the visit was completed with audio only.  Persons Participating in Visit: Patient.  AWV Questionnaire: No: Patient Medicare AWV questionnaire was not completed prior to this visit.  Cardiac Risk Factors include: advanced age (>50men, >35 women);hypertension;male gender     Objective:    Today's Vitals   10/09/23 0855  Weight: 180 lb (81.6 kg)  Height: 5' 10 (1.778 m)   Body mass index is 25.83 kg/m.     10/09/2023    8:56 AM 10/08/2022    9:34 AM 02/20/2021    9:04 AM 09/24/2019    2:04 PM 09/07/2015   11:37 AM 07/30/2015    6:41 PM 07/30/2015    3:57 PM  Advanced Directives  Does Patient Have a Medical Advance Directive? Yes Yes Yes Yes Yes  No;Yes  No   Type of Estate agent of [place];Living will Healthcare Power of [place];Living will Healthcare Power of [place];Living will Healthcare Power of [place];Living will  Healthcare Power of [place];Living will  Healthcare Power of [place];Living will    Does patient want to make changes to medical advance directive?    No - Patient declined No - Patient declined  No - Patient declined    Copy of Healthcare Power of Attorney in Chart? No - copy requested No - copy requested No - copy requested No - copy requested No - copy requested  No - copy requested       Data saved with a previous flowsheet row definition    Current Medications (verified) Outpatient Encounter Medications as of 10/09/2023  Medication Sig   amLODipine  (NORVASC ) 5 MG tablet Take 1 tablet (5 mg total) by mouth daily.   apixaban  (ELIQUIS ) 5 MG TABS tablet Take 1 tablet (5 mg total) by mouth 2 (two) times daily.   diclofenac  Sodium (VOLTAREN ) 1 % GEL Apply 2 g topically 4 (four) times daily. To affected joint.   diphenhydrAMINE  (BENADRYL  ALLERGY) 25 mg capsule Patient to take 2 tablets one hour prior to test   fluticasone  (FLONASE ) 50 MCG/ACT nasal spray Place 2 sprays into both nostrils daily.   hydrocortisone  2.5 % cream Apply topically 2 (two) times daily.   IRON PO Take 65 mg by mouth daily.   Multiple Vitamin (MULTIVITAMIN) tablet Take 1 tablet by mouth daily.   pantoprazole  (PROTONIX ) 40 MG tablet TAKE 1 TABLET EVERY DAY   terazosin  (HYTRIN ) 10 MG capsule Take 1 capsule (10 mg total) by mouth at bedtime.   triamcinolone  cream (KENALOG ) 0.5 % APPLY  CREAM TOPICALLY THREE TIMES DAILY  famotidine  (PEPCID ) 40 MG tablet Take 1 tablet (40 mg total) by mouth daily. (Patient not taking: Reported on 10/09/2023)   furosemide  (LASIX ) 20 MG tablet Take 1 tablet (20 mg total) by mouth daily as needed. FOR SWELLING (Patient not taking: Reported on 10/09/2023)   No facility-administered encounter medications on file as of 10/09/2023.    Allergies (verified) Iodinated contrast media and Shellfish allergy   History: Past Medical History:  Diagnosis Date   Asymptomatic stenosis of left carotid  artery without infarction    per duplex LICA 40-59% (07-11-2011)   At risk for sleep apnea    STOP-BANG= 4     SENT TO PCP 12-23-2013   BPH with urinary obstruction    hyperplasia   CAD (coronary artery disease) 03/10/2018   Dr Delford     Low risk cardiac CT 2012:        1)    Calcium Score 26 isolated area in mid LAD          2)    <50% stenosis in mid LAD involving ostium of D1  3)    Distal circumflex and PDA not well seen  Large right sided hiatal hernia with basilar atelectasis  Original Report Authenticated By: WADDELL HILARIO CALK, M.D.     Take Fish oil     Colon polyps    Congenital renal cyst, single    Coronary artery disease    cardiologist--  dr delford-- myoview  07-05-2010 poss. small inferior wall infact/  no ischemia/ ef 51%/  cardic CT score 28 and <50% LAD/D! disease   Depression    Diverticulitis    ED (erectile dysfunction)    secondary arterial insuffiency   Esophageal achalasia    s/p multiple dilatations and botox injection tx and surgical intervention 06-17-2011  Heller Myotomy   Hematospermia    History of DVT of lower extremity    06/ 2012   History of esophageal dilatation    and botox injection tx's at [facility]   Hypertension    Internal hemorrhoid 2003   Dr. Abran   LBBB (left bundle branch block)    LBP (low back pain)    Nocturia    Prostate cancer [facility])    Wears glasses    Past Surgical History:  Procedure Laterality Date   CARDIOVASCULAR STRESS TEST  07-05-2010  dr delford   Adenosine  nuclear study/  no significant ST segment change suggestive of ischemia/  possible small inferior wall infarct at mid and basal level/ ef 51%/  LBBB/  normal wall motion   COLONOSCOPY  last one 2009   INGUINAL HERNIA REPAIR Left 10/13/2012   Procedure: OPEN LEFT INGUINAL HERNIA REPAIR WITH ON Q PUMP;  Surgeon: Krystal JINNY Russell, MD;  Location: [facility];  Service: General;  Laterality: Left;   INSERTION OF MESH Left 10/13/2012   Procedure: INSERTION OF MESH;  Surgeon: Krystal JINNY Russell, MD;  Location: [facility];  Service: General;  Laterality: Left;   IR RADIOLOGIST EVAL & MGMT  10/11/2016   LAPAROSCOPIC HELLER MYOTOMY  06-16-2001   w/ intraoperative upper endoscopy [place] (for achalasia)   RADIOACTIVE SEED IMPLANT N/A 12/31/2013   Procedure: RADIOACTIVE SEED IMPLANT;  Surgeon: Norleen JINNY Seltzer, MD;  Location: [facility];  Service: Urology;  Laterality: N/A;  seeds implanted 78 no seeds found in bladder   TRANSTHORACIC ECHOCARDIOGRAM  07-05-2010   mild LVH/ septal motion consistent w/ LBBB/ ef 45-50%/  diffuse hypokinesis/  grade I diastolic dysfunction/  mild LAE/  trivial TR   UMBILICAL HERNIA REPAIR  11-11-2005   Family History  Problem Relation Age of Onset   Heart disease Mother 5       CAD   Kidney disease Mother    Cancer Father 35       bladder ca; passed in 1999   Bladder Cancer Father    Hypertension Other    Colon cancer Neg Hx    Esophageal cancer Neg Hx    Prostate cancer Neg Hx    Pancreatic cancer Neg Hx    Stomach cancer Neg Hx    Social History   Socioeconomic History   Marital status: Married    Spouse name: Not on file   Number of children: 3   Years of education: Not on file   Highest education level: Bachelor's degree (e.g., BA, AB, BS)  Occupational History   Occupation: HR  Tobacco Use   Smoking status: Former    Current packs/day: 0.00    Average packs/day: 0.3 packs/day for 0.5 years (0.1 ttl pk-yrs)    Types: Cigarettes    Start date: 01/02/1965    Quit date: 07/03/1965    Years since quitting: 58.3   Smokeless tobacco: Never  Vaping Use   Vaping status: Never Used  Substance and Sexual Activity   Alcohol use: No   Drug use: No   Sexual activity: Not Currently    Comment: one pack of cigarettes would last a week  Other Topics Concern   Not on file  Social History Narrative   Regular exercise - yes   Married   Social Drivers of Corporate investment banker Strain: Low Risk  (10/09/2023)   Overall  Financial Resource Strain (CARDIA)    Difficulty of Paying Living Expenses: Not hard at all  Food Insecurity: No Food Insecurity (10/09/2023)   Hunger Vital Sign    Worried About Running Out of Food in the Last Year: Never true    Ran Out of Food in the Last Year: Never true  Transportation Needs: No Transportation Needs (10/09/2023)   PRAPARE - Administrator, Civil Service (Medical): No    Lack of Transportation (Non-Medical): No  Physical Activity: Insufficiently Active (10/09/2023)   Exercise Vital Sign    Days of Exercise per Week: 2 days    Minutes of Exercise per Session: 20 min  Stress: No Stress Concern Present (10/09/2023)   [employer] of Occupational Health - Occupational Stress Questionnaire    Feeling of Stress: Not at all  Social Connections: Moderately Isolated (10/09/2023)   Social Connection and Isolation Panel    Frequency of Communication with Friends and Family: Once a week    Frequency of Social Gatherings with Friends and Family: Once a week    Attends Religious Services: More than 4 times per year    Active Member of [employer] or Organizations: No    Attends Engineer, structural: Never    Marital Status: Married    Tobacco Counseling Counseling given: No    Clinical Intake:  Pre-visit preparation completed: Yes  Pain : No/denies pain     BMI - recorded: 25.83 Nutritional Status: BMI 25 -29 Overweight Nutritional Risks: None Diabetes: No  No results found for: HGBA1C   How often do you need to have someone help you when you read instructions, pamphlets, or other written materials from your doctor or pharmacy?: 1 - Never  Interpreter Needed?: No  Information entered by ::  Verdie Saba, CMA   Activities of Daily Living     10/09/2023    8:58 AM  In your present state of health, do you have any difficulty performing the following activities:  Hearing? 0  Vision? 0  Difficulty concentrating or making decisions? 0   Walking or climbing stairs? 0  Dressing or bathing? 0  Doing errands, shopping? 0  Preparing Food and eating ? N  Using the Toilet? N  In the past six months, have you accidently leaked urine? N  Do you have problems with loss of bowel control? N  Managing your Medications? N  Managing your Finances? N  Housekeeping or managing your Housekeeping? N    Patient Care Team: Plotnikov, Karlynn GAILS, MD as PCP - General Luverne Aran, MD (Radiology) Watt Rush, MD (Urology) Abran Rush SAILOR, MD as Consulting Physician (Gastroenterology) Delford Maude BROCKS, MD as Consulting Physician (Cardiology) Cleotilde Sewer, OD (Optometry)  I have updated your Care Teams any recent Medical Services you may have received from other providers in the past year.     Assessment:   This is a routine wellness examination for Cornelio.  Hearing/Vision screen Hearing Screening - Comments:: Wears hearing aids Vision Screening - Comments:: Wears rx glasses - up to date with routine eye exams with Harlene Cleotilde   Goals Addressed               This Visit's Progress     Patient Stated (pt-stated)        Patient stated he plans to continue to maintain his diet and monitoring weight.  Plans to keep walking       Depression Screen     10/09/2023    8:59 AM 10/17/2022    3:29 PM 10/08/2022    9:35 AM 07/06/2022    3:42 PM 03/26/2022    1:16 PM 02/20/2021    9:04 AM 02/20/2021    9:02 AM  PHQ 2/9 Scores  PHQ - 2 Score 0 0 0 0 0 0 0  PHQ- 9 Score 0 0 0        Fall Risk     10/09/2023    8:59 AM 10/17/2022    3:29 PM 10/08/2022    9:35 AM 10/06/2022    2:22 PM 07/06/2022    3:42 PM  Fall Risk   Falls in the past year? 0 0 0 0 0  Number falls in past yr: 0 0 0  0  Injury with Fall? 0 0 0  0  Risk for fall due to : No Fall Risks No Fall Risks No Fall Risks  No Fall Risks  Follow up Falls evaluation completed;Falls prevention discussed Falls evaluation completed Falls prevention discussed  Falls evaluation  completed    MEDICARE RISK AT HOME:  Medicare Risk at Home Any stairs in or around the home?: Yes If so, are there any without handrails?: No Home free of loose throw rugs in walkways, pet beds, electrical cords, etc?: Yes Adequate lighting in your home to reduce risk of falls?: Yes Life alert?: No Use of a cane, walker or w/c?: No Grab bars in the bathroom?: Yes Shower chair or bench in shower?: Yes Elevated toilet seat or a handicapped toilet?: No  TIMED UP AND GO:  Was the test performed?  No  Cognitive Function: 6CIT completed        10/09/2023    9:02 AM 10/08/2022    9:42 AM  6CIT Screen  What Year? 0 points  0 points  What month? 0 points 0 points  What time? 0 points 0 points  Count back from 20 0 points 0 points  Months in reverse 0 points 0 points  Repeat phrase 0 points 0 points  Total Score 0 points 0 points    Immunizations Immunization History  Administered Date(s) Administered   Influenza-Unspecified 11/03/2015, 11/06/2017   Moderna Sars-Covid-2 Vaccination 02/15/2019, 03/10/2019, 11/23/2019, 05/21/2020, 11/07/2020   Zoster Recombinant(Shingrix) 09/24/2019    Screening Tests Health Maintenance  Topic Date Due   DTaP/Tdap/Td (1 - Tdap) Never done   Pneumococcal Vaccine: 50+ Years (1 of 2 - PCV) Never done   Zoster Vaccines- Shingrix (2 of 2) 11/19/2019   Influenza Vaccine  08/30/2023   Medicare Annual Wellness (AWV)  10/08/2024   HPV VACCINES  Aged Out   Meningococcal B Vaccine  Aged Out   COVID-19 Vaccine  Discontinued    Health Maintenance Items Addressed: 10/09/2023  Additional Screening:  Vision Screening: Recommended annual ophthalmology exams for early detection of glaucoma and other disorders of the eye. Is the patient up to date with their annual eye exam?  No  Who is the provider or what is the name of the office in which the patient attends annual eye exams? Harlene Pinal of [facility].  Pt plans to schedule an appt for an  eye exam in 2025.  Dental Screening: Recommended annual dental exams for proper oral hygiene  Community Resource Referral / Chronic Care Management: CRR required this visit?  No   CCM required this visit?  No   Plan:    I have personally reviewed and noted the following in the patient's chart:   Medical and social history Use of alcohol, tobacco or illicit drugs  Current medications and supplements including opioid prescriptions. Patient is not currently taking opioid prescriptions. Functional ability and status Nutritional status Physical activity Advanced directives List of other physicians Hospitalizations, surgeries, and ER visits in previous 12 months Vitals Screenings to include cognitive, depression, and falls Referrals and appointments  In addition, I have reviewed and discussed with patient certain preventive protocols, quality metrics, and best practice recommendations. A written personalized care plan for preventive services as well as general preventive health recommendations were provided to patient.   Verdie CHRISTELLA Saba, CMA   10/17/2023   After Visit Summary: (MyChart) Due to this being a telephonic visit, the after visit summary with patients personalized plan was offered to patient via MyChart   Notes: Scheduled a  1-yr Physical for 12/2023  Medical screening examination/treatment/procedure(s) were performed by non-physician practitioner and as supervising physician I was immediately available for consultation/collaboration.  I agree with above. Karlynn Noel, MD

## 2023-10-24 DIAGNOSIS — F4323 Adjustment disorder with mixed anxiety and depressed mood: Secondary | ICD-10-CM | POA: Diagnosis not present

## 2023-10-28 ENCOUNTER — Encounter: Payer: Self-pay | Admitting: Internal Medicine

## 2023-11-01 NOTE — Telephone Encounter (Signed)
 Copied from CRM [id number]. Topic: General - Other >> Nov 01, 2023 11:45 AM Robinson H wrote: Reason for CRM: Patient following up on message sent to provider via MyChart on 9/29, patient would like a callback, thanks.  Fields [phone]

## 2023-11-20 ENCOUNTER — Other Ambulatory Visit: Payer: Self-pay | Admitting: Internal Medicine

## 2023-11-20 NOTE — Telephone Encounter (Signed)
 Copied from CRM [id number]. Topic: Clinical - Medication Refill >> Nov 20, 2023 11:16 AM [place] J wrote: Medication: furosemide  (LASIX ) 20 MG tablet  Has the patient contacted their pharmacy? Yes (Agent: If no, request that the patient contact the pharmacy for the refill. If patient does not wish to contact the pharmacy document the reason why and proceed with request.) (Agent: If yes, when and what did the pharmacy advise?) Pharmacy called directly with request.   This is the patient's preferred pharmacy:  [facility] - [place], [place] - [address] [place] [address] Phone: [phone] Fax: [phone]  Is this the correct pharmacy for this prescription? Yes If no, delete pharmacy and type the correct one.   Has the prescription been filled recently? No  Is the patient out of the medication? No  Has the patient been seen for an appointment in the last year OR does the patient have an upcoming appointment? Yes  Can we respond through MyChart? Yes  Agent: Please be advised that Rx refills may take up to 3 business days. We ask that you follow-up with your pharmacy.

## 2023-11-25 ENCOUNTER — Other Ambulatory Visit: Payer: Self-pay | Admitting: Internal Medicine

## 2023-11-25 MED ORDER — FUROSEMIDE 20 MG PO TABS: 20.0000 mg | ORAL_TABLET | Freq: Every day | ORAL | 6 refills | Status: AC | PRN

## 2023-11-28 ENCOUNTER — Ambulatory Visit: Admitting: Internal Medicine

## 2023-12-17 ENCOUNTER — Ambulatory Visit (INDEPENDENT_AMBULATORY_CARE_PROVIDER_SITE_OTHER): Admitting: Otolaryngology

## 2023-12-30 ENCOUNTER — Encounter: Payer: Self-pay | Admitting: Internal Medicine

## 2023-12-30 ENCOUNTER — Ambulatory Visit

## 2023-12-30 ENCOUNTER — Ambulatory Visit: Admitting: Internal Medicine

## 2023-12-30 VITALS — BP 160/80 | HR 68 | Ht 70.0 in | Wt 174.0 lb

## 2023-12-30 DIAGNOSIS — F439 Reaction to severe stress, unspecified: Secondary | ICD-10-CM | POA: Diagnosis not present

## 2023-12-30 DIAGNOSIS — I82412 Acute embolism and thrombosis of left femoral vein: Secondary | ICD-10-CM

## 2023-12-30 DIAGNOSIS — C61 Malignant neoplasm of prostate: Secondary | ICD-10-CM

## 2023-12-30 DIAGNOSIS — N410 Acute prostatitis: Secondary | ICD-10-CM | POA: Diagnosis not present

## 2023-12-30 DIAGNOSIS — R634 Abnormal weight loss: Secondary | ICD-10-CM

## 2023-12-30 DIAGNOSIS — R490 Dysphonia: Secondary | ICD-10-CM | POA: Diagnosis not present

## 2023-12-30 DIAGNOSIS — I2510 Atherosclerotic heart disease of native coronary artery without angina pectoris: Secondary | ICD-10-CM | POA: Diagnosis not present

## 2023-12-30 DIAGNOSIS — I2583 Coronary atherosclerosis due to lipid rich plaque: Secondary | ICD-10-CM | POA: Diagnosis not present

## 2023-12-30 DIAGNOSIS — I10 Essential (primary) hypertension: Secondary | ICD-10-CM | POA: Diagnosis not present

## 2023-12-30 DIAGNOSIS — Z8546 Personal history of malignant neoplasm of prostate: Secondary | ICD-10-CM | POA: Diagnosis not present

## 2023-12-30 LAB — CBC WITH DIFFERENTIAL/PLATELET
Basophils Absolute: 0 K/uL (ref 0.0–0.1)
Basophils Relative: 0.3 % (ref 0.0–3.0)
Eosinophils Absolute: 0.1 K/uL (ref 0.0–0.7)
Eosinophils Relative: 0.6 % (ref 0.0–5.0)
HCT: 37.7 % — ABNORMAL LOW (ref 39.0–52.0)
Hemoglobin: 12.3 g/dL — ABNORMAL LOW (ref 13.0–17.0)
Lymphocytes Relative: 26.4 % (ref 12.0–46.0)
Lymphs Abs: 2.4 K/uL (ref 0.7–4.0)
MCHC: 32.5 g/dL (ref 30.0–36.0)
MCV: 80.1 fl (ref 78.0–100.0)
Monocytes Absolute: 0.8 K/uL (ref 0.1–1.0)
Monocytes Relative: 8.5 % (ref 3.0–12.0)
Neutro Abs: 5.9 K/uL (ref 1.4–7.7)
Neutrophils Relative %: 64.2 % (ref 43.0–77.0)
Platelets: 218 K/uL (ref 150.0–400.0)
RBC: 4.71 Mil/uL (ref 4.22–5.81)
RDW: 14.6 % (ref 11.5–15.5)
WBC: 9.2 K/uL (ref 4.0–10.5)

## 2023-12-30 LAB — TSH: TSH: 1.21 u[IU]/mL (ref 0.35–5.50)

## 2023-12-30 LAB — COMPREHENSIVE METABOLIC PANEL WITH GFR
ALT: 8 U/L (ref 0–53)
AST: 11 U/L (ref 0–37)
Albumin: 3.5 g/dL (ref 3.5–5.2)
Alkaline Phosphatase: 66 U/L (ref 39–117)
BUN: 17 mg/dL (ref 6–23)
CO2: 29 meq/L (ref 19–32)
Calcium: 8.8 mg/dL (ref 8.4–10.5)
Chloride: 103 meq/L (ref 96–112)
Creatinine, Ser: 1.07 mg/dL (ref 0.40–1.50)
GFR: 64.96 mL/min (ref >60.00)
Glucose, Bld: 94 mg/dL (ref 70–99)
Potassium: 3.7 meq/L (ref 3.5–5.1)
Sodium: 139 meq/L (ref 135–145)
Total Bilirubin: 0.6 mg/dL (ref 0.2–1.2)
Total Protein: 6.6 g/dL (ref 6.0–8.3)

## 2023-12-30 LAB — URINALYSIS
Bilirubin Urine: NEGATIVE
Hgb urine dipstick: NEGATIVE
Ketones, ur: NEGATIVE
Leukocytes,Ua: NEGATIVE
Nitrite: NEGATIVE
Specific Gravity, Urine: 1.02 (ref 1.000–1.030)
Total Protein, Urine: NEGATIVE
Urine Glucose: NEGATIVE
Urobilinogen, UA: 0.2 (ref 0.0–1.0)
pH: 6.5 (ref 5.0–8.0)

## 2023-12-30 LAB — T4, FREE: Free T4: 0.85 ng/dL (ref 0.60–1.60)

## 2023-12-30 LAB — SEDIMENTATION RATE: Sed Rate: 18 mm/h (ref 0–20)

## 2023-12-30 MED ORDER — AMOXICILLIN-POT CLAVULANATE 875-125 MG PO TABS: 1.0000 | ORAL_TABLET | Freq: Two times a day (BID) | ORAL | 0 refills | Status: AC

## 2023-12-30 MED ORDER — OLMESARTAN MEDOXOMIL 20 MG PO TABS: 20.0000 mg | ORAL_TABLET | Freq: Every day | ORAL | 3 refills | Status: AC

## 2023-12-30 NOTE — Assessment & Plan Note (Addendum)
 Worse On Amlodipine , Terazosyn Add Olmesartan  Labs

## 2023-12-30 NOTE — Assessment & Plan Note (Signed)
 F/u w/Urology Check PSA

## 2023-12-30 NOTE — Assessment & Plan Note (Signed)
 F/u w/Dr Nishan  On Eliquis , Norvasc  Take Fish oil

## 2023-12-30 NOTE — Assessment & Plan Note (Signed)
 Wt Readings from Last 3 Encounters:  12/30/23 174 lb (78.9 kg)  10/09/23 180 lb (81.6 kg)  05/29/23 181 lb (82.1 kg)  New - will monitor. Lost wt on diet first. No big appetite. Wife has a short term memory issue - worse

## 2023-12-30 NOTE — Assessment & Plan Note (Addendum)
 Discussed Wife has a short term memory issue - worse

## 2023-12-30 NOTE — Assessment & Plan Note (Signed)
 S/p ENT eval  Dr Okey moved

## 2023-12-30 NOTE — Assessment & Plan Note (Signed)
 Relapsing. Start Augmentin  x 2 weeks Check UA

## 2023-12-30 NOTE — Progress Notes (Signed)
 Subjective:  Patient ID: Dominic Shaffer, male    DOB: 02/02/1940  Age: 83 y.o. MRN: [id number]  CC: Annual Exam (Annual Exam)   HPI [name] presents for HTN, PUD, hoarseness. S/p ENT eval  No big appetite. Wife has a short term memory issue - worse C/o prostatitis sx's x 4 wks  Outpatient Medications Prior to Visit  Medication Sig Dispense Refill   amLODipine  (NORVASC ) 5 MG tablet TAKE 1 TABLET EVERY DAY 90 tablet 3   apixaban  (ELIQUIS ) 5 MG TABS tablet Take 1 tablet (5 mg total) by mouth 2 (two) times daily. 180 tablet 3   diclofenac  Sodium (VOLTAREN ) 1 % GEL Apply 2 g topically 4 (four) times daily. To affected joint. 150 g 11   diphenhydrAMINE  (BENADRYL  ALLERGY) 25 mg capsule Patient to take 2 tablets one hour prior to test 1 capsule 0   fluticasone  (FLONASE ) 50 MCG/ACT nasal spray Place 2 sprays into both nostrils daily. 16 g 6   furosemide  (LASIX ) 20 MG tablet Take 1 tablet (20 mg total) by mouth daily as needed. FOR SWELLING 30 tablet 6   hydrocortisone  2.5 % cream Apply topically 2 (two) times daily. 40 g 1   IRON PO Take 65 mg by mouth daily.     Multiple Vitamin (MULTIVITAMIN) tablet Take 1 tablet by mouth daily.     pantoprazole  (PROTONIX ) 40 MG tablet TAKE 1 TABLET EVERY DAY 90 tablet 3   terazosin  (HYTRIN ) 10 MG capsule TAKE 1 CAPSULE AT BEDTIME 90 capsule 3   triamcinolone  cream (KENALOG ) 0.5 % APPLY  CREAM TOPICALLY THREE TIMES DAILY 45 g 0   famotidine  (PEPCID ) 40 MG tablet Take 1 tablet (40 mg total) by mouth daily. (Patient not taking: Reported on 12/30/2023) 90 tablet 1   No facility-administered medications prior to visit.    ROS: Review of Systems  Constitutional:  Positive for unexpected weight change. Negative for appetite change and fatigue.  HENT:  Positive for voice change. Negative for congestion, nosebleeds, sneezing, sore throat and trouble swallowing.   Eyes:  Negative for itching and visual disturbance.  Respiratory:  Negative for  cough and shortness of breath.   Cardiovascular:  Negative for chest pain, palpitations and leg swelling.  Gastrointestinal:  Negative for abdominal distention, blood in stool, diarrhea and nausea.  Genitourinary:  Positive for frequency and urgency. Negative for hematuria.  Musculoskeletal:  Negative for back pain, gait problem, joint swelling and neck pain.  Skin:  Negative for rash.  Neurological:  Negative for dizziness, tremors, speech difficulty and weakness.  Psychiatric/Behavioral:  Negative for agitation, dysphoric mood and sleep disturbance. The patient is not nervous/anxious.     Objective:  BP (!) 160/80 (Cuff Size: Normal)   Pulse 68   Ht 5' 10 (1.778 m)   Wt 174 lb (78.9 kg)   SpO2 99%   BMI 24.97 kg/m   BP Readings from Last 3 Encounters:  12/30/23 (!) 160/80  09/12/23 (!) 150/66  05/29/23 (!) 122/48    Wt Readings from Last 3 Encounters:  12/30/23 174 lb (78.9 kg)  10/09/23 180 lb (81.6 kg)  05/29/23 181 lb (82.1 kg)    Physical Exam Constitutional:      General: He is not in acute distress.    Appearance: Normal appearance. He is well-developed.     Comments: NAD  Eyes:     Conjunctiva/sclera: Conjunctivae normal.     Pupils: Pupils are equal, round, and reactive to light.  Neck:  Thyroid : No thyromegaly.     Vascular: No JVD.  Cardiovascular:     Rate and Rhythm: Normal rate and regular rhythm.     Heart sounds: Normal heart sounds. No murmur heard.    No friction rub. No gallop.  Pulmonary:     Effort: Pulmonary effort is normal. No respiratory distress.     Breath sounds: Normal breath sounds. No wheezing or rales.  Chest:     Chest wall: No tenderness.  Abdominal:     General: Bowel sounds are normal. There is no distension.     Palpations: Abdomen is soft. There is no mass.     Tenderness: There is no abdominal tenderness. There is no guarding or rebound.  Genitourinary:    Rectum: Guaiac result negative.  Musculoskeletal:         General: No tenderness. Normal range of motion.     Cervical back: Normal range of motion.  Lymphadenopathy:     Cervical: No cervical adenopathy.  Skin:    General: Skin is warm and dry.     Findings: No rash.  Neurological:     Mental Status: He is alert and oriented to person, place, and time.     Cranial Nerves: No cranial nerve deficit.     Motor: No abnormal muscle tone.     Coordination: Coordination normal.     Gait: Gait normal.     Deep Tendon Reflexes: Reflexes are normal and symmetric.  Psychiatric:        Behavior: Behavior normal.        Thought Content: Thought content normal.        Judgment: Judgment normal.   Thinner Voice is less deep  Lab Results  Component Value Date   WBC 7.9 05/30/2023   HGB 12.3 (L) 05/30/2023   HCT 38.3 (L) 05/30/2023   PLT 228.0 05/30/2023   GLUCOSE 104 (H) 05/30/2023   CHOL 121 05/30/2023   TRIG 82.0 05/30/2023   HDL 32.60 (L) 05/30/2023   LDLCALC 72 05/30/2023   ALT 10 05/30/2023   AST 12 05/30/2023   NA 140 05/30/2023   K 4.1 05/30/2023   CL 106 05/30/2023   CREATININE 0.99 05/30/2023   BUN 15 05/30/2023   CO2 25 05/30/2023   TSH 1.14 07/09/2022   PSA 0.01 (L) 05/30/2023   INR 0.99 10/25/2016    CT Abdomen Pelvis W Contrast Result Date: 03/15/2021 CLINICAL DATA:  Two month history of low abdominal pain. EXAM: CT ABDOMEN AND PELVIS WITH CONTRAST TECHNIQUE: Multidetector CT imaging of the abdomen and pelvis was performed using the standard protocol following bolus administration of intravenous contrast. RADIATION DOSE REDUCTION: This exam was performed according to the departmental dose-optimization program which includes automated exposure control, adjustment of the mA and/or kV according to patient size and/or use of iterative reconstruction technique. CONTRAST:  OMNIPAQUE  IOHEXOL  300 MG/ML  SOLN COMPARISON:  08/10/2014 FINDINGS: Lower chest: No acute abnormality. Hepatobiliary: Right lobe of liver cyst measures 1.3  cm. Small low attenuation structure within the periphery of the right hepatic lobe measures 4 mm, image 16/2. This is technically too small to reliably characterize. No suspicious liver lesions identified at this time. Gallbladder appears normal. No bile duct dilatation. Pancreas: Unremarkable. No pancreatic ductal dilatation or surrounding inflammatory changes. Spleen: Normal in size without focal abnormality. Adrenals/Urinary Tract: Normal adrenal glands. There is a cyst arising off the posterior cortex of the interpolar left kidney measuring 4.1 cm. There is a focal area of  cortical scarring within the inferior pole of the left kidney, image 29/2. There are 2 stones within the inferior pole collecting system of the left kidney which measure up to 3 mm. No hydronephrosis or hydroureter identified bilaterally. Urinary bladder is unremarkable. Stomach/Bowel: The distal esophagus appears markedly dilated compatible with clinical history of achalasia, image 1/2. Stomach appears normal. The appendix is visualized and is within normal limits. Scattered small distal colonic diverticula noted without signs of acute inflammation. Vascular/Lymphatic: Aortic atherosclerosis. No aneurysm. No abdominopelvic adenopathy. Reproductive: Seed implants identified within the prostate gland. Other: No free fluid or fluid collections. There is a right inguinal hernia which contains fat and a nonobstructed loop of small bowel, image 74/2. Musculoskeletal: There is lumbar spondylosis. First degree anterolisthesis of L4 on L5 noted. IMPRESSION: 1. No acute findings within the abdomen or pelvis. 2. Right inguinal hernia contains fat and a nonobstructed loop of small bowel. 3. Markedly dilated distal esophagus compatible with clinical history of achalasia. 4. Nonobstructing left renal calculi. 5. Aortic Atherosclerosis (ICD10-I70.0). Electronically Signed   By: Waddell Calk M.D.   On: 03/15/2021 08:30    Assessment & Plan:   Problem  List Items Addressed This Visit     CAD (coronary artery disease)   F/u w/Dr Delford  On Eliquis , Norvasc  Take Fish oil      Relevant Medications   olmesartan  (BENICAR ) 20 MG tablet   DVT of deep femoral vein (HCC)   H/o DVT x3 On Eliquis       Relevant Medications   olmesartan  (BENICAR ) 20 MG tablet   Essential hypertension   Worse On Amlodipine , Terazosyn Add Olmesartan  Labs      Relevant Medications   olmesartan  (BENICAR ) 20 MG tablet   Other Relevant Orders   Urinalysis   Sedimentation rate   CBC with Differential/Platelet   Comprehensive metabolic panel with GFR   T4, free   TSH   Hoarseness   S/p ENT eval  Dr Okey moved      Malignant neoplasm of prostate (HCC)   F/u w/Urology Check PSA      Relevant Medications   amoxicillin -clavulanate (AUGMENTIN ) 875-125 MG tablet   Prostatitis - Primary   Relapsing. Start Augmentin  x 2 weeks Check UA      Relevant Orders   Urinalysis   Sedimentation rate   CBC with Differential/Platelet   Comprehensive metabolic panel with GFR   T4, free   TSH   Stress   Discussed Wife has a short term memory issue - worse      Weight loss   Wt Readings from Last 3 Encounters:  12/30/23 174 lb (78.9 kg)  10/09/23 180 lb (81.6 kg)  05/29/23 181 lb (82.1 kg)  New - will monitor. Lost wt on diet first. No big appetite. Wife has a short term memory issue - worse       Relevant Orders   Urinalysis   Sedimentation rate   CBC with Differential/Platelet   Comprehensive metabolic panel with GFR   T4, free   TSH   DG Chest 2 View      Meds ordered this encounter  Medications   amoxicillin -clavulanate (AUGMENTIN ) 875-125 MG tablet    Sig: Take 1 tablet by mouth 2 (two) times daily.    Dispense:  28 tablet    Refill:  0   olmesartan  (BENICAR ) 20 MG tablet    Sig: Take 1 tablet (20 mg total) by mouth daily.    Dispense:  90 tablet  Refill:  3      Follow-up: Return in about 2 months (around 03/01/2024) for  a follow-up visit.  Marolyn Noel, MD

## 2023-12-30 NOTE — Assessment & Plan Note (Signed)
 H/o DVT x3 On Eliquis 

## 2023-12-31 ENCOUNTER — Ambulatory Visit: Payer: Self-pay | Admitting: Internal Medicine

## 2024-01-13 NOTE — Progress Notes (Unsigned)
 Chief Complaint: Primary GI MD: Dr. Abran  HPI: 83 year old male history of LBBB, hypertension, esophageal stricture, DVT on Eliquis , DCM with EF 45 to 50% on echo 2012 but normal on TTE 12/2018, CAD, presents for evaluation of   Discussed the use of AI scribe software for clinical note transcription with the patient, who gave verbal consent to proceed.  History of Present Illness      PREVIOUS GI WORKUP     Past Medical History:  Diagnosis Date   Asymptomatic stenosis of left carotid artery without infarction    per duplex LICA 40-59% (07-11-2011)   At risk for sleep apnea    STOP-BANG= 4     SENT TO PCP 12-23-2013   BPH with urinary obstruction    hyperplasia   CAD (coronary artery disease) 03/10/2018   Dr Delford     Low risk cardiac CT 2012:        1)    Calcium Score 26 isolated area in mid LAD          2)    <50% stenosis in mid LAD involving ostium of D1  3)    Distal circumflex and PDA not well seen  Large right sided hiatal hernia with basilar atelectasis  Original Report Authenticated By: WADDELL HILARIO CALK, M.D.     Take Fish oil     Colon polyps    Congenital renal cyst, single    Coronary artery disease    cardiologist--  dr delford-- myoview  07-05-2010 poss. small inferior wall infact/  no ischemia/ ef 51%/  cardic CT score 28 and <50% LAD/D! disease   Depression    Over 25 years ago   Diverticulitis    ED (erectile dysfunction)    secondary arterial insuffiency   Esophageal achalasia    s/p multiple dilatations and botox injection tx and surgical intervention 06-17-2011  Heller Myotomy   Hematospermia    History of DVT of lower extremity    06/ 2012   History of esophageal dilatation    and botox injection tx's at [facility]   Hypertension    Internal hemorrhoid 2003   Dr. Abran   LBBB (left bundle branch block)    LBP (low back pain)    Nocturia    Prostate cancer [facility])    Wears glasses     Past Surgical History:  Procedure Laterality Date    CARDIOVASCULAR STRESS TEST  07-05-2010  dr delford   Adenosine  nuclear study/  no significant ST segment change suggestive of ischemia/  possible small inferior wall infarct at mid and basal level/ ef 51%/  LBBB/  normal wall motion   COLONOSCOPY  last one 2009   HERNIA REPAIR     INGUINAL HERNIA REPAIR Left 10/13/2012   Procedure: OPEN LEFT INGUINAL HERNIA REPAIR WITH ON Q PUMP;  Surgeon: Krystal JINNY Russell, MD;  Location: [facility];  Service: General;  Laterality: Left;   INSERTION OF MESH Left 10/13/2012   Procedure: INSERTION OF MESH;  Surgeon: Krystal JINNY Russell, MD;  Location: [facility];  Service: General;  Laterality: Left;   IR RADIOLOGIST EVAL & MGMT  10/11/2016   LAPAROSCOPIC HELLER MYOTOMY  06/16/2001   w/ intraoperative upper endoscopy [place] (for achalasia)   RADIOACTIVE SEED IMPLANT N/A 12/31/2013   Procedure: RADIOACTIVE SEED IMPLANT;  Surgeon: Norleen JINNY Seltzer, MD;  [facility]  Service: Urology;  Laterality: N/A;  seeds implanted 78 no seeds found in bladder   TRANSTHORACIC ECHOCARDIOGRAM  07/05/2010   mild LVH/ septal motion consistent w/ LBBB/ ef 45-50%/  diffuse hypokinesis/  grade I diastolic dysfunction/ mild LAE/  trivial TR   UMBILICAL HERNIA REPAIR  11/11/2005    Current Outpatient Medications  Medication Sig Dispense Refill   amLODipine  (NORVASC ) 5 MG tablet TAKE 1 TABLET EVERY DAY 90 tablet 3   amoxicillin -clavulanate (AUGMENTIN ) 875-125 MG tablet Take 1 tablet by mouth 2 (two) times daily. 28 tablet 0   apixaban  (ELIQUIS ) 5 MG TABS tablet Take 1 tablet (5 mg total) by mouth 2 (two) times daily. 180 tablet 3   diclofenac  Sodium (VOLTAREN ) 1 % GEL Apply 2 g topically 4 (four) times daily. To affected joint. 150 g 11   diphenhydrAMINE  (BENADRYL  ALLERGY) 25 mg capsule Patient to take 2 tablets one hour prior to test 1 capsule 0   famotidine  (PEPCID ) 40 MG tablet Take 1 tablet (40 mg total) by mouth daily. (Patient not taking: Reported on 12/30/2023) 90  tablet 1   fluticasone  (FLONASE ) 50 MCG/ACT nasal spray Place 2 sprays into both nostrils daily. 16 g 6   furosemide  (LASIX ) 20 MG tablet Take 1 tablet (20 mg total) by mouth daily as needed. FOR SWELLING 30 tablet 6   hydrocortisone  2.5 % cream Apply topically 2 (two) times daily. 40 g 1   IRON PO Take 65 mg by mouth daily.     Multiple Vitamin (MULTIVITAMIN) tablet Take 1 tablet by mouth daily.     olmesartan  (BENICAR ) 20 MG tablet Take 1 tablet (20 mg total) by mouth daily. 90 tablet 3   pantoprazole  (PROTONIX ) 40 MG tablet TAKE 1 TABLET EVERY DAY 90 tablet 3   terazosin  (HYTRIN ) 10 MG capsule TAKE 1 CAPSULE AT BEDTIME 90 capsule 3   triamcinolone  cream (KENALOG ) 0.5 % APPLY  CREAM TOPICALLY THREE TIMES DAILY 45 g 0   No current facility-administered medications for this visit.    Allergies as of 01/14/2024 - Review Complete 12/30/2023  Allergen Reaction Noted   Iodinated contrast media Hives 05/24/2010   Shellfish allergy Hives 10/09/2012   Ciprofloxacin   12/30/2023    Family History  Problem Relation Age of Onset   Heart disease Mother 42       CAD   Kidney disease Mother    Cancer Father 73       bladder ca; passed in 1999   Bladder Cancer Father    Hypertension Other    Colon cancer Neg Hx    Esophageal cancer Neg Hx    Prostate cancer Neg Hx    Pancreatic cancer Neg Hx    Stomach cancer Neg Hx     Social History   Socioeconomic History   Marital status: Married    Spouse name: Not on file   Number of children: 3   Years of education: Not on file   Highest education level: Bachelor's degree (e.g., BA, AB, BS)  Occupational History   Occupation: HR  Tobacco Use   Smoking status: Former    Current packs/day: 0.00    Average packs/day: 0.3 packs/day for 10.1 years (2.5 ttl pk-yrs)    Types: Cigarettes    Start date: 01/02/1965    Quit date: 07/03/1965    Years since quitting: 58.5   Smokeless tobacco: Never   Tobacco comments:    Smoked briefly for 5 months,  quit in 1967  Vaping Use   Vaping status: Never Used  Substance and Sexual Activity   Alcohol use: Never   Drug use:  Never   Sexual activity: Yes    Comment: one pack of cigarettes would last a week  Other Topics Concern   Not on file  Social History Narrative   Regular exercise - yes   Married   Social Drivers of Health   Tobacco Use: Medium Risk (12/30/2023)   Patient History    Smoking Tobacco Use: Former    Smokeless Tobacco Use: Never    Passive Exposure: Not on file  Financial Resource Strain: Low Risk (12/28/2023)   Overall Financial Resource Strain (CARDIA)    Difficulty of Paying Living Expenses: Not very hard  Food Insecurity: Food Insecurity Present (12/28/2023)   Epic    Worried About Programme Researcher, Broadcasting/film/video in the Last Year: Sometimes true    Ran Out of Food in the Last Year: Never true  Transportation Needs: No Transportation Needs (12/28/2023)   Epic    Lack of Transportation (Medical): No    Lack of Transportation (Non-Medical): No  Physical Activity: Insufficiently Active (12/28/2023)   Exercise Vital Sign    Days of Exercise per Week: 1 day    Minutes of Exercise per Session: 20 min  Stress: No Stress Concern Present (12/28/2023)   [employer] of Occupational Health - Occupational Stress Questionnaire    Feeling of Stress: Only a little  Social Connections: Moderately Integrated (12/28/2023)   Social Connection and Isolation Panel    Frequency of Communication with Friends and Family: Once a week    Frequency of Social Gatherings with Friends and Family: Once a week    Attends Religious Services: More than 4 times per year    Active Member of [employer] or Organizations: Yes    Attends Engineer, Structural: More than 4 times per year    Marital Status: Married  Recent Concern: Social Connections - Moderately Isolated (10/09/2023)   Social Connection and Isolation Panel    Frequency of Communication with Friends and Family: Once a week     Frequency of Social Gatherings with Friends and Family: Once a week    Attends Religious Services: More than 4 times per year    Active Member of [employer] or Organizations: No    Attends Banker Meetings: Never    Marital Status: Married  Catering Manager Violence: Not At Risk (10/09/2023)   Epic    Fear of Current or Ex-Partner: No    Emotionally Abused: No    Physically Abused: No    Sexually Abused: No  Depression (PHQ2-9): Low Risk (10/09/2023)   Depression (PHQ2-9)    PHQ-2 Score: 0  Alcohol Screen: Low Risk (10/09/2023)   Alcohol Screen    Last Alcohol Screening Score (AUDIT): 0  Housing: Unknown (12/28/2023)   Epic    Unable to Pay for Housing in the Last Year: No    Number of Times Moved in the Last Year: Not on file    Homeless in the Last Year: No  Utilities: Not At Risk (10/09/2023)   Epic    Threatened with loss of utilities: No  Health Literacy: Adequate Health Literacy (10/09/2023)   B1300 Health Literacy    Frequency of need for help with medical instructions: Never    Review of Systems:    Constitutional: No weight loss, fever, chills, weakness or fatigue HEENT: Eyes: No change in vision               Ears, Nose, Throat:  No change in hearing or congestion Skin: No rash or itching  Cardiovascular: No chest pain, chest pressure or palpitations   Respiratory: No SOB or cough Gastrointestinal: See HPI and otherwise negative Genitourinary: No dysuria or change in urinary frequency Neurological: No headache, dizziness or syncope Musculoskeletal: No new muscle or joint pain Hematologic: No bleeding or bruising Psychiatric: No history of depression or anxiety    Physical Exam:  Vital signs: There were no vitals taken for this visit.  Constitutional: NAD, alert and cooperative Head:  Normocephalic and atraumatic. Eyes:   PEERL, EOMI. No icterus. Conjunctiva pink. Respiratory: Respirations even and unlabored. Lungs clear to auscultation bilaterally.   No  wheezes, crackles, or rhonchi.  Cardiovascular:  Regular rate and rhythm. No peripheral edema, cyanosis or pallor.  Gastrointestinal:  Soft, nondistended, nontender. No rebound or guarding. Normal bowel sounds. No appreciable masses or hepatomegaly. Rectal:  Declines Msk:  Symmetrical without gross deformities. Without edema, no deformity or joint abnormality.  Neurologic:  Alert and  oriented x4;  grossly normal neurologically.  Skin:   Dry and intact without significant lesions or rashes. Psychiatric: Oriented to person, place and time. Demonstrates good judgement and reason without abnormal affect or behaviors.  Physical Exam    RELEVANT LABS AND IMAGING: CBC    Component Value Date/Time   WBC 9.2 12/30/2023 1418   RBC 4.71 12/30/2023 1418   HGB 12.3 (L) 12/30/2023 1418   HCT 37.7 (L) 12/30/2023 1418   PLT 218.0 12/30/2023 1418   MCV 80.1 12/30/2023 1418   MCH 25.8 (L) 09/24/2019 0836   MCHC 32.5 12/30/2023 1418   RDW 14.6 12/30/2023 1418   LYMPHSABS 2.4 12/30/2023 1418   MONOABS 0.8 12/30/2023 1418   EOSABS 0.1 12/30/2023 1418   BASOSABS 0.0 12/30/2023 1418    CMP     Component Value Date/Time   NA 139 12/30/2023 1418   K 3.7 12/30/2023 1418   CL 103 12/30/2023 1418   CO2 29 12/30/2023 1418   GLUCOSE 94 12/30/2023 1418   BUN 17 12/30/2023 1418   CREATININE 1.07 12/30/2023 1418   CREATININE 1.01 09/24/2019 0835   CALCIUM 8.8 12/30/2023 1418   PROT 6.6 12/30/2023 1418   ALBUMIN 3.5 12/30/2023 1418   AST 11 12/30/2023 1418   ALT 8 12/30/2023 1418   ALKPHOS 66 12/30/2023 1418   BILITOT 0.6 12/30/2023 1418   GFRNONAA 71 09/24/2019 0835   GFRAA 83 09/24/2019 0835     Assessment/Plan:   Assessment and Plan Assessment & Plan        Dominic Shaffer [facility] 01/13/2024, 12:52 PM  Cc: Plotnikov, Aleksei V, MD

## 2024-01-14 ENCOUNTER — Ambulatory Visit: Admitting: Gastroenterology

## 2024-01-14 ENCOUNTER — Encounter: Payer: Self-pay | Admitting: Gastroenterology

## 2024-01-14 VITALS — BP 116/60 | HR 70 | Ht 67.5 in | Wt 174.5 lb

## 2024-01-14 DIAGNOSIS — Z9889 Other specified postprocedural states: Secondary | ICD-10-CM

## 2024-01-14 DIAGNOSIS — Z8719 Personal history of other diseases of the digestive system: Secondary | ICD-10-CM

## 2024-01-14 DIAGNOSIS — R634 Abnormal weight loss: Secondary | ICD-10-CM

## 2024-01-14 DIAGNOSIS — I82412 Acute embolism and thrombosis of left femoral vein: Secondary | ICD-10-CM

## 2024-01-14 DIAGNOSIS — I2510 Atherosclerotic heart disease of native coronary artery without angina pectoris: Secondary | ICD-10-CM

## 2024-01-14 DIAGNOSIS — I447 Left bundle-branch block, unspecified: Secondary | ICD-10-CM

## 2024-01-14 DIAGNOSIS — K220 Achalasia of cardia: Secondary | ICD-10-CM

## 2024-01-14 NOTE — Progress Notes (Signed)
 Noted

## 2024-02-05 ENCOUNTER — Other Ambulatory Visit (HOSPITAL_BASED_OUTPATIENT_CLINIC_OR_DEPARTMENT_OTHER): Payer: Self-pay

## 2024-02-05 MED ORDER — FLUZONE HIGH-DOSE 0.5 ML IM SUSY
0.5000 mL | PREFILLED_SYRINGE | Freq: Once | INTRAMUSCULAR | 0 refills | Status: AC
  Filled 2024-02-05: qty 0.5, 1d supply, fill #0

## 2024-02-17 ENCOUNTER — Other Ambulatory Visit (HOSPITAL_COMMUNITY): Payer: Self-pay

## 2024-10-13 ENCOUNTER — Ambulatory Visit
